# Patient Record
Sex: Female | Born: 1980 | Race: Black or African American | Hispanic: No | Marital: Single | Smoking: Never smoker
Health system: Southern US, Community
[De-identification: ages and names within clinical notes are randomized; demographics above are authoritative.]

## PROBLEM LIST (undated history)

## (undated) ENCOUNTER — Emergency Department (HOSPITAL_COMMUNITY): Admission: EM | Payer: BLUE CROSS/BLUE SHIELD | Source: Home / Self Care

## (undated) DIAGNOSIS — R011 Cardiac murmur, unspecified: Secondary | ICD-10-CM

## (undated) DIAGNOSIS — J329 Chronic sinusitis, unspecified: Secondary | ICD-10-CM

## (undated) DIAGNOSIS — J45909 Unspecified asthma, uncomplicated: Secondary | ICD-10-CM

## (undated) HISTORY — PX: TUBAL LIGATION: SHX77

## (undated) HISTORY — DX: Chronic sinusitis, unspecified: J32.9

## (undated) HISTORY — PX: CARPAL TUNNEL RELEASE: SHX101

## (undated) HISTORY — PX: OTHER SURGICAL HISTORY: SHX169

---

## 2000-10-08 ENCOUNTER — Ambulatory Visit (HOSPITAL_COMMUNITY): Admission: RE | Admit: 2000-10-08 | Discharge: 2000-10-08 | Payer: Self-pay | Admitting: *Deleted

## 2000-12-03 ENCOUNTER — Ambulatory Visit (HOSPITAL_COMMUNITY): Admission: RE | Admit: 2000-12-03 | Discharge: 2000-12-03 | Payer: Self-pay | Admitting: *Deleted

## 2000-12-27 ENCOUNTER — Encounter (INDEPENDENT_AMBULATORY_CARE_PROVIDER_SITE_OTHER): Payer: Self-pay

## 2000-12-27 ENCOUNTER — Inpatient Hospital Stay (HOSPITAL_COMMUNITY): Admission: AD | Admit: 2000-12-27 | Discharge: 2000-12-29 | Payer: Self-pay | Admitting: *Deleted

## 2002-03-04 ENCOUNTER — Emergency Department (HOSPITAL_COMMUNITY): Admission: EM | Admit: 2002-03-04 | Discharge: 2002-03-04 | Payer: Self-pay | Admitting: Emergency Medicine

## 2002-03-04 ENCOUNTER — Encounter: Payer: Self-pay | Admitting: Emergency Medicine

## 2004-03-22 ENCOUNTER — Emergency Department (HOSPITAL_COMMUNITY): Admission: EM | Admit: 2004-03-22 | Discharge: 2004-03-22 | Payer: Self-pay | Admitting: Emergency Medicine

## 2004-09-03 ENCOUNTER — Emergency Department (HOSPITAL_COMMUNITY): Admission: EM | Admit: 2004-09-03 | Discharge: 2004-09-03 | Payer: Self-pay | Admitting: Emergency Medicine

## 2005-02-20 ENCOUNTER — Emergency Department (HOSPITAL_COMMUNITY): Admission: EM | Admit: 2005-02-20 | Discharge: 2005-02-21 | Payer: Self-pay | Admitting: Emergency Medicine

## 2005-06-05 ENCOUNTER — Emergency Department (HOSPITAL_COMMUNITY): Admission: EM | Admit: 2005-06-05 | Discharge: 2005-06-05 | Payer: Self-pay | Admitting: Emergency Medicine

## 2005-08-06 ENCOUNTER — Emergency Department (HOSPITAL_COMMUNITY): Admission: EM | Admit: 2005-08-06 | Discharge: 2005-08-07 | Payer: Self-pay | Admitting: Emergency Medicine

## 2005-11-09 ENCOUNTER — Emergency Department (HOSPITAL_COMMUNITY): Admission: EM | Admit: 2005-11-09 | Discharge: 2005-11-09 | Payer: Self-pay | Admitting: Emergency Medicine

## 2006-07-30 ENCOUNTER — Emergency Department (HOSPITAL_COMMUNITY): Admission: EM | Admit: 2006-07-30 | Discharge: 2006-07-30 | Payer: Self-pay | Admitting: Emergency Medicine

## 2007-05-24 ENCOUNTER — Inpatient Hospital Stay (HOSPITAL_COMMUNITY): Admission: EM | Admit: 2007-05-24 | Discharge: 2007-05-26 | Payer: Self-pay | Admitting: Emergency Medicine

## 2007-05-26 ENCOUNTER — Ambulatory Visit: Payer: Self-pay | Admitting: Dentistry

## 2007-11-21 ENCOUNTER — Emergency Department (HOSPITAL_COMMUNITY): Admission: EM | Admit: 2007-11-21 | Discharge: 2007-11-21 | Payer: Self-pay | Admitting: Emergency Medicine

## 2008-09-26 ENCOUNTER — Emergency Department (HOSPITAL_COMMUNITY): Admission: EM | Admit: 2008-09-26 | Discharge: 2008-09-26 | Payer: Self-pay | Admitting: Emergency Medicine

## 2008-10-05 ENCOUNTER — Emergency Department (HOSPITAL_COMMUNITY): Admission: EM | Admit: 2008-10-05 | Discharge: 2008-10-05 | Payer: Self-pay | Admitting: Emergency Medicine

## 2009-02-27 ENCOUNTER — Emergency Department (HOSPITAL_COMMUNITY): Admission: EM | Admit: 2009-02-27 | Discharge: 2009-02-27 | Payer: Self-pay | Admitting: Emergency Medicine

## 2009-06-22 ENCOUNTER — Emergency Department (HOSPITAL_COMMUNITY): Admission: EM | Admit: 2009-06-22 | Discharge: 2009-06-23 | Payer: Self-pay | Admitting: Emergency Medicine

## 2009-12-31 ENCOUNTER — Emergency Department (HOSPITAL_COMMUNITY): Admission: EM | Admit: 2009-12-31 | Discharge: 2009-12-31 | Payer: Self-pay | Admitting: Emergency Medicine

## 2010-05-04 LAB — POCT I-STAT, CHEM 8
Calcium, Ion: 1.12 mmol/L (ref 1.12–1.32)
Chloride: 108 mEq/L (ref 96–112)
Creatinine, Ser: 1 mg/dL (ref 0.4–1.2)
Glucose, Bld: 104 mg/dL — ABNORMAL HIGH (ref 70–99)
Potassium: 4 mEq/L (ref 3.5–5.1)

## 2010-05-04 LAB — DIFFERENTIAL
Eosinophils Absolute: 0 10*3/uL (ref 0.0–0.7)
Lymphs Abs: 1.1 10*3/uL (ref 0.7–4.0)
Monocytes Absolute: 0.3 10*3/uL (ref 0.1–1.0)
Monocytes Relative: 10 % (ref 3–12)
Neutro Abs: 2 10*3/uL (ref 1.7–7.7)
Neutrophils Relative %: 59 % (ref 43–77)

## 2010-05-04 LAB — CBC
Hemoglobin: 13.3 g/dL (ref 12.0–15.0)
MCV: 75.6 fL — ABNORMAL LOW (ref 78.0–100.0)
RBC: 5.38 MIL/uL — ABNORMAL HIGH (ref 3.87–5.11)
WBC: 3.4 10*3/uL — ABNORMAL LOW (ref 4.0–10.5)

## 2010-05-24 LAB — URINE MICROSCOPIC-ADD ON

## 2010-05-24 LAB — URINALYSIS, ROUTINE W REFLEX MICROSCOPIC
Bilirubin Urine: NEGATIVE
Specific Gravity, Urine: 1.017 (ref 1.005–1.030)
pH: 5.5 (ref 5.0–8.0)

## 2010-05-24 LAB — RAPID STREP SCREEN (MED CTR MEBANE ONLY): Streptococcus, Group A Screen (Direct): NEGATIVE

## 2010-07-01 NOTE — Consult Note (Signed)
NAMEBRAILEY, BUESCHER               ACCOUNT NO.:  [id number]   MEDICAL RECORD NO.:  [id number]          PATIENT TYPE:  INP   LOCATION:  5011                         FACILITY:  [facility]   PHYSICIAN:  Charlynne Pander, D.D.S.DATE OF BIRTH:  1980-04-01   DATE OF CONSULTATION:  DATE OF DISCHARGE:  05/26/2007                                 CONSULTATION   Amy Guerrero is a 30 year old female referred by Dr. Flonnie Overman for dental  consultation.  The patient was recently admitted with a history of neck  swelling and some dysphagia.  Dental consultation requested to evaluate  toothache symptoms as well.  The patient is now seen as part of dental  consultation service to evaluate dentition and to rule out dental  infection that may affect the patient's systemic health.   MEDICAL HISTORY:  1. Right facial and neck swelling with a history of toothache.   ALLERGIES:  None known.   MEDICATIONS:  1. Aspirin 325 mg daily.  2. Chlorhexidine rinses twice daily.  3. Clindamycin 300 mg IV every 6 hours.  4. Lovenox 40 mg daily.  5. Protonix 40 mg daily.   SOCIAL HISTORY:  The patient is single.  The patient is a nonsmoker.  The patient with rare use of alcohol.   FAMILY HISTORY:  Mother is alive at age 82 with a history of diabetes  mellitus.  Father is alive at age 96 with a history of diabetes  mellitus.   FUNCTIONAL ASSESSMENT:  The patient was independent for ADLs prior to  this admission.   REVIEW OF SYSTEMS:  This was reviewed from the chart and health history  assessment form for this admission.   DENTAL HISTORY:   CHIEF COMPLAINT:  Dental consultation requested to evaluate mandibular  right toothache.   HISTORY OF PRESENT ILLNESS:  The patient with a history of toothache  symptoms that have been lasting for approximately 7 days.  The patient  was recently admitted on May 24, 2007 with the administration of IV  antibiotic therapy when dental consultation was requested.  The patient  is now seen to evaluate toothache symptoms and evaluate treatment  options at this time.   The patient gives a history of mandibular right third molar tooth pain  for approximately 7 days.  The patient indicates that it has been  hurting off and on for the past 1-2 years by her report.  The patient  indicates that recently the pain has been 10/10 in intensity, but is now  5-6/10 intensity with pain medication and IV antibiotic therapy.  The  patient indicates that she has not seen a dentist for a long time.  The patient indicates that it is approximately 5-10 years since she was  last seen by a dentist.   DENTAL EXAMINATION:  GENERAL:  The patient is a well-developed, well-  nourished female in no acute distress.  VITAL SIGNS:  Blood pressure is 115/72, pulse is 80, respirations are  20, temperature is 97.1.  HEAD AND NECK EXAM:  There is a right and left neck submandibular  lymphadenopathy.  The patient denies acute TMJ  symptoms.  The patient  does have limited opening with maximum internal incisal opening measured  at approximately 20 mm.  This has been a getting better over the past  several days of IV antibiotic therapy.  INTRAORAL EXAM:  The patient with a normal saliva.  There are some  plaque calculus accumulations noted.  There is no evidence of abscess  formation within the mouth.  DENTITION:  The patient has all remaining teeth present.  Tooth #1 one  and 16 have severe caries and are basically root segments.  PERIODONTAL:  The patient with chronic periodontitis with incipient bone  loss.  There is plaque and calculus accumulations that are noted.  No  significant tooth mobility is noted.  DENTAL CARIES:  There appears to be significant dental caries involving  teeth #1, #16, #17, and at 32.  I would need a full series dental  radiographs to identify the extent of the other incipient dental caries.  ENDODONTIC:  The patient with a history of acute pulpitis symptoms   involving the mandibular right third molar.  The patient appears to have  periapical pathology involving teeth #17 and #32 at this time.  The  patient currently is not complaining of toothache symptoms associated  with retained roots in the area of teeth #1 and #16.  CROWN AND BRIDGE:  There are no crown or bridge restorations.  PROATHODONTIC:  There are no dentures.  OCCLUSION:  The patient with a stable occlusion at this time.   Radiographic interpretation.  A panoramic x-ray was taken on May 25, 2007.  There are retained roots in the area of teeth #1 and #16 associated with  these third molars.  There is significant dental caries and noted to be  affecting teeth #1, #16, #17, and #32.  There is a significant  radiolucency at the apex of teeth #17 and #32.  There is incipient bone  loss.  There is a presence of tongue piercings with retained metal  objects noted on the ortho panoramic x-ray.   ASSESSMENT:  1. History of acute pulpitis symptoms involving the lower right      mandibular third molar #17.  2. Periapical pathology associated with teeth #17 and #32.  3. Retained roots of teeth #1 and #16 with extensive dental caries.  4. Significant dental caries involving teeth #1, #16, #17, and #32      with possible other incipient dental caries noted.  Full series of      dental radiographs is recommended by primary dentist of her choice.  5. Stable occlusion.  6. 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  7. Current IV antibiotic therapy, which appears to be resolving the      dental infection at this time.   PLAN/RECOMMENDATIONS:  1. I discussed risks, benefits, and complications of various treatment      option with the patient in relationship to her medical and dental      conditions.  Due to the complexity the case, I am referring the      patient to an oral surgeon for evaluation for extraction of  teeth      #1, #16, #17, and #32 as indicated.  The patient will then need to      follow up with a dentist of her choice for a comprehensive dental      exam, full series dental radiographs, and  overall treatment      planning discussion.  2. Discussion of findings with Dr. Flonnie Overman and assistance and      coordination of followup dental appointment with      Dr. Gwendlyn Deutscher (oral surgeon) at [phone].  The patient and      physician may contact Dr. Theora Gianotti office to assist in scheduling an      evaluation for third molar extractions for early in the week of      April 13 through the 17th.  Dr. Derek Mound office has been      contacted and we will assist in the scheduling as indicated.      Charlynne Pander, D.D.S.  Electronically Signed     RFK/MEDQ  D:  05/26/2007  T:  05/27/2007  Job:  [id number]   cc:   Lucita Ferrara, MD  Grant Ruts., D.D.S.

## 2010-07-01 NOTE — H&P (Signed)
Amy Guerrero, Amy Guerrero               ACCOUNT NO.:  [id number]   MEDICAL RECORD NO.:  [id number]          PATIENT TYPE:  EMS   LOCATION:  MAJO                         FACILITY:  [facility]   PHYSICIAN:  Beckey Rutter, MD  DATE OF BIRTH:  04-21-1980   DATE OF ADMISSION:  05/23/2007  DATE OF DISCHARGE:                              HISTORY & PHYSICAL   PRIMARY CARE PHYSICIAN:  Unassigned to [facility].   CHIEF COMPLAINT:  Neck swelling.   HISTORY OF PRESENT ILLNESS:  This is a 30 year old female with no  significant past medical history came in today to the emergency  department because of a toothache.  The patient had this problem for the  last 5 days.  It started with inability to open her mouth.  She has also  5 days of toothache.  The pain is mainly on the right lower third molar  tooth.  The patient felt this swelling and pain would go away, so she  did not consult or see any doctor for the last 5 days.  She noticed that  she is unable to eat mainly because she could not open her mouth, but  also because of pain when she swallows.  The patient now has pain when  swallowing her saliva.  The patient could not eat or drink sufficiently  for the last 5 days.  She was progressively feeling weak.  She denied  fever or headache.  She also denied nausea, vomiting or shortness of  breath.   PAST MEDICAL HISTORY:  No significant past medical history.   SOCIAL HISTORY:  Denied ethanol abuse, drug abuse or tobacco abuse.   FAMILY HISTORY:  Diabetes on the mother and the father's side.   MEDICATION ALLERGIES:  Not known to have medication allergies.   MEDICATION:  The patient is not taking any medication regularly.   REVIEW OF SYSTEMS:  A 12 point review of systems is noncontributory as  per HPI.   PHYSICAL EXAMINATION:  VITAL SIGNS:  Her temperature is 98.6, blood  pressure 127/82, pulse rate 79, respiratory rate is 16.  HEAD:  Atraumatic, normocephalic.  Eyes: PERRL.  Mouth:  The  patient  unable to open her mouth.  She had right submandibular swelling and  tenderness.  No enlarged or palpable lymph nodes in the anterior  cervical chain.  NECK:  Her neck is supple.  LUNGS:  Bilateral fair air entry.  PRECORDIUM:  First and second heart hearts sound audible.  No added  sound.  ABDOMEN: Soft, nontender.  Bowel sounds present.  EXTREMITIES:  No lower extremity edema.  NEUROLOGICALLY:  Alert and oriented x3.   LABS AND X-RAYS:  Her white count is 7.2, hemoglobin is 12.0, hematocrit  is 36.1, platelet count is 256.  CT head/neck x-ray was showing focal  inflammatory process at the level of the body of the right mandible.  There is no abscessed tooth or evidence for osteomyelitis.  Her sodium  is 137, potassium 3.6, chloride 102, bicarb 27, glucose 84, BUN 7,  creatinine 0.8.   ASSESSMENT AND PLAN:  This is 30 year old female with  focal inflammatory  process, but no abscess in the body of the right mandible.   PLAN:  1. The patient will be started on antibiotic.  2. Will consider dental evaluation when the patient is stable.  3. The patient will have IV fluids for hydration until able to open      her mouth and take regular food.  4. The patient will have Lovenox for DVT prophylaxis and Protonix for      GI prophylaxis.      Beckey Rutter, MD  Electronically Signed     EME/MEDQ  D:  05/24/2007  T:  05/24/2007  Job:  [id number]

## 2010-07-01 NOTE — Discharge Summary (Signed)
NAMECATHYE, Amy Guerrero               ACCOUNT NO.:  [id number]   MEDICAL RECORD NO.:  [id number]          PATIENT TYPE:  INP   LOCATION:  5011                         FACILITY:  [facility]   PHYSICIAN:  Lucita Ferrara, MD         DATE OF BIRTH:  [birth year]   DATE OF ADMISSION:  05/23/2007  DATE OF DISCHARGE:  05/26/2007                               DISCHARGE SUMMARY   DISCHARGE DIAGNOSES:  1. Periapical abscess.  2. Periapical lucency on #17 and #32.  3. Bilateral lower wisdom tooth pain.  4. Decreased p.o. intake secondary to numbers above.   PROCEDURES:  The patient had an orthopantogram dated May 25, 2007 which  showed results described above.   CONSULTATIONS:  Dr. Kristin Bruins with Dental Services.  Dr. Manson Passey with Oral Surgery.   BRIEF HISTORY OF PRESENT ILLNESS:  Ms. Marrin is a 30 year old female  who presented [facility] on May 24, 2007, with chief  complaint of intractable jaw pain and inability to chew her food and  open her mouth and decreased p.o. intake with solids or liquids.  She  denied any fevers, chills, nausea, vomiting.  She was admitted to the  medical and surgical floor for decreased p.o. intake and dehydration.  Given the underlying cause was her dental infection, a dentistry  consultation was requested.  Dr. Kristin Bruins came and it was apparent that  she had apical teeth abscess.  I spoke with Dr. Manson Passey also from oral  surgery on the phone who requested the patient come to his office.  She  has an appointment later on today around 3:00.  She will be seeing him  for further treatment including possible extractions.   DISCHARGE MEDICATIONS:  Clindamycin 150 mg every 6 hours for a total of  seven days.   DISCHARGE INSTRUCTIONS:  Diet:  She is to continue on soft diet and to  keep herself well-hydrated .   CONDITION ON DISCHARGE:  Stable.      Lucita Ferrara, MD  Electronically Signed     RR/MEDQ  D:  05/26/2007  T:  05/26/2007  Job:  [id number]

## 2010-07-04 NOTE — Op Note (Signed)
[facility]  Patient:    Amy Guerrero, Amy Guerrero Visit Number: [id number] MRN: [id number]          Service Type: OBS Location: 9300 [id number] Attending Physician:  Michaelle Copas Dictated by:   Ed Blalock. Burnadette Peter, M.D. Proc. Date: 12/28/00 Admit Date:  12/27/2000                             Operative Report  DATE OF BIRTH:                1980-04-25  PREOPERATIVE DIAGNOSES:       Multiparity, desires permanent sterilization.  POSTOPERATIVE DIAGNOSES:      Multiparity, desires permanent sterilization.  PROCEDURE:                    Postpartum tubal ligation.  SURGEON:                      Ed Blalock. Burnadette Peter, M.D.  ASSISTANT:                    Conni Elliot, M.D.  ANESTHESIA:                   General endotracheal.  COMPLICATIONS:                None.  ESTIMATED BLOOD LOSS:         Minimal.  FLUIDS:                       500 cc LR.  INDICATIONS:                  This is a 30 year old G4, now P4 postpartum day #1 status post a normal spontaneous vaginal delivery who desires permanent sterilization.  Risks, benefits of the procedure were discussed with the patient including risk of failure with increased risk of ectopic if pregnancy occurs.  FINDINGS:                     Normal uterus, tubes, and ovaries.  PROCEDURE:                    The patient was taken to the operating room and placed under general endotracheal anesthesia.  A small transverse infraumbilical skin incision was then made with the scalpel.  Incision was carried down through the underlying fascia until the peritoneum was identified and entered.  The peritoneum was noted to be free of any adhesions then the incision was extended with the Metzenbaum scissors.  The patients left fallopian tube was then identified, brought to the incision, and grasped with Babcock clamp.  The tube was followed out to the fimbria.  The Babcock clamp was then used to grasp the tube  approximately 4 cm from the cornual region.  A 3 cm segment of tube was then ligated with an SH needle in a #1 tie and excised.  Good hemostasis was noted and tube was returned to the abdomen.  The right fallopian tube was then ligated and 3 cm segment excised in a similar fashion.  Excellent hemostasis was noted and the tube returned to the abdomen.  The peritoneum and fascia were then closed in routine fashion.  The skin was closed in subcuticular fashion.  The patient tolerated procedure well.  Sponge, lap, and needle counts were  correct x 2.  The patient was taken to the recovery room in stable condition.  PATHOLOGY:                    Segments of right and left fallopian tubes were sent. Dictated by:   Ed Blalock. Burnadette Peter, M.D. Attending Physician:  Michaelle Copas DD:  12/28/00 TD:  12/28/00 Job: [id number]

## 2010-11-03 ENCOUNTER — Emergency Department (HOSPITAL_COMMUNITY): Payer: Self-pay

## 2010-11-03 ENCOUNTER — Emergency Department (HOSPITAL_COMMUNITY)
Admission: EM | Admit: 2010-11-03 | Discharge: 2010-11-03 | Disposition: A | Discharge status: Home / Self Care | Payer: Self-pay | Attending: Emergency Medicine | Admitting: Emergency Medicine

## 2010-11-03 DIAGNOSIS — H05019 Cellulitis of unspecified orbit: Secondary | ICD-10-CM | POA: Insufficient documentation

## 2010-11-03 DIAGNOSIS — H5710 Ocular pain, unspecified eye: Secondary | ICD-10-CM | POA: Insufficient documentation

## 2010-11-03 MED ORDER — IOHEXOL 300 MG/ML  SOLN
80.0000 mL | Freq: Once | INTRAMUSCULAR | Status: AC | PRN
  Administered 2010-11-03: 80 mL via INTRAVENOUS

## 2010-11-09 ENCOUNTER — Emergency Department (HOSPITAL_COMMUNITY): Payer: Self-pay

## 2010-11-09 ENCOUNTER — Emergency Department (HOSPITAL_COMMUNITY)
Admission: EM | Admit: 2010-11-09 | Discharge: 2010-11-09 | Disposition: A | Discharge status: Home / Self Care | Payer: Self-pay | Attending: Emergency Medicine | Admitting: Emergency Medicine

## 2010-11-09 DIAGNOSIS — W2203XA Walked into furniture, initial encounter: Secondary | ICD-10-CM | POA: Insufficient documentation

## 2010-11-09 DIAGNOSIS — M25579 Pain in unspecified ankle and joints of unspecified foot: Secondary | ICD-10-CM | POA: Insufficient documentation

## 2010-11-11 LAB — CBC
MCHC: 33
MCHC: 33.8
MCV: 77.6 — ABNORMAL LOW
MCV: 78
MCV: 78.1
Platelets: 201
Platelets: 221
Platelets: 256
WBC: 6.5
WBC: 7.5

## 2010-11-11 LAB — COMPREHENSIVE METABOLIC PANEL
AST: 13
AST: 13
Albumin: 2.7 — ABNORMAL LOW
Albumin: 2.9 — ABNORMAL LOW
BUN: 1 — ABNORMAL LOW
CO2: 30
Calcium: 8.3 — ABNORMAL LOW
Calcium: 8.7
Chloride: 105
Creatinine, Ser: 0.85
Creatinine, Ser: 0.85
GFR calc Af Amer: >60
GFR calc Af Amer: >60
GFR calc non Af Amer: >60

## 2010-11-11 LAB — DIFFERENTIAL
Eosinophils Absolute: 0
Eosinophils Relative: 0
Lymphocytes Relative: 16
Lymphocytes Relative: 18
Lymphs Abs: 1.2
Lymphs Abs: 1.2
Monocytes Absolute: 0.2
Monocytes Relative: 3
Neutrophils Relative %: 76

## 2010-11-11 LAB — BASIC METABOLIC PANEL
BUN: 7
Calcium: 8.6
Creatinine, Ser: 0.88
GFR calc non Af Amer: >60

## 2010-11-11 LAB — WOUND CULTURE: Gram Stain: NONE SEEN

## 2010-11-11 LAB — CULTURE, BLOOD (ROUTINE X 2)

## 2010-12-04 LAB — CBC
HCT: 36.1
Hemoglobin: 11.7 — ABNORMAL LOW
MCV: 76.1 — ABNORMAL LOW
RBC: 4.75
WBC: 4.2

## 2010-12-04 LAB — DIFFERENTIAL
Eosinophils Absolute: 0
Eosinophils Relative: 0
Lymphs Abs: 1.1
Monocytes Absolute: 0.2
Monocytes Relative: 6

## 2010-12-04 LAB — BASIC METABOLIC PANEL
CO2: 24
Chloride: 105
GFR calc Af Amer: >60
Potassium: 3.3 — ABNORMAL LOW

## 2010-12-04 LAB — ACETAMINOPHEN LEVEL

## 2010-12-04 LAB — RAPID URINE DRUG SCREEN, HOSP PERFORMED
Amphetamines: NOT DETECTED
Barbiturates: NOT DETECTED
Benzodiazepines: NOT DETECTED
Cocaine: NOT DETECTED
Opiates: NOT DETECTED
Tetrahydrocannabinol: POSITIVE — AB

## 2010-12-04 LAB — ETHANOL: Alcohol, Ethyl (B): <5

## 2012-02-24 ENCOUNTER — Emergency Department (HOSPITAL_COMMUNITY)
Admission: EM | Admit: 2012-02-24 | Discharge: 2012-02-24 | Disposition: A | Discharge status: Home / Self Care | Payer: Self-pay | Attending: Emergency Medicine | Admitting: Emergency Medicine

## 2012-02-24 ENCOUNTER — Encounter (HOSPITAL_COMMUNITY): Payer: Self-pay | Admitting: *Deleted

## 2012-02-24 DIAGNOSIS — B029 Zoster without complications: Secondary | ICD-10-CM | POA: Insufficient documentation

## 2012-02-24 DIAGNOSIS — F1210 Cannabis abuse, uncomplicated: Secondary | ICD-10-CM | POA: Insufficient documentation

## 2012-02-24 MED ORDER — VALACYCLOVIR HCL 1 G PO TABS: 1000.0000 mg | ORAL_TABLET | Freq: Three times a day (TID) | ORAL | Status: AC

## 2012-02-24 MED ORDER — PREDNISONE 50 MG PO TABS: 50.0000 mg | ORAL_TABLET | Freq: Every day | ORAL | Status: DC

## 2012-02-24 NOTE — ED Notes (Signed)
Pt has 3 small bumps noted to back. Pt states "I think I have ringworm." rash does not appear circular. Pt states rash is itchy and burning.

## 2012-02-24 NOTE — ED Notes (Signed)
Rx too expensive Patient requested change : per [employer] lawyer Change to Acyclovir 800 mg 5 times a day for 7 days called to [facility] on [address] by [employer] [facility]

## 2012-02-24 NOTE — ED Provider Notes (Signed)
History     CSN: [id number]  Arrival date & time 02/24/12  1155   First MD Initiated Contact with Patient 02/24/12 1217      Chief Complaint  Patient presents with  . Rash    (Consider location/radiation/quality/duration/timing/severity/associated sxs/prior treatment) Patient is a 32 y.o. female presenting with rash.  Rash     Patient presents to the emergency department complaining of a rash that has been present for 1 week on her back. The rash is itchy, burning, and irritated, located where her bra sits. 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 She also has a dog, whom she bathed the day after she noticed her rash, thinking it may be fleas. She has not changed her body wash or her laundry detergent, and has not recently gotten a new bra. No known allergies.   History reviewed. No pertinent past medical history.  No past surgical history on file.  No family history on file.  History  Substance Use Topics  . Smoking status: Not on file  . Smokeless tobacco: Not on file  . Alcohol Use: Yes    OB History    Grav Para Term Preterm Abortions TAB SAB Ect Mult Living                  Review of Systems  Skin: Positive for rash.  All other systems negative except as documented in the HPI. All pertinent positives and negatives as reviewed in the HPI.   Allergies  Review of patient's allergies indicates no known allergies.  Home Medications  No current outpatient prescriptions on file.  BP 126/81  Pulse 83  Temp 99.3 F (37.4 C) (Oral)  Resp 16  SpO2 100%  Physical Exam  Constitutional: She is oriented to person, place, and time. She appears well-developed and well-nourished.  Neurological: She is alert and oriented to person, place, and time.  Skin: Skin is warm and dry. Rash noted. Rash is papular and nodular. There is erythema.       ED Course  Procedures (including critical care time)  The patient has a rash that is not classic for shingles but  she has burning and itching and there is scabbed over areas. The patient is advised of our considerations for this rash and voices an understanding.    MDM          Carlyle Dolly, PA-C 02/25/12 [id number]

## 2012-02-24 NOTE — ED Notes (Signed)
Voiced understanding of instructions given 

## 2012-02-26 NOTE — ED Provider Notes (Signed)
Medical screening examination/treatment/procedure(s) were performed by non-physician practitioner and as supervising physician I was immediately available for consultation/collaboration.    Nelia Shi, MD 02/26/12 1051

## 2012-08-19 ENCOUNTER — Emergency Department (HOSPITAL_COMMUNITY): Payer: Self-pay

## 2012-08-19 ENCOUNTER — Emergency Department (HOSPITAL_COMMUNITY)
Admission: EM | Admit: 2012-08-19 | Discharge: 2012-08-19 | Disposition: A | Discharge status: Home / Self Care | Payer: Self-pay | Attending: Emergency Medicine | Admitting: Emergency Medicine

## 2012-08-19 ENCOUNTER — Encounter (HOSPITAL_COMMUNITY): Payer: Self-pay | Admitting: Emergency Medicine

## 2012-08-19 DIAGNOSIS — J069 Acute upper respiratory infection, unspecified: Secondary | ICD-10-CM | POA: Insufficient documentation

## 2012-08-19 DIAGNOSIS — R6883 Chills (without fever): Secondary | ICD-10-CM | POA: Insufficient documentation

## 2012-08-19 DIAGNOSIS — H6692 Otitis media, unspecified, left ear: Secondary | ICD-10-CM

## 2012-08-19 DIAGNOSIS — H6690 Otitis media, unspecified, unspecified ear: Secondary | ICD-10-CM | POA: Insufficient documentation

## 2012-08-19 DIAGNOSIS — R062 Wheezing: Secondary | ICD-10-CM | POA: Insufficient documentation

## 2012-08-19 DIAGNOSIS — J029 Acute pharyngitis, unspecified: Secondary | ICD-10-CM | POA: Insufficient documentation

## 2012-08-19 DIAGNOSIS — R059 Cough, unspecified: Secondary | ICD-10-CM | POA: Insufficient documentation

## 2012-08-19 DIAGNOSIS — F17200 Nicotine dependence, unspecified, uncomplicated: Secondary | ICD-10-CM | POA: Insufficient documentation

## 2012-08-19 DIAGNOSIS — R05 Cough: Secondary | ICD-10-CM | POA: Insufficient documentation

## 2012-08-19 DIAGNOSIS — J3489 Other specified disorders of nose and nasal sinuses: Secondary | ICD-10-CM | POA: Insufficient documentation

## 2012-08-19 LAB — RAPID STREP SCREEN (MED CTR MEBANE ONLY): Streptococcus, Group A Screen (Direct): NEGATIVE

## 2012-08-19 MED ORDER — AMOXICILLIN 500 MG PO CAPS: 500.0000 mg | ORAL_CAPSULE | Freq: Two times a day (BID) | ORAL | Status: DC

## 2012-08-19 MED ORDER — ALBUTEROL SULFATE HFA 108 (90 BASE) MCG/ACT IN AERS
2.0000 | INHALATION_SPRAY | Freq: Once | RESPIRATORY_TRACT | Status: AC
  Administered 2012-08-19: 2 via RESPIRATORY_TRACT
  Filled 2012-08-19: qty 6.7

## 2012-08-19 NOTE — ED Provider Notes (Signed)
History    CSN: [id number] Arrival date & time 08/19/12  1255  First MD Initiated Contact with Patient 08/19/12 1333     Chief Complaint  Patient presents with  . Otalgia    l/ear pain x 1 week  . Sore Throat   (Consider location/radiation/quality/duration/timing/severity/associated sxs/prior Treatment) The history is provided by the patient. No language interpreter was used.  Amy Guerrero is a 32 y/o F presenting to the ED with otalgia and sore throat. Patient reported that otalgia is to the left ear that started last night and sore throat started approximately 8-9 days ago. Stated that the throat is swollen, intermittent pain that gets worse when she swallows food and fluids. Patient reported that she has intermittent left ear pain, described as a sharp pain - nothing makes the pain better or worse. Stated that she has been using Claritin and Benadryl with minimal relief. No radiation of discomfort. Reported associated symptoms of cough, clear nasal congestion, chills. Denied fever, sweats, otorrhea, back pain, neck pain, gi symptoms, urinary symptoms, difficulty swallowing, swimming in the pool, recent beach trip.    History reviewed. No pertinent past medical history. History reviewed. No pertinent past surgical history. Family History  Problem Relation Age of Onset  . Diabetes Mother   . Diabetes Father    History  Substance Use Topics  . Smoking status: Current Every Day Smoker    Types: Cigarettes  . Smokeless tobacco: Not on file  . Alcohol Use: Yes   OB History   Grav Para Term Preterm Abortions TAB SAB Ect Mult Living                 Review of Systems  Constitutional: Positive for chills. Negative for fever.  HENT: Positive for ear pain, congestion, sore throat and rhinorrhea. Negative for trouble swallowing, neck pain and neck stiffness.   Eyes: Negative for pain and visual disturbance.  Respiratory: Positive for cough. Negative for chest tightness and shortness  of breath.   Cardiovascular: Negative for chest pain.  Gastrointestinal: Negative for nausea, vomiting, abdominal pain and diarrhea.  Genitourinary: Negative for decreased urine volume.  Neurological: Negative for weakness and numbness.    Allergies  Review of patient's allergies indicates no known allergies.  Home Medications   Current Outpatient Rx  Name  Route  Sig  Dispense  Refill  . amoxicillin (AMOXIL) 500 MG capsule   Oral   Take 1 capsule (500 mg total) by mouth 2 (two) times daily.   20 capsule   0    BP 112/77  Pulse 99  Temp(Src) 98.8 F (37.1 C) (Oral)  Wt 160 lb (72.576 kg)  SpO2 100%  LMP 08/10/2012 Physical Exam  Nursing note and vitals reviewed. Constitutional: She is oriented to person, place, and time. She appears well-developed and well-nourished. No distress.  HENT:  Head: Normocephalic and atraumatic.  Right Ear: Hearing, tympanic membrane, external ear and ear canal normal.  Left Ear: Hearing normal. There is tenderness. No drainage or swelling. Tympanic membrane is erythematous and bulging. No decreased hearing is noted.  Mouth/Throat: Oropharynx is clear and moist. No oropharyngeal exudate.  Left ear: negative swelling to the ear canal. Mild erythema noted to ear canal. TM bulging with hazy fluid accumulation, TM erythematous.   Eyes: Conjunctivae and EOM are normal. Pupils are equal, round, and reactive to light. Right eye exhibits no discharge. Left eye exhibits no discharge.  Neck: Normal range of motion. Neck supple.  Negative lymphadenopathy  Cardiovascular: Normal rate, regular rhythm and normal heart sounds.  Exam reveals no friction rub.   No murmur heard. Pulses:      Radial pulses are 2+ on the right side, and 2+ on the left side.  Pulmonary/Chest: Effort normal. No respiratory distress. She has wheezes in the right middle field and the left lower field.  Lymphadenopathy:    She has no cervical adenopathy.  Neurological: She is alert  and oriented to person, place, and time. No cranial nerve deficit. She exhibits normal muscle tone. Coordination normal.  Skin: Skin is warm and dry. No rash noted. She is not diaphoretic. No erythema.  Psychiatric: She has a normal mood and affect. Her behavior is normal. Thought content normal.    ED Course  Procedures (including critical care time) Labs Reviewed  RAPID STREP SCREEN  CULTURE, GROUP A STREP   Dg Chest 2 View  08/19/2012   *RADIOLOGY REPORT*  Clinical Data: Sore throat.  CHEST - 2 VIEW  Comparison: 02/27/2009  Findings: Two views of the chest demonstrate clear lungs. Heart and mediastinum are within normal limits.  The trachea is midline. Bony thorax is intact.  IMPRESSION: No acute cardiopulmonary disease.   Original Report Authenticated By: Richarda Overlie, M.D.   1. URI (upper respiratory infection)   2. Otitis media, left     MDM  Patient presenting to the ED with sore throat and left otalgia.  Left ear erythematous TM, bulging to the TM noted with accumulation of hazy fluid. Mild discomfort with pulling and tugging at left ear. Right ear normal. Negative erythema to posterior oropharynx, negative exudate, negative petechiae, negative tonsillar swelling. Mild expiratory wheezes auscultated to bilateral lobes. Doubt mastoiditis. Rapid strep negative. Chest xray negative findings. Suspicion to be otitis media to the left ear, viral URI. Patient stable, afebrile. Discharged patient with antibiotics and albuterol inhaler - educated patient on how to use inhaler. Discussed with patient to rest and stay hydrated. [facility]uated by next week. Discussed with patient to continue to monitor symptoms and if symptoms are to worsen or change to report back to the ED - strict return instructions given. Patient agreed to plan of care, understood, all questions answered.   Raymon Mutton, PA-C 08/19/12 1733

## 2012-08-19 NOTE — ED Provider Notes (Signed)
Medical screening examination/treatment/procedure(s) were performed by non-physician practitioner and as supervising physician I was immediately available for consultation/collaboration.   Nelia Shi, MD 08/19/12 2229

## 2012-08-19 NOTE — ED Notes (Signed)
Pt reports one week hx of sore throat and 24 hx of l/ear pain

## 2012-08-21 LAB — CULTURE, GROUP A STREP

## 2012-10-29 ENCOUNTER — Emergency Department (HOSPITAL_COMMUNITY): Payer: Self-pay

## 2012-10-29 ENCOUNTER — Encounter (HOSPITAL_COMMUNITY): Payer: Self-pay | Admitting: *Deleted

## 2012-10-29 ENCOUNTER — Emergency Department (HOSPITAL_COMMUNITY)
Admission: EM | Admit: 2012-10-29 | Discharge: 2012-10-29 | Disposition: A | Discharge status: Home / Self Care | Payer: Self-pay | Attending: Emergency Medicine | Admitting: Emergency Medicine

## 2012-10-29 DIAGNOSIS — F17200 Nicotine dependence, unspecified, uncomplicated: Secondary | ICD-10-CM | POA: Insufficient documentation

## 2012-10-29 DIAGNOSIS — Y9389 Activity, other specified: Secondary | ICD-10-CM | POA: Insufficient documentation

## 2012-10-29 DIAGNOSIS — Z79899 Other long term (current) drug therapy: Secondary | ICD-10-CM | POA: Insufficient documentation

## 2012-10-29 DIAGNOSIS — S46909A Unspecified injury of unspecified muscle, fascia and tendon at shoulder and upper arm level, unspecified arm, initial encounter: Secondary | ICD-10-CM | POA: Insufficient documentation

## 2012-10-29 DIAGNOSIS — X500XXA Overexertion from strenuous movement or load, initial encounter: Secondary | ICD-10-CM | POA: Insufficient documentation

## 2012-10-29 DIAGNOSIS — S4992XA Unspecified injury of left shoulder and upper arm, initial encounter: Secondary | ICD-10-CM

## 2012-10-29 DIAGNOSIS — Y9289 Other specified places as the place of occurrence of the external cause: Secondary | ICD-10-CM | POA: Insufficient documentation

## 2012-10-29 DIAGNOSIS — S4980XA Other specified injuries of shoulder and upper arm, unspecified arm, initial encounter: Secondary | ICD-10-CM | POA: Insufficient documentation

## 2012-10-29 MED ORDER — CYCLOBENZAPRINE HCL 10 MG PO TABS: 10.0000 mg | ORAL_TABLET | Freq: Two times a day (BID) | ORAL | Status: DC | PRN

## 2012-10-29 MED ORDER — HYDROCODONE-ACETAMINOPHEN 5-325 MG PO TABS: 1.0000 | ORAL_TABLET | ORAL | Status: DC | PRN

## 2012-10-29 MED ORDER — IBUPROFEN 800 MG PO TABS: 800.0000 mg | ORAL_TABLET | Freq: Three times a day (TID) | ORAL | Status: DC
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
## 2012-11-06 NOTE — ED Provider Notes (Signed)
Medical screening examination/treatment/procedure(s) were performed by non-physician practitioner and as supervising physician I was immediately available for consultation/collaboration.  Shon Baton, MD 11/06/12 2033

## 2012-11-06 NOTE — ED Provider Notes (Signed)
CSN: [id number]     Arrival date & time 10/29/12  1030 History   First MD Initiated Contact with Patient 10/29/12 1053     Chief Complaint  Patient presents with  . Shoulder Injury   (Consider location/radiation/quality/duration/timing/severity/associated sxs/prior Treatment) Patient is a 32 y.o. female presenting with shoulder injury. The history is provided by the patient. No language interpreter was used.  Shoulder Injury This is a new problem. Pertinent negatives include no chills or fever. Associated symptoms comments: She complains of left shoulder pain that started after trying to stop her son from running. No fall or direct blow. She reports greatest pain with movement of arm away from her body. No redness or swelling noted. Marland Kitchen    History reviewed. No pertinent past medical history. History reviewed. No pertinent past surgical history. Family History  Problem Relation Age of Onset  . Diabetes Mother   . Diabetes Father    History  Substance Use Topics  . Smoking status: Current Every Day Smoker    Types: Cigarettes  . Smokeless tobacco: Not on file  . Alcohol Use: Yes   OB History   Grav Para Term Preterm Abortions TAB SAB Ect Mult Living                 Review of Systems  Constitutional: Negative for fever and chills.  HENT: Negative.   Respiratory: Negative.   Cardiovascular: Negative.   Gastrointestinal: Negative.   Musculoskeletal:       See HPI.  Skin: Negative.   Neurological: Negative.     Allergies  Review of patient's allergies indicates no known allergies.  Home Medications   Current Outpatient Rx  Name  Route  Sig  Dispense  Refill  . albuterol (PROVENTIL HFA;VENTOLIN HFA) 108 (90 BASE) MCG/ACT inhaler   Inhalation   Inhale 2 puffs into the lungs every 6 (six) hours as needed for wheezing.         . cyclobenzaprine (FLEXERIL) 10 MG tablet   Oral   Take 1 tablet (10 mg total) by mouth 2 (two) times daily as needed for muscle spasms.   20  tablet   0   . HYDROcodone-acetaminophen (NORCO/VICODIN) 5-325 MG per tablet   Oral   Take 1 tablet by mouth every 4 (four) hours as needed for pain.   6 tablet   0   . ibuprofen (ADVIL,MOTRIN) 800 MG tablet   Oral   Take 1 tablet (800 mg total) by mouth 3 (three) times daily.   21 tablet   0    BP 127/72  Pulse 82  Temp(Src) 98.5 F (36.9 C) (Oral)  Resp 16  Wt 158 lb (71.668 kg)  SpO2 100%  LMP 09/26/2012 Physical Exam  Constitutional: She is oriented to person, place, and time. She appears well-developed and well-nourished.  Neck: Normal range of motion.  Cardiovascular: Intact distal pulses.   Pulmonary/Chest: Effort normal.  Musculoskeletal:  Left shoulder tender over deltoid without swelling. No bony deformity, including no AC drop off. No palpable dislocation. No redness.  Neurological: She is alert and oriented to person, place, and time.  Skin: Skin is warm and dry.    ED Course  Procedures (including critical care time) Labs Review Labs Reviewed - No data to display Imaging Review No results found.  MDM   1. Shoulder injury, left, initial encounter    Presentation and exam support muscular strain injury, possibly bursitis. Will treat symptomatically.    Arnoldo Hooker, PA-C 11/06/12  1709 

## 2013-01-12 ENCOUNTER — Encounter (HOSPITAL_COMMUNITY): Payer: Self-pay | Admitting: Emergency Medicine

## 2013-01-12 ENCOUNTER — Emergency Department (HOSPITAL_COMMUNITY)
Admission: EM | Admit: 2013-01-12 | Discharge: 2013-01-12 | Disposition: A | Discharge status: Home / Self Care | Payer: Self-pay | Attending: Emergency Medicine | Admitting: Emergency Medicine

## 2013-01-12 DIAGNOSIS — I10 Essential (primary) hypertension: Secondary | ICD-10-CM | POA: Insufficient documentation

## 2013-01-12 DIAGNOSIS — Y939 Activity, unspecified: Secondary | ICD-10-CM | POA: Insufficient documentation

## 2013-01-12 DIAGNOSIS — Z720 Tobacco use: Secondary | ICD-10-CM

## 2013-01-12 DIAGNOSIS — S1096XA Insect bite of unspecified part of neck, initial encounter: Secondary | ICD-10-CM | POA: Insufficient documentation

## 2013-01-12 DIAGNOSIS — Y929 Unspecified place or not applicable: Secondary | ICD-10-CM | POA: Insufficient documentation

## 2013-01-12 DIAGNOSIS — J45909 Unspecified asthma, uncomplicated: Secondary | ICD-10-CM | POA: Insufficient documentation

## 2013-01-12 DIAGNOSIS — J9801 Acute bronchospasm: Secondary | ICD-10-CM

## 2013-01-12 DIAGNOSIS — F17200 Nicotine dependence, unspecified, uncomplicated: Secondary | ICD-10-CM | POA: Insufficient documentation

## 2013-01-12 DIAGNOSIS — W57XXXA Bitten or stung by nonvenomous insect and other nonvenomous arthropods, initial encounter: Secondary | ICD-10-CM | POA: Insufficient documentation

## 2013-01-12 DIAGNOSIS — Z79899 Other long term (current) drug therapy: Secondary | ICD-10-CM | POA: Insufficient documentation

## 2013-01-12 HISTORY — DX: Unspecified asthma, uncomplicated: J45.909

## 2013-01-12 MED ORDER — ALBUTEROL SULFATE HFA 108 (90 BASE) MCG/ACT IN AERS
2.0000 | INHALATION_SPRAY | RESPIRATORY_TRACT | Status: DC | PRN
  Administered 2013-01-12: 2 via RESPIRATORY_TRACT
  Filled 2013-01-12: qty 6.7

## 2013-01-12 MED ORDER — FAMOTIDINE 20 MG PO TABS
20.0000 mg | ORAL_TABLET | Freq: Once | ORAL | Status: AC
  Administered 2013-01-12: 20 mg via ORAL
  Filled 2013-01-12: qty 1

## 2013-01-12 MED ORDER — AEROCHAMBER Z-STAT PLUS/MEDIUM MISC
1.0000 | Freq: Once | Status: AC
  Administered 2013-01-12: 1

## 2013-01-12 MED ORDER — DIPHENHYDRAMINE HCL 25 MG PO CAPS
50.0000 mg | ORAL_CAPSULE | Freq: Once | ORAL | Status: AC
  Administered 2013-01-12: 50 mg via ORAL
  Filled 2013-01-12: qty 2

## 2013-01-12 NOTE — ED Notes (Signed)
Per pt reports insect bite since Tuesday with worsening itching to right hand and left side of forehead. Pt reports months of SOB related to hx of asthma. Pt lungs sounds clear with auscultation. Pt denies pain at present time.

## 2013-01-12 NOTE — Progress Notes (Signed)
MDI was gave to pt with spacer. Pt knows how to use.

## 2013-01-12 NOTE — ED Provider Notes (Signed)
CSN: [id number]     Arrival date & time 01/12/13  0865 History   First MD Initiated Contact with Patient 01/12/13 [phone]     Chief Complaint  Patient presents with  . Insect Bite  . Asthma   (Consider location/radiation/quality/duration/timing/severity/associated sxs/prior Treatment) HPI Comments: Amy Guerrero is a 32 y.o. female presents for evaluation of sore spots on left hand, and right forehead. This started 2 days ago after awakening. She has developed swelling of the left hand since then. There is no swelling of the face, or oropharynx. She denies fever, chills, nausea, vomiting, weakness, or dizziness. She has not tried any medication for the problem. She complains of chronic shortness of breath for several months. It is worse with exertion. She smokes cigarettes and marijuana. There are no other known modifying factors.   Patient is a 32 y.o. female presenting with asthma. The history is provided by the patient.  Asthma    Past Medical History  Diagnosis Date  . Asthma    History reviewed. No pertinent past surgical history. Family History  Problem Relation Age of Onset  . Diabetes Mother   . Diabetes Father    History  Substance Use Topics  . Smoking status: Current Every Day Smoker -- 0.50 packs/day    Types: Cigarettes  . Smokeless tobacco: Not on file  . Alcohol Use: Yes   OB History   Grav Para Term Preterm Abortions TAB SAB Ect Mult Living                 Review of Systems  All other systems reviewed and are negative.    Allergies  Review of patient's allergies indicates no known allergies.  Home Medications   Current Outpatient Rx  Name  Route  Sig  Dispense  Refill  . albuterol (PROVENTIL HFA;VENTOLIN HFA) 108 (90 BASE) MCG/ACT inhaler   Inhalation   Inhale 2 puffs into the lungs every 6 (six) hours as needed for wheezing.          BP 122/83  Pulse 73  Temp(Src) 98.3 F (36.8 C) (Oral)  Resp 20  SpO2 100%  LMP 01/02/2013 Physical  Exam  Nursing note and vitals reviewed. Constitutional: She is oriented to person, place, and time. She appears well-developed and well-nourished.  HENT:  Head: Normocephalic and atraumatic.  Eyes: Conjunctivae and EOM are normal. Pupils are equal, round, and reactive to light.  Neck: Normal range of motion and phonation normal. Neck supple.  Cardiovascular: Normal rate, regular rhythm and intact distal pulses.   Pulmonary/Chest: Effort normal. She exhibits no tenderness.  Mild decreased airflow bilaterally without wheezes, rales, or rhonchi  Abdominal: Soft. She exhibits no distension. There is no tenderness. There is no guarding.  Musculoskeletal: Normal range of motion.  Mild swelling of the dorsum of the left hand. Normal range of motion of the left hand, wrist, elbow and shoulder  Neurological: She is alert and oriented to person, place, and time. She exhibits normal muscle tone.  Skin: Skin is warm and dry.  Indurated papule, left ulnar hand, and right forehead; these lesions are approximately 1.0 cm in diameter, and are nontender to palpation.  Psychiatric: She has a normal mood and affect. Her behavior is normal. Judgment and thought content normal.    ED Course  Procedures (including critical care time)  Medications  albuterol (PROVENTIL HFA;VENTOLIN HFA) 108 (90 BASE) MCG/ACT inhaler 2 puff (2 puffs Inhalation Given 01/12/13 0953)  diphenhydrAMINE (BENADRYL) capsule 50 mg (50 mg  Oral Given 01/12/13 0935)  famotidine (PEPCID) tablet 20 mg (20 mg Oral Given 01/12/13 0935)  aerochamber Z-Stat Plus/medium 1 each (1 each Other Given 01/12/13 0953)    Patient Vitals for the past 24 hrs:  BP Temp Temp src Pulse Resp SpO2  01/12/13 0926 122/83 mmHg 98.3 F (36.8 C) Oral 73 20 100 %    10:39 AM Reevaluation with update and discussion. After initial assessment and treatment, an updated evaluation reveals no further complaints. Tracer Gutridge L    Labs Review Labs Reviewed - No  data to display Imaging Review No results found.  EKG Interpretation   None       MDM   1. Allergic to insect bites, initial encounter   2. Tobacco abuse   3. Bronchospasm    Local allergic reaction related to insect bites. No evidence for systemic reaction or anaphylaxis. Incidental bronchospasm with tobacco abuse. She is stable for discharge with symptomatic treatment.  Nursing Notes Reviewed/ Care Coordinated, and agree without changes. Applicable Imaging Reviewed.  Interpretation of Laboratory Data incorporated into ED treatment   Plan: Home Medications- OTC histamine blockers, albuterol; Home Treatments and Observation- stop smoking; return here if the recommended treatment, does not improve the symptoms; Recommended follow up- PCP of choice as needed     Flint Melter, MD 01/12/13 1041

## 2013-01-12 NOTE — ED Notes (Signed)
Pt left before receiving discharge papers. Pt education done by EDP prior to departure.

## 2013-12-02 ENCOUNTER — Emergency Department (HOSPITAL_COMMUNITY): Payer: Self-pay

## 2013-12-02 ENCOUNTER — Emergency Department (HOSPITAL_COMMUNITY)
Admission: EM | Admit: 2013-12-02 | Discharge: 2013-12-02 | Disposition: A | Discharge status: Home / Self Care | Payer: Self-pay | Attending: Emergency Medicine | Admitting: Emergency Medicine

## 2013-12-02 ENCOUNTER — Encounter (HOSPITAL_COMMUNITY): Payer: Self-pay | Admitting: Emergency Medicine

## 2013-12-02 DIAGNOSIS — J45901 Unspecified asthma with (acute) exacerbation: Secondary | ICD-10-CM | POA: Insufficient documentation

## 2013-12-02 DIAGNOSIS — Z720 Tobacco use: Secondary | ICD-10-CM | POA: Insufficient documentation

## 2013-12-02 DIAGNOSIS — Z79899 Other long term (current) drug therapy: Secondary | ICD-10-CM | POA: Insufficient documentation

## 2013-12-02 DIAGNOSIS — B349 Viral infection, unspecified: Secondary | ICD-10-CM | POA: Insufficient documentation

## 2013-12-02 MED ORDER — ONDANSETRON HCL 4 MG PO TABS: 4.0000 mg | ORAL_TABLET | Freq: Four times a day (QID) | ORAL | Status: DC

## 2013-12-02 MED ORDER — ONDANSETRON 4 MG PO TBDP
4.0000 mg | ORAL_TABLET | Freq: Once | ORAL | Status: AC
  Administered 2013-12-02: 4 mg via ORAL
  Filled 2013-12-02: qty 1

## 2013-12-02 MED ORDER — ALBUTEROL SULFATE HFA 108 (90 BASE) MCG/ACT IN AERS
2.0000 | INHALATION_SPRAY | RESPIRATORY_TRACT | Status: DC | PRN
  Administered 2013-12-02: 2 via RESPIRATORY_TRACT
  Filled 2013-12-02: qty 6.7

## 2013-12-02 NOTE — ED Provider Notes (Signed)
CSN: [id number]     Arrival date & time 12/02/13  [id number] History   First MD Initiated Contact with Patient 12/02/13 [phone]     Chief Complaint  Patient presents with  . URI     (Consider location/radiation/quality/duration/timing/severity/associated sxs/prior Treatment) HPI Comments: Woke up this morning with 1 episode of emesis and 2 episodes of diarrhea.  Denies abdominal pain  Patient is a 33 y.o. female presenting with URI. The history is provided by the patient.  URI Presenting symptoms: congestion, cough and rhinorrhea   Severity:  Moderate Onset quality:  Gradual Duration:  3 days Timing:  Constant Progression:  Worsening Chronicity:  New Relieved by:  Nothing Worsened by:  Nothing tried Ineffective treatments:  OTC medications Associated symptoms: sneezing and wheezing   Risk factors comment:  Hx of asthma out of inhaler   Past Medical History  Diagnosis Date  . Asthma    Past Surgical History  Procedure Laterality Date  . Tubes tied     Family History  Problem Relation Age of Onset  . Diabetes Mother   . Diabetes Father    History  Substance Use Topics  . Smoking status: Current Every Day Smoker -- 0.50 packs/day    Types: Cigarettes  . Smokeless tobacco: Not on file  . Alcohol Use: Yes   OB History   Grav Para Term Preterm Abortions TAB SAB Ect Mult Living                 Review of Systems  HENT: Positive for congestion, rhinorrhea and sneezing.   Respiratory: Positive for cough and wheezing.   Gastrointestinal: Positive for nausea, vomiting and diarrhea. Negative for abdominal pain.  Genitourinary: Negative for dysuria, flank pain, vaginal bleeding and vaginal discharge.  All other systems reviewed and are negative.     Allergies  Review of patient's allergies indicates no known allergies.  Home Medications   Prior to Admission medications   Medication Sig Start Date End Date Taking? Authorizing Provider  albuterol (PROVENTIL HFA;VENTOLIN  HFA) 108 (90 BASE) MCG/ACT inhaler Inhale 2 puffs into the lungs every 6 (six) hours as needed for wheezing.    Historical Provider, MD   BP 126/72  Pulse 94  Temp(Src) 98.7 F (37.1 C) (Oral)  Resp 18  SpO2 100% Physical Exam  Nursing note and vitals reviewed. Constitutional: She is oriented to person, place, and time. She appears well-developed and well-nourished. No distress.  HENT:  Head: Normocephalic and atraumatic.  Right Ear: Tympanic membrane and ear canal normal.  Left Ear: Tympanic membrane and ear canal normal.  Nose: Mucosal edema and rhinorrhea present.  Mouth/Throat: Oropharynx is clear and moist.  Eyes: Conjunctivae and EOM are normal. Pupils are equal, round, and reactive to light.  Neck: Normal range of motion. Neck supple.  Cardiovascular: Normal rate, regular rhythm and intact distal pulses.   No murmur heard. Pulmonary/Chest: Effort normal and breath sounds normal. No respiratory distress. She has no wheezes. She has no rales.  Abdominal: Soft. Bowel sounds are normal. She exhibits no distension. There is tenderness. There is no rebound and no guarding.  Mild discomfort with palpation of the LLQ  Musculoskeletal: Normal range of motion. She exhibits no edema and no tenderness.  Neurological: She is alert and oriented to person, place, and time.  Skin: Skin is warm and dry. No rash noted. No erythema.  Psychiatric: She has a normal mood and affect. Her behavior is normal.    ED Course  Procedures (including  critical care time) Labs Review Labs Reviewed - No data to display  Imaging Review Dg Chest 2 View  12/02/2013   CLINICAL DATA:  Shortness of breath with cough and chest pain 3 days.  EXAM: CHEST  2 VIEW  COMPARISON:  08/19/2012  FINDINGS: The heart size and mediastinal contours are within normal limits. Both lungs are clear. The visualized skeletal structures are unremarkable.  IMPRESSION: No active cardiopulmonary disease.   Electronically Signed   By:  Elberta Fortisaniel  Boyle M.D.   On: 12/02/2013 09:53     EKG Interpretation None      MDM   Final diagnoses:  Viral syndrome    Pt with symptoms consistent with viral URI and had one episode of vomiting and diarrhea today.  Well appearing here.  No signs of breathing difficulty but does have hx of asthma. No signs of pharyngitis, otitis or abnormal abdominal findings.  Pt denies urinary sx and prior tubal ligation. CXR wnl and pt to return with any further problems.  9:45 AM Plain film wnl.  Will d/c pt home with new inhaler and f/u as she now has insurance.  Gwyneth SproutWhitney Mikias Lanz, MD 12/02/13 1001

## 2013-12-02 NOTE — ED Notes (Signed)
Per pt, states cold symptoms since Thurs

## 2013-12-02 NOTE — ED Notes (Signed)
Dr. Plunkett at bedside.  

## 2014-01-14 ENCOUNTER — Emergency Department (HOSPITAL_COMMUNITY)
Admission: EM | Admit: 2014-01-14 | Discharge: 2014-01-14 | Disposition: A | Discharge status: Home / Self Care | Payer: Self-pay | Attending: Emergency Medicine | Admitting: Emergency Medicine

## 2014-01-14 ENCOUNTER — Encounter (HOSPITAL_COMMUNITY): Payer: Self-pay | Admitting: *Deleted

## 2014-01-14 ENCOUNTER — Emergency Department (HOSPITAL_COMMUNITY): Payer: Self-pay

## 2014-01-14 DIAGNOSIS — M65311 Trigger thumb, right thumb: Secondary | ICD-10-CM | POA: Insufficient documentation

## 2014-01-14 DIAGNOSIS — Z720 Tobacco use: Secondary | ICD-10-CM | POA: Insufficient documentation

## 2014-01-14 DIAGNOSIS — R52 Pain, unspecified: Secondary | ICD-10-CM

## 2014-01-14 DIAGNOSIS — J45909 Unspecified asthma, uncomplicated: Secondary | ICD-10-CM | POA: Insufficient documentation

## 2014-01-14 MED ORDER — IBUPROFEN 800 MG PO TABS: 800.0000 mg | ORAL_TABLET | Freq: Three times a day (TID) | ORAL | Status: DC

## 2014-01-14 MED ORDER — HYDROCODONE-ACETAMINOPHEN 5-325 MG PO TABS: 2.0000 | ORAL_TABLET | ORAL | Status: DC | PRN

## 2014-01-14 MED ORDER — HYDROCODONE-ACETAMINOPHEN 5-325 MG PO TABS
2.0000 | ORAL_TABLET | Freq: Once | ORAL | Status: AC
  Administered 2014-01-14: 2 via ORAL
  Filled 2014-01-14: qty 2

## 2014-01-14 NOTE — ED Notes (Signed)
Pt reports right hand swelling, pain and weakness x 1 month.

## 2014-01-14 NOTE — ED Provider Notes (Signed)
CSN: [id number]     Arrival date & time 01/14/14  1053 History   First MD Initiated Contact with Patient 01/14/14 1132     Chief Complaint  Patient presents with  . Hand Pain     (Consider location/radiation/quality/duration/timing/severity/associated sxs/prior Treatment) Patient is a 33 y.o. female presenting with hand pain. The history is provided by the patient. No language interpreter was used.  Hand Pain This is a new problem. The current episode started more than 1 month ago. The problem occurs constantly. The problem has been unchanged. Associated symptoms include myalgias. Nothing aggravates the symptoms. She has tried nothing for the symptoms. The treatment provided moderate relief.  Pt complains of pain in her hand.    Past Medical History  Diagnosis Date  . Asthma    Past Surgical History  Procedure Laterality Date  . Tubes tied     Family History  Problem Relation Age of Onset  . Diabetes Mother   . Diabetes Father    History  Substance Use Topics  . Smoking status: Current Every Day Smoker -- 0.50 packs/day    Types: Cigarettes  . Smokeless tobacco: Not on file  . Alcohol Use: Yes   OB History    No data available     Review of Systems  Musculoskeletal: Positive for myalgias.  All other systems reviewed and are negative.     Allergies  Review of patient's allergies indicates no known allergies.  Home Medications   Prior to Admission medications   Medication Sig Start Date End Date Taking? Authorizing Provider  ondansetron (ZOFRAN) 4 MG tablet Take 1 tablet (4 mg total) by mouth every 6 (six) hours. 12/02/13   Gwyneth SproutWhitney Plunkett, MD  Phenylephrine-Pheniramine-DM [facility](THERAFLU COLD & COUGH) 12-06-18 MG PACK Take 1 packet by mouth as needed (for cold symptoms).    Historical Provider, MD   BP 126/82 mmHg  Pulse 73  Temp(Src) 98.9 F (37.2 C) (Oral)  Resp 20  SpO2 100%  LMP 12/19/2013 (Approximate) Physical Exam  Constitutional: She is oriented to  person, place, and time. She appears well-developed and well-nourished.  HENT:  Head: Normocephalic and atraumatic.  Eyes: Conjunctivae are normal. Pupils are equal, round, and reactive to light.  Neck: Normal range of motion.  Musculoskeletal: She exhibits tenderness.  Tender thumb and hand.  Pain with range of motion,  nv and ns intact   Neurological: She is alert and oriented to person, place, and time. She has normal reflexes.  Skin: Skin is warm.  Psychiatric: She has a normal mood and affect.  Nursing note and vitals reviewed.   ED Course  Procedures (including critical care time) Labs Review Labs Reviewed - No data to display  Imaging Review Dg Hand Complete Right  01/14/2014   CLINICAL DATA:  Thumb pain.  No injury  EXAM: RIGHT HAND - COMPLETE 3+ VIEW  COMPARISON:  None.  FINDINGS: There is no evidence of fracture or dislocation. There is no evidence of arthropathy or other focal bone abnormality. Soft tissues are unremarkable.  IMPRESSION: Negative.   Electronically Signed   By: Marlan Palauharles  Clark M.D.   On: 01/14/2014 12:11     EKG Interpretation None      MDM   Final diagnoses:  Pain  Trigger thumb, right   Thumb spica splint   Follow up with Dr. Merlyn LotKuzma for evaluation Ibuprofen Hydrocodone avs  Amy AreasLeslie K Sofia, PA-C 01/14/14 1230  Nelia Shiobert L Beaton, MD 01/15/14 [phone]

## 2016-11-10 ENCOUNTER — Encounter (HOSPITAL_COMMUNITY): Payer: Self-pay | Admitting: Emergency Medicine

## 2016-11-10 ENCOUNTER — Emergency Department (HOSPITAL_COMMUNITY)
Admission: EM | Admit: 2016-11-10 | Discharge: 2016-11-10 | Disposition: A | Discharge status: Home / Self Care | Payer: Self-pay | Attending: Emergency Medicine | Admitting: Emergency Medicine

## 2016-11-10 DIAGNOSIS — Y999 Unspecified external cause status: Secondary | ICD-10-CM | POA: Insufficient documentation

## 2016-11-10 DIAGNOSIS — Y929 Unspecified place or not applicable: Secondary | ICD-10-CM | POA: Insufficient documentation

## 2016-11-10 DIAGNOSIS — S0502XA Injury of conjunctiva and corneal abrasion without foreign body, left eye, initial encounter: Secondary | ICD-10-CM | POA: Insufficient documentation

## 2016-11-10 DIAGNOSIS — X58XXXA Exposure to other specified factors, initial encounter: Secondary | ICD-10-CM | POA: Insufficient documentation

## 2016-11-10 DIAGNOSIS — F17210 Nicotine dependence, cigarettes, uncomplicated: Secondary | ICD-10-CM | POA: Insufficient documentation

## 2016-11-10 DIAGNOSIS — Y939 Activity, unspecified: Secondary | ICD-10-CM | POA: Insufficient documentation

## 2016-11-10 DIAGNOSIS — H53149 Visual discomfort, unspecified: Secondary | ICD-10-CM | POA: Insufficient documentation

## 2016-11-10 MED ORDER — IBUPROFEN 200 MG PO TABS
600.0000 mg | ORAL_TABLET | Freq: Once | ORAL | Status: AC
  Administered 2016-11-10: 600 mg via ORAL
  Filled 2016-11-10: qty 3

## 2016-11-10 MED ORDER — FLUORESCEIN SODIUM 0.6 MG OP STRP
1.0000 | ORAL_STRIP | Freq: Once | OPHTHALMIC | Status: AC
  Administered 2016-11-10: 1 via OPHTHALMIC
  Filled 2016-11-10: qty 1

## 2016-11-10 MED ORDER — ERYTHROMYCIN 5 MG/GM OP OINT: TOPICAL_OINTMENT | OPHTHALMIC | 0 refills | Status: DC

## 2016-11-10 NOTE — ED Triage Notes (Signed)
Patient c/o left eye pain with watery drainage and sensitivity to light since patient reports putting in new contact this morning. Pt has since taken contact out and has on sunglasses and left eye covered with dressing

## 2016-11-10 NOTE — ED Provider Notes (Signed)
[facility]-EMERGENCY DEPT Provider Note   CSN: [id number] Arrival date & time: 11/10/16  1830     History   Chief Complaint Chief Complaint  Patient presents with  . Eye Pain    HPI Amy Guerrero is a 36 y.o. female.  The history is provided by the patient and medical records. No language interpreter was used.  Eye Pain  Pertinent negatives include no headaches.   Amy Guerrero is a 36 y.o. female  with a PMH of asthma who presents to the Emergency Department complaining of persistent left eye pain since this morning. Patient states that she put her contact in this morning and felt some discomfort. She quickly took the contact out but the irritation did not improve. She feels as if something got in her eye when putting contact in. Flushed eye with water with no relief. No visual changes. Associated with clear tearing discharge just of the affected eye. No fever, chills, cough, congestion. Pain worse with the light. Better when eye is closed. No history of similar sxs.   Past Medical History:  Diagnosis Date  . Asthma     There are no active problems to display for this patient.   Past Surgical History:  Procedure Laterality Date  . tubes tied      OB History    No data available       Home Medications    Prior to Admission medications   Medication Sig Start Date End Date Taking? Authorizing Provider  erythromycin ophthalmic ointment Place a 1/2 inch ribbon of ointment into the lower eyelid 4-6 times per day. 11/10/16   Ward, Chase Picket, PA-C  HYDROcodone-acetaminophen (NORCO/VICODIN) 5-325 MG per tablet Take 2 tablets by mouth every 4 (four) hours as needed. 01/14/14   Elson Areas, PA-C  ibuprofen (ADVIL,MOTRIN) 800 MG tablet Take 1 tablet (800 mg total) by mouth 3 (three) times daily. 01/14/14   Elson Areas, PA-C  ondansetron (ZOFRAN) 4 MG tablet Take 1 tablet (4 mg total) by mouth every 6 (six) hours. 12/02/13   Gwyneth Sprout, MD    Phenylephrine-Pheniramine-DM [facility] COLD & COUGH) 12-06-18 MG PACK Take 1 packet by mouth as needed (for cold symptoms).    [provider]    Family History Family History  Problem Relation Age of Onset  . Diabetes Mother   . Diabetes Father     Social History Social History  Substance Use Topics  . Smoking status: Current Every Day Smoker    Packs/day: 0.50    Types: Cigarettes  . Smokeless tobacco: Never Used  . Alcohol use Yes     Allergies   Patient has no known allergies.   Review of Systems Review of Systems  Constitutional: Negative for chills and fever.  HENT: Negative for congestion and sore throat.   Eyes: Positive for photophobia, pain, discharge and redness. Negative for visual disturbance.  Respiratory: Negative for cough.   Neurological: Negative for headaches.     Physical Exam Updated Vital Signs BP (!) 146/94 (BP Location: Left Arm)   Pulse 65   Temp 99.5 F (37.5 C) (Oral)   Resp 18   Ht  (1.549 m)   Wt 76.2 kg (168 lb)   LMP 10/06/2016   SpO2 100%   BMI 31.74 kg/m   Physical Exam  Constitutional: She appears well-developed and well-nourished. No distress.  HENT:  Head: Normocephalic and atraumatic.  Eyes: Pupils are equal, round, and reactive to light. EOM are normal.  Lids are everted and swept, no foreign bodies found. Right conjunctiva is not injected. Left conjunctiva is injected.  Left eye with fluorescein uptake consistent with corneal abrasion.   Neck: Neck supple.  Cardiovascular: Normal rate, regular rhythm and normal heart sounds.   No murmur heard. Pulmonary/Chest: Effort normal and breath sounds normal. No respiratory distress. She has no wheezes. She has no rales.  Musculoskeletal: Normal range of motion.  Neurological: She is alert.  Skin: Skin is warm and dry.  Nursing note and vitals reviewed.    ED Treatments / Results  Labs (all labs ordered are listed, but only abnormal results are  displayed) Labs Reviewed - No data to display  EKG  EKG Interpretation None       Radiology No results found.  Procedures Procedures (including critical care time)  Medications Ordered in ED Medications  fluorescein ophthalmic strip 1 strip (1 strip Left Eye Given by Other 11/10/16 2205)  ibuprofen (ADVIL,MOTRIN) tablet 600 mg (600 mg Oral Given 11/10/16 2204)     Initial Impression / Assessment and Plan / ED Course  I have reviewed the triage vital signs and the nursing notes.  Pertinent labs & imaging results that were available during my care of the patient were reviewed by me and considered in my medical decision making (see chart for details).    Amy Guerrero is a 37 y.o. female who presents to ED for left eye pain that began this morning shortly after putting contact lens in her eye.   Fluorescein uptake c/w corneal abrasion on exam. Eye thoroughly flushed, lid everted. No evidence of foreign body. Exam not concerning for orbital cellulitis or hyphema. No concern for uveitis. Patient will be discharged home with erythromycin ointment.  Patient understands to follow up with ophthalmology. Return precautions discussed. Patient appears safe for discharge and all questions were answered.   Final Clinical Impressions(s) / ED Diagnoses   Final diagnoses:  Abrasion of left cornea, initial encounter    New Prescriptions New Prescriptions   ERYTHROMYCIN OPHTHALMIC OINTMENT    Place a 1/2 inch ribbon of ointment into the lower eyelid 4-6 times per day.     Ward, Chase Picket, PA-C 11/10/16 2221    Charlynne Pander, MD 11/10/16 [phone]

## 2016-11-10 NOTE — ED Notes (Signed)
Discharge instructions reviewed with patient. Patient verbalizes understanding. VSS.   

## 2016-11-10 NOTE — Discharge Instructions (Signed)
It was my pleasure taking care of you today!   Apply antibiotic ointment to the affected eye as directed.   Call the eye doctor listed to schedule a follow up appointment.   Return to ER for new or worsening symptoms, any additional concerns.

## 2017-06-16 ENCOUNTER — Emergency Department (HOSPITAL_COMMUNITY)
Admission: EM | Admit: 2017-06-16 | Discharge: 2017-06-16 | Disposition: A | Discharge status: Home / Self Care | Payer: Self-pay | Attending: Emergency Medicine | Admitting: Emergency Medicine

## 2017-06-16 DIAGNOSIS — R112 Nausea with vomiting, unspecified: Secondary | ICD-10-CM | POA: Insufficient documentation

## 2017-06-16 DIAGNOSIS — J45909 Unspecified asthma, uncomplicated: Secondary | ICD-10-CM | POA: Insufficient documentation

## 2017-06-16 DIAGNOSIS — R531 Weakness: Secondary | ICD-10-CM | POA: Insufficient documentation

## 2017-06-16 DIAGNOSIS — R202 Paresthesia of skin: Secondary | ICD-10-CM | POA: Insufficient documentation

## 2017-06-16 DIAGNOSIS — F17210 Nicotine dependence, cigarettes, uncomplicated: Secondary | ICD-10-CM | POA: Insufficient documentation

## 2017-06-16 LAB — CBC WITH DIFFERENTIAL/PLATELET
BASOS PCT: 0 %
Basophils Absolute: 0 10*3/uL (ref 0.0–0.1)
EOS ABS: 0 10*3/uL (ref 0.0–0.7)
Eosinophils Relative: 0 %
HCT: 30.4 % — ABNORMAL LOW (ref 36.0–46.0)
Hemoglobin: 9.5 g/dL — ABNORMAL LOW (ref 12.0–15.0)
Lymphocytes Relative: 49 %
Lymphs Abs: 1.4 10*3/uL (ref 0.7–4.0)
MCH: 20 pg — AB (ref 26.0–34.0)
MCHC: 31.3 g/dL (ref 30.0–36.0)
MCV: 63.9 fL — ABNORMAL LOW (ref 78.0–100.0)
MONO ABS: 0.3 10*3/uL (ref 0.1–1.0)
Monocytes Relative: 10 %
NEUTROS ABS: 1.1 10*3/uL — AB (ref 1.7–7.7)
Neutrophils Relative %: 41 %
PLATELETS: 205 10*3/uL (ref 150–400)
RBC: 4.76 MIL/uL (ref 3.87–5.11)
RDW: 17.6 % — ABNORMAL HIGH (ref 11.5–15.5)
WBC: 2.8 10*3/uL — ABNORMAL LOW (ref 4.0–10.5)

## 2017-06-16 LAB — I-STAT BETA HCG BLOOD, ED (MC, WL, AP ONLY): I-stat hCG, quantitative: <5 m[IU]/mL (ref <5)

## 2017-06-16 LAB — COMPREHENSIVE METABOLIC PANEL
ALT: 16 U/L (ref 14–54)
AST: 22 U/L (ref 15–41)
Albumin: 3.8 g/dL (ref 3.5–5.0)
Alkaline Phosphatase: 70 U/L (ref 38–126)
Anion gap: 8 (ref 5–15)
BUN: 9 mg/dL (ref 6–20)
CALCIUM: 8.9 mg/dL (ref 8.9–10.3)
CHLORIDE: 106 mmol/L (ref 101–111)
CO2: 25 mmol/L (ref 22–32)
Creatinine, Ser: 0.9 mg/dL (ref 0.44–1.00)
Glucose, Bld: 102 mg/dL — ABNORMAL HIGH (ref 65–99)
Potassium: 3.4 mmol/L — ABNORMAL LOW (ref 3.5–5.1)
SODIUM: 139 mmol/L (ref 135–145)
Total Bilirubin: 0.6 mg/dL (ref 0.3–1.2)
Total Protein: 8 g/dL (ref 6.5–8.1)

## 2017-06-16 LAB — MAGNESIUM: MAGNESIUM: 2.2 mg/dL (ref 1.7–2.4)

## 2017-06-16 MED ORDER — SODIUM CHLORIDE 0.9 % IV BOLUS
500.0000 mL | Freq: Once | INTRAVENOUS | Status: AC
  Administered 2017-06-16: 500 mL via INTRAVENOUS

## 2017-06-16 MED ORDER — POTASSIUM CHLORIDE CRYS ER 20 MEQ PO TBCR
20.0000 meq | EXTENDED_RELEASE_TABLET | Freq: Once | ORAL | Status: AC
  Administered 2017-06-16: 20 meq via ORAL
  Filled 2017-06-16: qty 1

## 2017-06-16 NOTE — ED Provider Notes (Signed)
[facility]-EMERGENCY DEPT Provider Note   CSN: [id number] Arrival date & time: 06/16/17  1539     History   Chief Complaint Chief Complaint  Patient presents with  . Emesis  . Weakness  . Hand Problem    HPI Amy Guerrero is a 37 y.o. female.  The history is provided by the patient and medical records. No language interpreter was used.  Emesis   Pertinent negatives include no abdominal pain, no diarrhea and no fever.  Weakness  Associated symptoms include vomiting.     Amy Guerrero is a 37 y.o. female  with a PMH of asthma who presents to the Emergency Department with two complaints:  1.  Patient states that she missed work today because she had one episode of emesis.  She reports just feeling weak and like she could not do her usual loading activities at work.  She initially was nauseous which is now resolved.  She has had no further emesis and did eat something before she got here.  She denies any diarrhea, fever, chills.  No abdominal pain today.  No chest pain, shortness of breath.  No medications taken prior to arrival for symptoms.  Denies any recent travel or known sick contacts.  Prior tubal ligation.  2.  Intermittent right hand tingling over the last 5 months at night. Feels as if she is waking up more often for this. She is right-hand dominant and does a lot of lifting at work.  She believes this may be contributory.  She denies any trouble moving the hand.  She has not actually dropped any objects or having any difficulty with grip strength.  No neck pain or extremity pain, just tingling sensation as if her hand is going to sleep.  It is not one specific part of the hand, but the entire hand itself.  No medications taken prior to arrival for either complaints.   Past Medical History:  Diagnosis Date  . Asthma     There are no active problems to display for this patient.   Past Surgical History:  Procedure Laterality Date  . tubes  tied       OB History   None      Home Medications    Prior to Admission medications   Medication Sig Start Date End Date Taking? Authorizing Provider  aspirin EC 325 MG tablet Take 325 mg by mouth daily as needed for moderate pain.   Yes [provider]  erythromycin ophthalmic ointment Place a 1/2 inch ribbon of ointment into the lower eyelid 4-6 times per day. Patient not taking: Reported on 06/16/2017 11/10/16   Ruba Outen, Chase Picket, PA-C  HYDROcodone-acetaminophen (NORCO/VICODIN) 5-325 MG per tablet Take 2 tablets by mouth every 4 (four) hours as needed. Patient not taking: Reported on 06/16/2017 01/14/14   Elson Areas, PA-C  ibuprofen (ADVIL,MOTRIN) 800 MG tablet Take 1 tablet (800 mg total) by mouth 3 (three) times daily. Patient not taking: Reported on 06/16/2017 01/14/14   Elson Areas, PA-C  ondansetron (ZOFRAN) 4 MG tablet Take 1 tablet (4 mg total) by mouth every 6 (six) hours. Patient not taking: Reported on 06/16/2017 12/02/13   Gwyneth Sprout, MD  Phenylephrine-Pheniramine-DM [facility] COLD & COUGH) 12-06-18 MG PACK Take 1 packet by mouth as needed (for cold symptoms).    [provider]    Family History Family History  Problem Relation Age of Onset  . Diabetes Mother   . Diabetes Father  Social History Social History   Tobacco Use  . Smoking status: Current Every Day Smoker    Packs/day: 0.50    Types: Cigarettes  . Smokeless tobacco: Never Used  Substance Use Topics  . Alcohol use: Yes  . Drug use: Yes    Types: Marijuana     Allergies   Patient has no known allergies.   Review of Systems Review of Systems  Constitutional: Negative for fever.  Gastrointestinal: Positive for nausea (Resolved) and vomiting. Negative for abdominal pain, blood in stool, constipation and diarrhea.  Neurological: Positive for numbness (Tingling right hand).  All other systems reviewed and are negative.    Physical Exam Updated Vital  Signs BP 111/72   Pulse (!) 56   Temp 98.5 F (36.9 C) (Oral)   Resp 18   LMP 06/08/2017   SpO2 100%   Physical Exam  Constitutional: She is oriented to person, place, and time. She appears well-developed and well-nourished. No distress.  Non-toxic appearing.  HENT:  Head: Normocephalic and atraumatic.  Cardiovascular: Normal rate, regular rhythm and normal heart sounds.  No murmur heard. Pulmonary/Chest: Effort normal and breath sounds normal. No respiratory distress.  Abdominal: Soft. Bowel sounds are normal. She exhibits no distension.  No abdominal or CVA tenderness.  Musculoskeletal:  Right hand with full range of motion.  Good grip strength and pincer grasp.  Negative Tinel's. Good cap refill.  2+ radial pulse.  Sensation intact to radial, ulnar and median nerve distributions.  No cervical tenderness.  Full range of motion of the neck without pain.  Neurological: She is alert and oriented to person, place, and time.  Skin: Skin is warm and dry.  Nursing note and vitals reviewed.    ED Treatments / Results  Labs (all labs ordered are listed, but only abnormal results are displayed) Labs Reviewed  CBC WITH DIFFERENTIAL/PLATELET - Abnormal; Notable for the following components:      Result Value   WBC 2.8 (*)    Hemoglobin 9.5 (*)    HCT 30.4 (*)    MCV 63.9 (*)    MCH 20.0 (*)    RDW 17.6 (*)    Neutro Abs 1.1 (*)    All other components within normal limits  COMPREHENSIVE METABOLIC PANEL - Abnormal; Notable for the following components:   Potassium 3.4 (*)    Glucose, Bld 102 (*)    All other components within normal limits  MAGNESIUM  I-STAT BETA HCG BLOOD, ED ([facility], [facility], [facility] ONLY)    EKG None  Radiology No results found.  Procedures Procedures (including critical care time)  Medications Ordered in ED Medications  sodium chloride 0.9 % bolus 500 mL (0 mLs Intravenous Stopped 06/16/17 2009)  potassium chloride SA (K-DUR,KLOR-CON) CR tablet 20 mEq (20 mEq  Oral Given 06/16/17 2010)     Initial Impression / Assessment and Plan / ED Course  I have reviewed the triage vital signs and the nursing notes.  Pertinent labs & imaging results that were available during my care of the patient were reviewed by me and considered in my medical decision making (see chart for details).    Amy Guerrero is a 37 y.o. female who presents to ED for 2 complaints:  1.  One episode of vomiting earlier today.  She has had something to eat and drink prior to emergency department arrival with no further episode of emesis.  She currently has no abdominal pain, nausea or vomiting.  She has a benign abdominal  exam.  She is afebrile with reassuring vital signs.  No CVA tenderness.  She is tolerating p.o. in the ER without any difficulty.  Pregnancy test negative.  Labs reassuring.  Does have slight drop in her hemoglobin at 9.5.  She denies any blood in stool.  She did recently finish her menstrual cycle a few days ago.  We will have her follow-up with PCP for this.  On repeat exam, abdominal exam with no peritoneal signs. No indication of appendicitis, bowel obstruction, bowel perforation, cholecystitis, diverticulitis or ectopic pregnancy.    2.  Right hand tingling at night over the last several months.  Hand exam benign. Neurovascularly intact. No cervical tenderness to suggest cervical radiculopathy. Given wrist splint in ED.   PCP follow up encouraged. Return precautions discussed. All questions answered.     Final Clinical Impressions(s) / ED Diagnoses   Final diagnoses:  Weakness  Non-intractable vomiting with nausea, unspecified vomiting type  Right hand paresthesia    ED Discharge Orders    None       Verlie Liotta, Chase Picket, PA-C 06/16/17 2035    Marily Memos, MD 06/20/17 1529

## 2017-06-16 NOTE — ED Triage Notes (Signed)
Pt complains weakness and vomiting. Pt also complains of right hand tingling and numbness for the past 5 months. Pt denies diarrhea or abd pain.

## 2017-06-16 NOTE — Discharge Instructions (Signed)
It was my pleasure taking care of you today!   Follow up with the primary care clinic listed.  Call in the morning to schedule a follow-up appointment.  If you would like to see a specialist for your right hand pain/tingling, have included the information for the orthopedic clinic for you.   Return to the ER for new or worsening symptoms, any additional concerns.

## 2018-04-17 ENCOUNTER — Encounter (HOSPITAL_COMMUNITY): Payer: Self-pay | Admitting: Emergency Medicine

## 2018-04-17 ENCOUNTER — Other Ambulatory Visit: Payer: Self-pay

## 2018-04-17 ENCOUNTER — Emergency Department (HOSPITAL_COMMUNITY)
Admission: EM | Admit: 2018-04-17 | Discharge: 2018-04-17 | Disposition: A | Discharge status: Home / Self Care | Payer: BLUE CROSS/BLUE SHIELD | Attending: Emergency Medicine | Admitting: Emergency Medicine

## 2018-04-17 DIAGNOSIS — F17210 Nicotine dependence, cigarettes, uncomplicated: Secondary | ICD-10-CM | POA: Insufficient documentation

## 2018-04-17 DIAGNOSIS — J45909 Unspecified asthma, uncomplicated: Secondary | ICD-10-CM | POA: Insufficient documentation

## 2018-04-17 DIAGNOSIS — R69 Illness, unspecified: Secondary | ICD-10-CM

## 2018-04-17 DIAGNOSIS — J111 Influenza due to unidentified influenza virus with other respiratory manifestations: Secondary | ICD-10-CM | POA: Insufficient documentation

## 2018-04-17 DIAGNOSIS — R5383 Other fatigue: Secondary | ICD-10-CM | POA: Diagnosis present

## 2018-04-17 LAB — CBC WITH DIFFERENTIAL/PLATELET
Abs Immature Granulocytes: 0.01 10*3/uL (ref 0.00–0.07)
BASOS ABS: 0 10*3/uL (ref 0.0–0.1)
Basophils Relative: 1 %
EOS ABS: 0.1 10*3/uL (ref 0.0–0.5)
Eosinophils Relative: 3 %
HEMATOCRIT: 34.8 % — AB (ref 36.0–46.0)
HEMOGLOBIN: 10.2 g/dL — AB (ref 12.0–15.0)
Immature Granulocytes: 0 %
Lymphocytes Relative: 36 %
Lymphs Abs: 1 10*3/uL (ref 0.7–4.0)
MCH: 19.8 pg — ABNORMAL LOW (ref 26.0–34.0)
MCHC: 29.3 g/dL — AB (ref 30.0–36.0)
MCV: 67.4 fL — ABNORMAL LOW (ref 80.0–100.0)
MONOS PCT: 10 %
Monocytes Absolute: 0.3 10*3/uL (ref 0.1–1.0)
NEUTROS ABS: 1.3 10*3/uL — AB (ref 1.7–7.7)
NEUTROS PCT: 50 %
Platelets: 236 10*3/uL (ref 150–400)
RBC: 5.16 MIL/uL — ABNORMAL HIGH (ref 3.87–5.11)
RDW: 20 % — ABNORMAL HIGH (ref 11.5–15.5)
WBC: 2.7 10*3/uL — ABNORMAL LOW (ref 4.0–10.5)
nRBC: 0 % (ref 0.0–0.2)

## 2018-04-17 LAB — BASIC METABOLIC PANEL
Anion gap: 7 (ref 5–15)
BUN: 14 mg/dL (ref 6–20)
CHLORIDE: 107 mmol/L (ref 98–111)
CO2: 24 mmol/L (ref 22–32)
Calcium: 8.8 mg/dL — ABNORMAL LOW (ref 8.9–10.3)
Creatinine, Ser: 0.86 mg/dL (ref 0.44–1.00)
GFR calc non Af Amer: >60 mL/min (ref >60)
Glucose, Bld: 88 mg/dL (ref 70–99)
POTASSIUM: 3.9 mmol/L (ref 3.5–5.1)
SODIUM: 138 mmol/L (ref 135–145)

## 2018-04-17 LAB — I-STAT BETA HCG BLOOD, ED (MC, WL, AP ONLY)

## 2018-04-17 MED ORDER — SODIUM CHLORIDE 0.9 % IV BOLUS: 500.0000 mL | Freq: Once | INTRAVENOUS | Status: DC

## 2018-04-17 MED ORDER — ACETAMINOPHEN 325 MG PO TABS
650.0000 mg | ORAL_TABLET | Freq: Once | ORAL | Status: AC
  Administered 2018-04-17: 650 mg via ORAL
  Filled 2018-04-17: qty 2

## 2018-04-17 NOTE — ED Provider Notes (Signed)
[facility]-EMERGENCY DEPT Provider Note   CSN: [id number] Arrival date & time: 04/17/18  1624    History   Chief Complaint Chief Complaint  Patient presents with  . Fatigue  . Generalized Body Aches  . Nasal Congestion    HPI Amy Guerrero is a 38 y.o. female presented today for 3-day history of rhinorrhea, congestion, generalized body aches and generalized fatigue.  Patient reports that her supervisor at work has been sick with similar symptoms and feels that she has caught something from her supervisor.  Patient denies nausea or vomiting however states that she has had decreased appetite over the past 3 days.  Patient denies focal area of pain describes generalized body aches to all of her joints and muscles constant mild worsened with movement and improved with rest.  Patient has taken Advil for her symptoms with moderate relief.  Patient states that she has been feeling fatigued since her illness began and she is overly concerned with her potassium level.  Patient states that last year when she developed this her potassium was low and she is adamant this rechecked today.  Chart review shows potassium of 3.4 10 months ago when she was seen for weakness and vomiting.     HPI  Past Medical History:  Diagnosis Date  . Asthma     There are no active problems to display for this patient.   Past Surgical History:  Procedure Laterality Date  . tubes tied       OB History   No obstetric history on file.      Home Medications    Prior to Admission medications   Medication Sig Start Date End Date Taking? Authorizing Provider  aspirin EC 325 MG tablet Take 325 mg by mouth daily as needed for moderate pain.    [provider]  erythromycin ophthalmic ointment Place a 1/2 inch ribbon of ointment into the lower eyelid 4-6 times per day. Patient not taking: Reported on 06/16/2017 11/10/16   Ward, Chase Picket, PA-C  HYDROcodone-acetaminophen  (NORCO/VICODIN) 5-325 MG per tablet Take 2 tablets by mouth every 4 (four) hours as needed. Patient not taking: Reported on 06/16/2017 01/14/14   Elson Areas, PA-C  ibuprofen (ADVIL,MOTRIN) 800 MG tablet Take 1 tablet (800 mg total) by mouth 3 (three) times daily. Patient not taking: Reported on 06/16/2017 01/14/14   Elson Areas, PA-C  ondansetron (ZOFRAN) 4 MG tablet Take 1 tablet (4 mg total) by mouth every 6 (six) hours. Patient not taking: Reported on 06/16/2017 12/02/13   Gwyneth Sprout, MD  Phenylephrine-Pheniramine-DM [facility] COLD & COUGH) 12-06-18 MG PACK Take 1 packet by mouth as needed (for cold symptoms).    [provider]    Family History Family History  Problem Relation Age of Onset  . Diabetes Mother   . Diabetes Father     Social History Social History   Tobacco Use  . Smoking status: Current Every Day Smoker    Packs/day: 0.50    Types: Cigarettes  . Smokeless tobacco: Never Used  Substance Use Topics  . Alcohol use: Yes  . Drug use: Yes    Types: Marijuana     Allergies   Patient has no known allergies.   Review of Systems Review of Systems  Constitutional: Positive for appetite change. Negative for chills and fever (Subjective).  HENT: Positive for congestion and rhinorrhea. Negative for drooling, sore throat, trouble swallowing and voice change.   Eyes: Negative.  Negative for  visual disturbance.  Respiratory: Negative.  Negative for cough and shortness of breath.   Cardiovascular: Negative.  Negative for chest pain.  Gastrointestinal: Positive for nausea. Negative for abdominal pain, diarrhea and vomiting.  Musculoskeletal: Positive for arthralgias and myalgias. Negative for neck pain and neck stiffness.  Skin: Negative.  Negative for rash.  Neurological: Negative.  Negative for dizziness, syncope, weakness, numbness and headaches.   Physical Exam Updated Vital Signs BP (!) 147/94 (BP Location: Right Arm)   Pulse 78   Temp 98.8  F (37.1 C) (Oral)   Resp 17   LMP 04/14/2018   SpO2 99%   Physical Exam Constitutional:      General: She is not in acute distress.    Appearance: Normal appearance. She is well-developed. She is not ill-appearing or diaphoretic.  HENT:     Head: Normocephalic and atraumatic.     Jaw: There is normal jaw occlusion. No trismus.     Right Ear: Tympanic membrane, ear canal and external ear normal.     Left Ear: Tympanic membrane, ear canal and external ear normal.     Nose: Rhinorrhea present. Rhinorrhea is clear.     Mouth/Throat:     Lips: Pink.     Mouth: Mucous membranes are moist.     Pharynx: Oropharynx is clear. Uvula midline.     Comments: The patient has normal phonation and is in control of secretions. No stridor.  Midline uvula without edema. Soft palate rises symmetrically. No tonsillar erythema, swelling or exudates. Tongue protrusion is normal, floor of mouth is soft. No trismus. No creptius on neck palpation. No gingival erythema or fluctuance noted. Mucus membranes moist. Eyes:     General: Vision grossly intact. Gaze aligned appropriately.     Extraocular Movements: Extraocular movements intact.     Conjunctiva/sclera: Conjunctivae normal.     Pupils: Pupils are equal, round, and reactive to light.  Neck:     Musculoskeletal: Full passive range of motion without pain, normal range of motion and neck supple. No neck rigidity or crepitus.     Trachea: Trachea and phonation normal. No tracheal tenderness or tracheal deviation.  Cardiovascular:     Rate and Rhythm: Normal rate and regular rhythm.     Pulses:          Dorsalis pedis pulses are 2+ on the right side and 2+ on the left side.       Posterior tibial pulses are 2+ on the right side and 2+ on the left side.     Heart sounds: Normal heart sounds.  Pulmonary:     Effort: Pulmonary effort is normal. No accessory muscle usage or respiratory distress.     Breath sounds: Normal breath sounds and air entry. No  decreased breath sounds, wheezing or rhonchi.  Chest:     Chest wall: No tenderness.  Abdominal:     General: Bowel sounds are normal. There is no distension.     Palpations: Abdomen is soft.     Tenderness: There is no abdominal tenderness. There is no right CVA tenderness, left CVA tenderness, guarding or rebound. Negative signs include Murphy's sign, Rovsing's sign and McBurney's sign.  Musculoskeletal: Normal range of motion.     Right lower leg: Normal. She exhibits no tenderness. No edema.     Left lower leg: Normal. She exhibits no tenderness. No edema.  Feet:     Right foot:     Protective Sensation: 3 sites tested. 3 sites sensed.  Left foot:     Protective Sensation: 3 sites tested. 3 sites sensed.  Skin:    General: Skin is warm and dry.  Neurological:     Mental Status: She is alert.     GCS: GCS eye subscore is 4. GCS verbal subscore is 5. GCS motor subscore is 6.     Comments: Speech is clear and goal oriented, follows commands Major Cranial nerves without deficit, no facial droop Normal strength in upper and lower extremities bilaterally including dorsiflexion and plantar flexion, strong and equal grip strength Sensation normal to light touch Moves extremities without ataxia, coordination intact Normal finger to nose and rapid alternating movements Neg romberg, no pronator drift Normal gait  Psychiatric:        Behavior: Behavior normal.    ED Treatments / Results  Labs (all labs ordered are listed, but only abnormal results are displayed) Labs Reviewed  CBC WITH DIFFERENTIAL/PLATELET - Abnormal; Notable for the following components:      Result Value   WBC 2.7 (*)    RBC 5.16 (*)    Hemoglobin 10.2 (*)    HCT 34.8 (*)    MCV 67.4 (*)    MCH 19.8 (*)    MCHC 29.3 (*)    RDW 20.0 (*)    Neutro Abs 1.3 (*)    All other components within normal limits  BASIC METABOLIC PANEL - Abnormal; Notable for the following components:   Calcium 8.8 (*)    All  other components within normal limits  I-STAT BETA HCG BLOOD, ED ([facility], [facility], [facility] ONLY)    EKG None  Radiology No results found.  Procedures Procedures (including critical care time)  Medications Ordered in ED Medications  acetaminophen (TYLENOL) tablet 650 mg (650 mg Oral Given 04/17/18 1931)     Initial Impression / Assessment and Plan / ED Course  I have reviewed the triage vital signs and the nursing notes.  Pertinent labs & imaging results that were available during my care of the patient were reviewed by me and considered in my medical decision making (see chart for details).        38 year old otherwise healthy female presents today for 3-day history of flulike illness.  Patient with sick contact at work.  She has had generalized body aches, rhinorrhea, congestion and generalized fatigue.  She is without focal pain, chest pain, shortness of breath, abdominal pain, vomiting or diarrhea.  Patient is very concerned about her potassium level today and asked that this be checked.  On arrival she is afebrile, not tachycardic, not hypotensive, well-appearing and in no acute distress.  Lungs are clear to auscultation bilaterally.  Patient without fever, cough or sputum production.  No indication for imaging at this time.  Pregnancy test negative BMP nonacute, potassium 3.9 CBC shows mild microcytic anemia however this appears improved from patient's last CBC.  Patient has just finished her menstrual cycle.  I have informed patient of these findings and encouraged primary care follow-up.  She is not symptomatic at this time.  I have encouraged patient to take iron supplement as well as follow-up with PCP.  Additionally offered patient HIV testing today for her mild leukopenia however she refuses today and would prefer to follow-up with her primary care provider.  Additionally patient without symptoms of GI bleed, denies change to stool color, melena or blood in the stool. - Patient states  understanding of work-up today and care plan.  She does not wish to be tested for  influenza today, she states that she understands she is out of the window for Tamiflu.  I have encouraged patient to increase her water intake and rest.  I have also discussed OTC anti-inflammatories with the patient.  Patient was given 650 mg Tylenol here today.  She endorses improvement of her symptoms and is requesting work note and discharge.  At this time there does not appear to be any evidence of an acute emergency medical condition and the patient appears stable for discharge with appropriate outpatient follow up. Diagnosis was discussed with patient who verbalizes understanding of care plan and is agreeable to discharge. I have discussed return precautions with patient who verbalizes understanding of return precautions. Patient strongly encouraged to follow-up with their PCP. All questions answered.  Patient has been discharged in good condition.  Note: Portions of this report may have been transcribed using voice recognition software. Every effort was made to ensure accuracy; however, inadvertent computerized transcription errors may still be present. Final Clinical Impressions(s) / ED Diagnoses   Final diagnoses:  Influenza-like illness    ED Discharge Orders    None       Elizabeth Palau 04/17/18 2018    Lorre Nick, MD 04/18/18 2253

## 2018-04-17 NOTE — ED Triage Notes (Signed)
Pt reports congestion, fatigue and body aches x 3 days. Reports been around her boss who was sick.

## 2018-04-17 NOTE — Discharge Instructions (Addendum)
You have been diagnosed today with influenza-like illness.  At this time there does not appear to be the presence of an emergent medical condition, however there is always the potential for conditions to change. Please read and follow the below instructions.  Please return to the Emergency Department immediately for any new or worsening symptoms or if your symptoms do not improve within 3 days. Please be sure to follow up with your Primary Care Provider within one week regarding your visit today; please call their office to schedule an appointment even if you are feeling better for a follow-up visit. Please drink plenty water and get plenty of rest to help with your symptoms. Please take Ibuprofen (Advil, motrin) and Tylenol (acetaminophen) to relieve your pain.  You may take up to 400 MG (2 pills) of normal strength ibuprofen every 8 hours as needed.  In between doses of ibuprofen you make take tylenol, up to 500 mg (one extra strength pill).  Do not take more than 3,000 mg tylenol in a 24 hour period.  Please check all medication labels as many medications such as pain and cold medications may contain tylenol.  Do not drink alcohol while taking these medications.  Do not take other NSAID'S while taking ibuprofen (such as aleve or naproxen).  Please take ibuprofen with food to decrease stomach upset. Your red blood cell and white blood cell counts were slightly low today.  Please discuss these with your primary care provider.  Further blood test may be done by your primary care provider to look for causes of low white blood cell counts including HIV.  Get help right away if: You develop shortness of breath or difficulty breathing. Your skin or nails turn a bluish color. You have severe pain or stiffness in your neck. You develop a sudden headache or sudden pain in your face or ear. You cannot eat or drink without vomiting. Get help right away if: You are very weak. You are short of breath. You  have pain in your abdomen or chest. You are dizzy or feel faint. You have trouble concentrating. You have bloody or black, tarry stools. You vomit repeatedly or you vomit up blood. Get help right away if: You pass out (faint). If this happens, do not drive yourself to the hospital. Call your local emergency services (911 in the [place] You have chest pain. You have shortness of breath that: Is very bad. Gets worse with physical activity. You have a fast heartbeat. You get light-headed when getting up from sitting or lying down.   Please read the additional information packets attached to your discharge summary.  Do not take your medicine if  develop an itchy rash, swelling in your mouth or lips, or difficulty breathing.

## 2018-06-30 ENCOUNTER — Other Ambulatory Visit: Payer: Self-pay

## 2018-07-04 ENCOUNTER — Ambulatory Visit
Admission: EM | Admit: 2018-07-04 | Discharge: 2018-07-04 | Disposition: A | Discharge status: Home / Self Care | Payer: BLUE CROSS/BLUE SHIELD | Attending: Physician Assistant | Admitting: Physician Assistant

## 2018-07-04 DIAGNOSIS — G5601 Carpal tunnel syndrome, right upper limb: Secondary | ICD-10-CM

## 2018-07-04 MED ORDER — MELOXICAM 15 MG PO TABS: 15.0000 mg | ORAL_TABLET | Freq: Every day | ORAL | 2 refills | Status: DC

## 2018-07-04 NOTE — ED Triage Notes (Signed)
Pt c/o swelling and pain to rt hand. States has tingling and numbness to both hands for the past year, wore a brace with no relief

## 2018-07-04 NOTE — Discharge Instructions (Signed)
Schedule to see Dr. Coley for evaluation  °

## 2018-07-06 NOTE — ED Provider Notes (Signed)
[name]    CSN: [id number] Arrival date & time: 07/04/18  1435     History   Chief Complaint Chief Complaint  Patient presents with  . Hand Pain    HPI Amy Guerrero is a 38 y.o. female.   The history is provided by the patient. No language interpreter was used.  Hand Pain  This is a recurrent problem. The current episode started more than 1 week ago. The problem occurs constantly. The problem has been gradually worsening. Nothing aggravates the symptoms. Nothing relieves the symptoms. She has tried nothing for the symptoms. The treatment provided moderate relief.  Pt reports she has ongoing pain in her right hand.  Pt does repetitious work.  Pt states she has tingling and numbness  Past Medical History:  Diagnosis Date  . Asthma     There are no active problems to display for this patient.   Past Surgical History:  Procedure Laterality Date  . tubes tied      OB History   No obstetric history on file.      Home Medications    Prior to Admission medications   Medication Sig Start Date End Date Taking? Authorizing Provider  aspirin EC 325 MG tablet Take 325 mg by mouth daily as needed for moderate pain.    [provider]  erythromycin ophthalmic ointment Place a 1/2 inch ribbon of ointment into the lower eyelid 4-6 times per day. Patient not taking: Reported on 06/16/2017 11/10/16   Ward, Chase PicketJaime Pilcher, PA-C  HYDROcodone-acetaminophen (NORCO/VICODIN) 5-325 MG per tablet Take 2 tablets by mouth every 4 (four) hours as needed. Patient not taking: Reported on 06/16/2017 01/14/14   Elson AreasSofia, Shjon Lizarraga K, PA-C  ibuprofen (ADVIL,MOTRIN) 800 MG tablet Take 1 tablet (800 mg total) by mouth 3 (three) times daily. Patient not taking: Reported on 06/16/2017 01/14/14   Elson AreasSofia, Nikki Rusnak K, PA-C  meloxicam (MOBIC) 15 MG tablet Take 1 tablet (15 mg total) by mouth daily. 07/04/18 07/04/19  Elson AreasSofia, Kendelle Schweers K, PA-C  ondansetron (ZOFRAN) 4 MG tablet Take 1 tablet (4 mg  total) by mouth every 6 (six) hours. Patient not taking: Reported on 06/16/2017 12/02/13   Gwyneth SproutPlunkett, Whitney, MD  Phenylephrine-Pheniramine-DM [facility](THERAFLU COLD & COUGH) 12-06-18 MG PACK Take 1 packet by mouth as needed (for cold symptoms).    [provider]    Family History Family History  Problem Relation Age of Onset  . Diabetes Mother   . Diabetes Father     Social History Social History   Tobacco Use  . Smoking status: Current Every Day Smoker    Packs/day: 0.50    Types: Cigarettes  . Smokeless tobacco: Never Used  Substance Use Topics  . Alcohol use: Yes  . Drug use: Yes    Types: Marijuana     Allergies   Patient has no known allergies.   Review of Systems Review of Systems  All other systems reviewed and are negative.    Physical Exam Triage Vital Signs ED Triage Vitals  Enc Vitals Group     BP 07/04/18 1454 (!) 146/75     Pulse Rate 07/04/18 1454 78     Resp 07/04/18 1454 18     Temp 07/04/18 1454 97.9 F (36.6 C)     Temp Source 07/04/18 1454 Oral     SpO2 07/04/18 1454 98 %     Weight --      Height --      Head Circumference --  Peak Flow --      Pain Score 07/04/18 1455 6     Pain Loc --      Pain Edu? --      Excl. in [place]? --    No data found.  Updated Vital Signs BP (!) 146/75 (BP Location: Left Arm)   Pulse 78   Temp 97.9 F (36.6 C) (Oral)   Resp 18   LMP 07/04/2018   SpO2 98%   Visual Acuity Right Eye Distance:   Left Eye Distance:   Bilateral Distance:    Right Eye Near:   Left Eye Near:    Bilateral Near:     Physical Exam Vitals signs and nursing note reviewed.  Constitutional:      Appearance: She is well-developed.  HENT:     Head: Normocephalic.  Neck:     Musculoskeletal: Normal range of motion.  Cardiovascular:     Rate and Rhythm: Normal rate.  Pulmonary:     Effort: Pulmonary effort is normal.  Abdominal:     General: There is no distension.  Musculoskeletal: Normal range of motion.         General: Tenderness and signs of injury present. No swelling.     Comments: Positive tinnels,    Skin:    General: Skin is warm.  Neurological:     General: No focal deficit present.     Mental Status: She is alert and oriented to person, place, and time.  Psychiatric:        Mood and Affect: Mood normal.      UC Treatments / Results  Labs (all labs ordered are listed, but only abnormal results are displayed) Labs Reviewed - No data to display  EKG None  Radiology No results found.  Procedures Procedures (including critical care time)  Medications Ordered in UC Medications - No data to display  Initial Impression / Assessment and Plan / UC Course  I have reviewed the triage vital signs and the nursing notes.  Pertinent labs & imaging results that were available during my care of the patient were reviewed by me and considered in my medical decision making (see chart for details).     MDM  I suspect carpal tunnel.  Pt advied to obtasin carpal tunnel splint at pharmacy as we do not have.   Rx for meloxicam  Final Clinical Impressions(s) / UC Diagnoses   Final diagnoses:  Carpal tunnel syndrome of right wrist     Discharge Instructions     Schedule to see Dr. Izora Ribas for evaluation    ED Prescriptions    Medication Sig Dispense Auth. Provider   meloxicam (MOBIC) 15 MG tablet Take 1 tablet (15 mg total) by mouth daily. 30 tablet Elson Areas, [place]     Controlled Substance Prescriptions [place] Controlled Substance Registry consulted? Not Applicable  An After Visit Summary was printed and given to the patient.    Elson Areas, [place] 07/06/18 [phone]

## 2019-01-23 ENCOUNTER — Ambulatory Visit
Admission: EM | Admit: 2019-01-23 | Discharge: 2019-01-23 | Disposition: A | Discharge status: Home / Self Care | Payer: BC Managed Care – PPO | Attending: Physician Assistant | Admitting: Physician Assistant

## 2019-01-23 ENCOUNTER — Other Ambulatory Visit: Payer: Self-pay

## 2019-01-23 DIAGNOSIS — Z20828 Contact with and (suspected) exposure to other viral communicable diseases: Secondary | ICD-10-CM | POA: Diagnosis not present

## 2019-01-23 DIAGNOSIS — R0981 Nasal congestion: Secondary | ICD-10-CM

## 2019-01-23 DIAGNOSIS — R059 Cough, unspecified: Secondary | ICD-10-CM

## 2019-01-23 DIAGNOSIS — R05 Cough: Secondary | ICD-10-CM | POA: Diagnosis not present

## 2019-01-23 NOTE — ED Triage Notes (Signed)
Pt c/o head and nasal congestion with a productive cough since yesterday

## 2019-01-23 NOTE — Discharge Instructions (Addendum)
COVID testing ordered. I would like you to quarantine until testing results. You can take over the counter flonase/nasacort to help with nasal congestion/drainage. Can also use zyrtec. If experiencing shortness of breath, trouble breathing, go to the emergency department for further evaluation needed.

## 2019-01-23 NOTE — ED Provider Notes (Signed)
[name]    CSN: [id number] Arrival date & time: 01/23/19  1245      History   Chief Complaint Chief Complaint  Patient presents with  . Nasal Congestion    HPI Amy Guerrero is a 38 y.o. female.   38 year old female comes in for 2 day history of URI symptoms. Has had rhinorrhea, nasal congestion. States coughing mildly. Denies fever, chills, body aches. Denies abdominal pain, nausea, vomiting, diarrhea. Denies shortness of breath, loss of taste/smell. otc cold medicine with some relief. Never smoker.      Past Medical History:  Diagnosis Date  . Asthma     There are no active problems to display for this patient.   Past Surgical History:  Procedure Laterality Date  . tubes tied      OB History   No obstetric history on file.      Home Medications    Prior to Admission medications   Not on File    Family History Family History  Problem Relation Age of Onset  . Diabetes Mother   . Diabetes Father     Social History Social History   Tobacco Use  . Smoking status: Never Smoker  . Smokeless tobacco: Never Used  Substance Use Topics  . Alcohol use: Yes  . Drug use: Not Currently    Types: Marijuana     Allergies   Patient has no known allergies.   Review of Systems Review of Systems  Reason unable to perform ROS: See HPI as above.     Physical Exam Triage Vital Signs ED Triage Vitals [01/23/19 1306]  Enc Vitals Group     BP      Pulse Rate 75     Resp 16     Temp 99 F (37.2 C)     Temp Source Oral     SpO2 99 %     Weight      Height      Head Circumference      Peak Flow      Pain Score 0     Pain Loc      Pain Edu?      Excl. in [place]?    No data found.  Updated Vital Signs Pulse 75   Temp 99 F (37.2 C) (Oral)   Resp 16   LMP 01/18/2019   SpO2 99%   Physical Exam Constitutional:      General: She is not in acute distress.    Appearance: Normal appearance. She is not ill-appearing,  toxic-appearing or diaphoretic.  HENT:     Head: Normocephalic and atraumatic.     Nose:     Right Sinus: No maxillary sinus tenderness or frontal sinus tenderness.     Left Sinus: No maxillary sinus tenderness or frontal sinus tenderness.     Mouth/Throat:     Mouth: Mucous membranes are moist.     Pharynx: Oropharynx is clear. Uvula midline.  Neck:     Musculoskeletal: Normal range of motion and neck supple.  Cardiovascular:     Rate and Rhythm: Normal rate and regular rhythm.     Heart sounds: Normal heart sounds. No murmur. No friction rub. No gallop.   Pulmonary:     Effort: Pulmonary effort is normal. No accessory muscle usage, prolonged expiration, respiratory distress or retractions.     Comments: Lungs clear to auscultation without adventitious lung sounds. Neurological:     General: No focal deficit present.  Mental Status: She is alert and oriented to person, place, and time.      UC Treatments / Results  Labs (all labs ordered are listed, but only abnormal results are displayed) Labs Reviewed  NOVEL CORONAVIRUS, NAA    EKG   Radiology No results found.  Procedures Procedures (including critical care time)  Medications Ordered in UC Medications - No data to display  Initial Impression / Assessment and Plan / UC Course  I have reviewed the triage vital signs and the nursing notes.  Pertinent labs & imaging results that were available during my care of the patient were reviewed by me and considered in my medical decision making (see chart for details).    COVID testing ordered. Patient to quarantine until testing results return. No alarming signs on exam.  Patient speaking in full sentences without respiratory distress.  Symptomatic treatment discussed.  Push fluids.  Return precautions given.  Patient expresses understanding and agrees to plan.  Final Clinical Impressions(s) / UC Diagnoses   Final diagnoses:  Cough   ED Prescriptions    None      PDMP not reviewed this encounter.   Belinda Fisher, PA-C 01/23/19 1423

## 2019-01-25 LAB — NOVEL CORONAVIRUS, NAA: SARS-CoV-2, NAA: NOT DETECTED

## 2019-04-30 ENCOUNTER — Telehealth (HOSPITAL_COMMUNITY): Payer: Self-pay | Admitting: Emergency Medicine

## 2019-04-30 ENCOUNTER — Encounter (HOSPITAL_COMMUNITY): Payer: Self-pay

## 2019-04-30 ENCOUNTER — Other Ambulatory Visit: Payer: Self-pay

## 2019-04-30 ENCOUNTER — Emergency Department (HOSPITAL_COMMUNITY)
Admission: EM | Admit: 2019-04-30 | Discharge: 2019-04-30 | Disposition: A | Discharge status: Home / Self Care | Payer: BC Managed Care – PPO | Attending: Emergency Medicine | Admitting: Emergency Medicine

## 2019-04-30 ENCOUNTER — Emergency Department (HOSPITAL_COMMUNITY): Payer: BC Managed Care – PPO

## 2019-04-30 DIAGNOSIS — S81811A Laceration without foreign body, right lower leg, initial encounter: Secondary | ICD-10-CM | POA: Insufficient documentation

## 2019-04-30 DIAGNOSIS — Y9389 Activity, other specified: Secondary | ICD-10-CM | POA: Diagnosis not present

## 2019-04-30 DIAGNOSIS — Y998 Other external cause status: Secondary | ICD-10-CM | POA: Insufficient documentation

## 2019-04-30 DIAGNOSIS — S8991XA Unspecified injury of right lower leg, initial encounter: Secondary | ICD-10-CM | POA: Diagnosis present

## 2019-04-30 DIAGNOSIS — W25XXXA Contact with sharp glass, initial encounter: Secondary | ICD-10-CM | POA: Diagnosis not present

## 2019-04-30 DIAGNOSIS — Y929 Unspecified place or not applicable: Secondary | ICD-10-CM | POA: Insufficient documentation

## 2019-04-30 MED ORDER — IBUPROFEN 600 MG PO TABS: 600.0000 mg | ORAL_TABLET | Freq: Four times a day (QID) | ORAL | 0 refills | Status: DC | PRN

## 2019-04-30 MED ORDER — TETANUS-DIPHTH-ACELL PERTUSSIS 5-2.5-18.5 LF-MCG/0.5 IM SUSP
0.5000 mL | Freq: Once | INTRAMUSCULAR | Status: AC
  Administered 2019-04-30: 0.5 mL via INTRAMUSCULAR
  Filled 2019-04-30: qty 0.5

## 2019-04-30 MED ORDER — HYDROCODONE-ACETAMINOPHEN 5-325 MG PO TABS: 1.0000 | ORAL_TABLET | Freq: Four times a day (QID) | ORAL | 0 refills | Status: DC | PRN

## 2019-04-30 MED ORDER — HYDROCODONE-ACETAMINOPHEN 5-325 MG PO TABS
1.0000 | ORAL_TABLET | Freq: Once | ORAL | Status: AC
  Administered 2019-04-30: 1 via ORAL
  Filled 2019-04-30: qty 1

## 2019-04-30 MED ORDER — LIDOCAINE-EPINEPHRINE-TETRACAINE (LET) TOPICAL GEL
3.0000 mL | Freq: Once | TOPICAL | Status: AC
  Administered 2019-04-30: 3 mL via TOPICAL
  Filled 2019-04-30: qty 3

## 2019-04-30 MED ORDER — LIDOCAINE-EPINEPHRINE 2 %-1:100000 IJ SOLN
INTRAMUSCULAR | Status: AC
  Administered 2019-04-30: 1 mL
  Filled 2019-04-30: qty 1

## 2019-04-30 NOTE — ED Triage Notes (Addendum)
Laceration noted under R calf. States that it was from a piece of glass. Pt states that she cannot walk on that R leg d/t pain. Needs tetanus shot.

## 2019-04-30 NOTE — ED Provider Notes (Signed)
[facility]-EMERGENCY DEPT Provider Note   CSN: [id number] Arrival date & time: 04/30/19  0340     History Chief Complaint  Patient presents with  . Laceration    Amy Guerrero is a 39 y.o. female.  Patient to ED with laceration to posterior right lower leg. She states is was caused by broken glass but does not want to provide other details. Tetanus is out of date. No other injury.  The history is provided by the patient. No language interpreter was used.  Laceration      Past Medical History:  Diagnosis Date  . Asthma     There are no problems to display for this patient.   Past Surgical History:  Procedure Laterality Date  . tubes tied       OB History   No obstetric history on file.     Family History  Problem Relation Age of Onset  . Diabetes Mother   . Diabetes Father     Social History   Tobacco Use  . Smoking status: Never Smoker  . Smokeless tobacco: Never Used  Substance Use Topics  . Alcohol use: Yes  . Drug use: Not Currently    Types: Marijuana    Home Medications Prior to Admission medications   Not on File    Allergies    Patient has no known allergies.  Review of Systems   Review of Systems  Musculoskeletal:       See HPI.  Skin: Positive for wound.  Neurological: Negative for numbness.    Physical Exam Updated Vital Signs BP (!) 141/88 (BP Location: Right Arm)   Pulse 73   Temp 98.9 F (37.2 C) (Oral)   Resp 18   SpO2 98%   Physical Exam Constitutional:      Appearance: She is well-developed.  Pulmonary:     Effort: Pulmonary effort is normal.  Musculoskeletal:        General: Normal range of motion.     Cervical back: Normal range of motion.     Comments: No right leg bony abnormality.  Skin:    General: Skin is warm and dry.     Comments: 3 cm open laceration posterior right mid-shaft lower leg.   Neurological:     Mental Status: She is alert and oriented to person, place, and  time.     ED Results / Procedures / Treatments   Labs (all labs ordered are listed, but only abnormal results are displayed) Labs Reviewed - No data to display  EKG None  Radiology No results found.  Procedures .Marland KitchenLaceration Repair  Date/Time: 04/30/2019 6:26 AM Performed by: Elpidio Anis, PA-C Authorized by: Elpidio Anis, PA-C   Consent:    Consent obtained:  Verbal Anesthesia (see MAR for exact dosages):    Anesthesia method:  Local infiltration   Local anesthetic:  Lidocaine 2% WITH epi Laceration details:    Location:  Leg   Leg location:  R lower leg   Length (cm):  3 Repair type:    Repair type:  Simple Pre-procedure details:    Preparation:  Patient was prepped and draped in usual sterile fashion and imaging obtained to evaluate for foreign bodies Exploration:    Hemostasis achieved with:  Direct pressure   Wound exploration: wound explored through full range of motion     Wound extent: no foreign bodies/material noted and no tendon damage noted   Treatment:    Area cleansed with:  Betadine  Amount of cleaning:  Standard   Irrigation solution:  Sterile saline Skin repair:    Repair method:  Sutures   Suture size:  3-0   Suture material:  Nylon   Suture technique:  Simple interrupted   Number of sutures:  6 Approximation:    Approximation:  Close Post-procedure details:    Dressing:  Antibiotic ointment and non-adherent dressing   Patient tolerance of procedure:  Tolerated well, no immediate complications   (including critical care time)  Medications Ordered in ED Medications  lidocaine-EPINEPHrine-tetracaine (LET) topical gel (has no administration in time range)  Tdap (BOOSTRIX) injection 0.5 mL (has no administration in time range)  HYDROcodone-acetaminophen (NORCO/VICODIN) 5-325 MG per tablet 1 tablet (has no administration in time range)    ED Course  I have reviewed the triage vital signs and the nursing notes.  Pertinent labs &  imaging results that were available during my care of the patient were reviewed by me and considered in my medical decision making (see chart for details).    MDM Rules/Calculators/A&P                      Patient to ED with laceration to right lower leg by broken glass. No other injury.   No FB on imaging or wound inspection. Wound closed as per above note. Tetanus updated.  Final Clinical Impression(s) / ED Diagnoses Final diagnoses:  None   1. Right lower leg laceration  Rx / DC Orders ED Discharge Orders    None       Dennie Bible 04/30/19 6811    Palumbo, April, MD 04/30/19 [phone]

## 2019-04-30 NOTE — Discharge Instructions (Signed)
Sutures need to be removed in 10 days. REturn sooner with any sign of infection - fever, increasing pain or redness, drainage from the wound.

## 2019-05-02 ENCOUNTER — Encounter (HOSPITAL_COMMUNITY): Payer: Self-pay | Admitting: Emergency Medicine

## 2019-05-02 ENCOUNTER — Ambulatory Visit (INDEPENDENT_AMBULATORY_CARE_PROVIDER_SITE_OTHER)
Admission: EM | Admit: 2019-05-02 | Discharge: 2019-05-02 | Disposition: A | Discharge status: Home / Self Care | Payer: BC Managed Care – PPO | Source: Home / Self Care

## 2019-05-02 ENCOUNTER — Emergency Department (HOSPITAL_COMMUNITY)
Admission: EM | Admit: 2019-05-02 | Discharge: 2019-05-02 | Disposition: A | Discharge status: Home / Self Care | Payer: BC Managed Care – PPO | Attending: Emergency Medicine | Admitting: Emergency Medicine

## 2019-05-02 ENCOUNTER — Ambulatory Visit: Payer: Self-pay | Admitting: *Deleted

## 2019-05-02 ENCOUNTER — Other Ambulatory Visit: Payer: Self-pay

## 2019-05-02 DIAGNOSIS — M79671 Pain in right foot: Secondary | ICD-10-CM | POA: Insufficient documentation

## 2019-05-02 DIAGNOSIS — Z5321 Procedure and treatment not carried out due to patient leaving prior to being seen by health care provider: Secondary | ICD-10-CM | POA: Insufficient documentation

## 2019-05-02 DIAGNOSIS — R200 Anesthesia of skin: Secondary | ICD-10-CM

## 2019-05-02 DIAGNOSIS — S81811A Laceration without foreign body, right lower leg, initial encounter: Secondary | ICD-10-CM | POA: Insufficient documentation

## 2019-05-02 DIAGNOSIS — S81811D Laceration without foreign body, right lower leg, subsequent encounter: Secondary | ICD-10-CM | POA: Diagnosis not present

## 2019-05-02 DIAGNOSIS — M79606 Pain in leg, unspecified: Secondary | ICD-10-CM | POA: Diagnosis present

## 2019-05-02 DIAGNOSIS — W25XXXA Contact with sharp glass, initial encounter: Secondary | ICD-10-CM | POA: Insufficient documentation

## 2019-05-02 MED ORDER — KETOROLAC TROMETHAMINE 15 MG/ML IJ SOLN
15.0000 mg | Freq: Once | INTRAMUSCULAR | Status: AC
  Administered 2019-05-02: 12:00:00 15 mg via INTRAMUSCULAR

## 2019-05-02 MED ORDER — DICLOFENAC SODIUM 50 MG PO TBEC: 50.0000 mg | DELAYED_RELEASE_TABLET | Freq: Two times a day (BID) | ORAL | 0 refills | Status: DC

## 2019-05-02 NOTE — ED Triage Notes (Signed)
Pt states cut her rt calf on glass 2 days ago and received 6 sutures at [facility]. States now having numbness to RLE and sharp pain shooting up from foot to calf area. States unable to bare weight.states needs a work note.

## 2019-05-02 NOTE — ED Provider Notes (Signed)
[name]    CSN: [id number] Arrival date & time: 05/02/19  1115      History   Chief Complaint Chief Complaint  Patient presents with  . Foot Pain    HPI Amy Guerrero is a 39 y.o. female.   39 year old female comes in for right foot pain/numbness.  Patient sustained a laceration from glass 2 days ago to the right posterior leg.  She went to the emergency department, where x-ray was obtained with negative foreign body.  6 sutures were placed, and tetanus was updated.  Area was dressed at the time, and was told to leave on for 24 to 48 hours.  Patient states after sustaining laceration, she had numbness to the foot that has continued.  She now has right foot pain with certain movement that she describes as shooting/shocking pain up the leg.  She denies any obvious swelling, erythema, warmth.  She has not removed dressing, but has not noticed any leakage.  Denies fever, chills, body aches.  She has been taking ibuprofen, hydrocodone that was prescribed without significant relief.     Past Medical History:  Diagnosis Date  . Asthma     There are no problems to display for this patient.   Past Surgical History:  Procedure Laterality Date  . tubes tied      OB History   No obstetric history on file.      Home Medications    Prior to Admission medications   Medication Sig Start Date End Date Taking? Authorizing Provider  diclofenac (VOLTAREN) 50 MG EC tablet Take 1 tablet (50 mg total) by mouth 2 (two) times daily. 05/02/19   Ok Edwards, PA-C  HYDROcodone-acetaminophen (NORCO/VICODIN) 5-325 MG tablet Take 1 tablet by mouth every 6 (six) hours as needed for moderate pain. 04/30/19   Dalia Heading, PA-C    Family History Family History  Problem Relation Age of Onset  . Diabetes Mother   . Diabetes Father     Social History Social History   Tobacco Use  . Smoking status: Never Smoker  . Smokeless tobacco: Never Used  Substance Use Topics  .  Alcohol use: Yes  . Drug use: Not Currently    Types: Marijuana     Allergies   Patient has no known allergies.   Review of Systems Review of Systems  Reason unable to perform ROS: See HPI as above.     Physical Exam Triage Vital Signs ED Triage Vitals  Enc Vitals Group     BP 05/02/19 1132 (!) 163/95     Pulse Rate 05/02/19 1132 61     Resp 05/02/19 1132 16     Temp 05/02/19 1132 98 F (36.7 C)     Temp Source 05/02/19 1132 Oral     SpO2 05/02/19 1132 98 %     Weight --      Height --      Head Circumference --      Peak Flow --      Pain Score 05/02/19 1133 4     Pain Loc --      Pain Edu? --      Excl. in [place]? --    No data found.  Updated Vital Signs BP (!) 163/95 (BP Location: Left Arm)   Pulse 61   Temp 98 F (36.7 C) (Oral)   Resp 16   LMP 04/22/2019   SpO2 98%   Physical Exam Constitutional:  General: She is not in acute distress.    Appearance: Normal appearance. She is well-developed. She is not toxic-appearing or diaphoretic.  HENT:     Head: Normocephalic and atraumatic.  Eyes:     Conjunctiva/sclera: Conjunctivae normal.     Pupils: Pupils are equal, round, and reactive to light.  Pulmonary:     Effort: Pulmonary effort is normal. No respiratory distress.     Comments: Speaking in full sentences without difficulty Musculoskeletal:     Cervical back: Normal range of motion and neck supple.     Comments: Dressing removed from right lower leg which was slightly wet diffusely. Slight indentation of the leg due to dressing.  Laceration site clean and dry without erythema, warmth, drainage.  Right leg is cold diffusely around dressing as well as distal extremity.  No discoloration noted.  Pedal pulse 2+, cap refill less than 2 seconds.  Patient with hesitation of ankle range of motion due to possible pain, though able to move slowly if needing to.  Sensation intact to the right lower leg.  Decreased sensation of the right foot lateral > medial    Skin:    General: Skin is warm and dry.  Neurological:     Mental Status: She is alert and oriented to person, place, and time.      UC Treatments / Results  Labs (all labs ordered are listed, but only abnormal results are displayed) Labs Reviewed - No data to display  EKG   Radiology No results found.  Procedures Procedures (including critical care time)  Medications Ordered in UC Medications  ketorolac (TORADOL) 15 MG/ML injection 15 mg (15 mg Intramuscular Given 05/02/19 1147)    Initial Impression / Assessment and Plan / UC Course  I have reviewed the triage vital signs and the nursing notes.  Pertinent labs & imaging results that were available during my care of the patient were reviewed by me and considered in my medical decision making (see chart for details).    Discussed case with Dr. Leonides Grills.  Laceration site is clean and dry, sutures intact, no signs of cellulitis/drainage.  Patient's leg is cool to touch distal of laceration site, though with good pulses. ?  Tight dressing causing symptoms.  Discussed shooting pain could be due to nerve involvement from laceration or inflammation.  At this time, will treat symptomatically with Toradol injection, diclofenac, ice compress.  Will provide CAM Walker for further symptomatic relief.  Patient to follow-up with sports medicine/orthopedics for further evaluation of foot numbness/cold extremity.  Return precautions given.  Patient expresses understanding and agrees to plan.  Final Clinical Impressions(s) / UC Diagnoses   Final diagnoses:  Numbness of right foot  Right foot pain  Laceration of right lower leg, initial encounter   ED Prescriptions    Medication Sig Dispense Auth. Provider   diclofenac (VOLTAREN) 50 MG EC tablet Take 1 tablet (50 mg total) by mouth 2 (two) times daily. 20 tablet Belinda Fisher, PA-C     I have reviewed the PDMP during this encounter.   Belinda Fisher, PA-C 05/02/19 1431

## 2019-05-02 NOTE — Telephone Encounter (Signed)
Pt called stating she was seen in ED on 04/30/19 for a laceration on her leg; she received 6 stitches; she complains of right foot numbness that was present in the ED; she states that she still can not bear any weight on that foot, and continues to use the crutches that she got in the ED; recommendations made per nurse triage protocol; she verbalized understanding, and will proceed to the ED.  Reason for Disposition . Patient sounds very sick or weak to the triager  Answer Assessment - Initial Assessment Questions 1. SYMPTOM: "What is the main symptom you are concerned about?" (e.g., weakness, numbness)     Right foot numbness 2. ONSET: "When did this start?" (minutes, hours, days; while sleeping)    04/30/19 3. LAST NORMAL: "When was the last time you were normal (no symptoms)?"   04/30/19 4. PATTERN "Does this come and go, or has it been constant since it started?"  "Is it present now?"     Constant; present now 5. CARDIAC SYMPTOMS: "Have you had any of the following symptoms: chest pain, difficulty breathing, palpitations?"    no 6. NEUROLOGIC SYMPTOMS: "Have you had any of the following symptoms: headache, dizziness, vision loss, double vision, changes in speech, unsteady on your feet?"      no 7. OTHER SYMPTOMS: "Do you have any other symptoms?"    Shocking pain in right foot 8. PREGNANCY: "Is there any chance you are pregnant?" "When was your last menstrual period?"  04/23/19  Protocols used: NEUROLOGIC DEFICIT-A-AH

## 2019-05-02 NOTE — Discharge Instructions (Signed)
Toradol injection in office today. Stop ibuprofen and take diclofenac as directed for the next 5-10 days. Do not take ibuprofen (motrin/advil)/ naproxen (aleve) while on diclofenac. Ice compress to the wound for swelling. Daily dressings if needed, otherwise, can leave wound open. Clean wound gently with soap and water daily. If noticing discharge, redness, warmth, follow up for reevaluation. Otherwise, follow up with sports medicine/orthopedics in the next week for further evaluation of numbness/cold extremity.

## 2019-05-02 NOTE — ED Triage Notes (Signed)
Pt reports that she was here on 3/14 for a laceration to her right lower leg. Reports when she was here then she told the doctor about her right foot being numb. Reports the doctor ignored her then and still having numbness in right foot and can't bear weight on it.

## 2019-05-09 NOTE — Telephone Encounter (Signed)
done

## 2019-05-10 ENCOUNTER — Ambulatory Visit
Admission: EM | Admit: 2019-05-10 | Discharge: 2019-05-10 | Disposition: A | Discharge status: Home / Self Care | Payer: BC Managed Care – PPO

## 2019-05-10 DIAGNOSIS — Z4802 Encounter for removal of sutures: Secondary | ICD-10-CM | POA: Diagnosis not present

## 2019-05-10 NOTE — ED Triage Notes (Signed)
6 sutures removed with no drainage and no redness.

## 2019-05-15 ENCOUNTER — Ambulatory Visit
Admission: EM | Admit: 2019-05-15 | Discharge: 2019-05-15 | Disposition: A | Discharge status: Home / Self Care | Payer: BC Managed Care – PPO

## 2019-08-27 ENCOUNTER — Encounter (HOSPITAL_COMMUNITY): Payer: Self-pay

## 2019-08-27 ENCOUNTER — Other Ambulatory Visit: Payer: Self-pay

## 2019-08-27 ENCOUNTER — Ambulatory Visit (HOSPITAL_COMMUNITY)
Admission: EM | Admit: 2019-08-27 | Discharge: 2019-08-27 | Disposition: A | Discharge status: Home / Self Care | Payer: BC Managed Care – PPO | Attending: Family Medicine | Admitting: Family Medicine

## 2019-08-27 DIAGNOSIS — R197 Diarrhea, unspecified: Secondary | ICD-10-CM

## 2019-08-27 DIAGNOSIS — R112 Nausea with vomiting, unspecified: Secondary | ICD-10-CM | POA: Diagnosis not present

## 2019-08-27 DIAGNOSIS — K529 Noninfective gastroenteritis and colitis, unspecified: Secondary | ICD-10-CM | POA: Diagnosis not present

## 2019-08-27 MED ORDER — ONDANSETRON HCL 4 MG PO TABS: 4.0000 mg | ORAL_TABLET | Freq: Four times a day (QID) | ORAL | 0 refills | Status: DC

## 2019-08-27 MED ORDER — LOPERAMIDE HCL 2 MG PO CAPS: 2.0000 mg | ORAL_CAPSULE | Freq: Four times a day (QID) | ORAL | 0 refills | Status: DC | PRN

## 2019-08-27 NOTE — Discharge Instructions (Addendum)
I have sent in some Zofran to your pharmacy to use for nausea  I have also sent in loperamide for you to use for diarrhea  Continue drinking electrolyte containing fluids, and getting plenty of rest  Follow-up with this office or with primary care if your symptoms are persisting  Follow-up with the ER for feeling dehydrated, being unable to urinate, trouble swallowing, trouble breathing, other concerning symptoms

## 2019-08-27 NOTE — ED Triage Notes (Signed)
Pt presents to UC for n/v/d x2 days. Pt states she feels weak and dehydrated related to symptoms. Pt denies fever, chills, body aches, sick contacts. Pt denies OTC treatment or relieving factors.

## 2019-08-29 NOTE — ED Provider Notes (Signed)
[facility]   [id number] 08/27/19 Arrival Time: 1637  CC: ABDOMINAL PAIN  SUBJECTIVE:  Amy Guerrero is a 39 y.o. female who presents with complaint of abdominal discomfort that began abruptly 2 days ago.  Denies a precipitating event, trauma, close contacts with similar symptoms, recent travel or antibiotic use.  Reports nausea and vomiting. Has not taken OTC medications for this. Denies alleviating or aggravating factors. Denies similar symptoms in the past. Last BM today.  Has been drinking Gatorade and Powerade to help replace fluid loss.  Reports that she went to a cookout last weekend, and she thinks that she may have eaten something that was not thoroughly cooked.  Denies fever, chills, appetite changes, weight changes, chest pain, SOB, constipation, hematochezia, melena, dysuria, difficulty urinating, increased frequency or urgency, flank pain, loss of bowel or bladder function, vaginal discharge, vaginal odor, vaginal bleeding, dyspareunia, pelvic pain.     Patient's last menstrual period was 07/24/2019 (approximate).  ROS: As per HPI.  All other pertinent ROS negative.     Past Medical History:  Diagnosis Date  . Asthma    Past Surgical History:  Procedure Laterality Date  . tubes tied     No Known Allergies No current facility-administered medications on file prior to encounter.   Current Outpatient Medications on File Prior to Encounter  Medication Sig Dispense Refill  . diclofenac (VOLTAREN) 50 MG EC tablet Take 1 tablet (50 mg total) by mouth 2 (two) times daily. 20 tablet 0  . HYDROcodone-acetaminophen (NORCO/VICODIN) 5-325 MG tablet Take 1 tablet by mouth every 6 (six) hours as needed for moderate pain. 6 tablet 0   Social History   Socioeconomic History  . Marital status: Single    Spouse name: Not on file  . Number of children: Not on file  . Years of education: Not on file  . Highest education level: Not on file  Occupational History  . Not on  file  Tobacco Use  . Smoking status: Never Smoker  . Smokeless tobacco: Never Used  Vaping Use  . Vaping Use: Never used  Substance and Sexual Activity  . Alcohol use: Yes  . Drug use: Not Currently    Types: Marijuana  . Sexual activity: Not on file  Other Topics Concern  . Not on file  Social History Narrative  . Not on file   Social Determinants of Health   Financial Resource Strain:   . Difficulty of Paying Living Expenses:   Food Insecurity:   . Worried About Programme researcher, broadcasting/film/video in the Last Year:   . Barista in the Last Year:   Transportation Needs:   . Freight forwarder (Medical):   Marland Kitchen Lack of Transportation (Non-Medical):   Physical Activity:   . Days of Exercise per Week:   . Minutes of Exercise per Session:   Stress:   . Feeling of Stress :   Social Connections:   . Frequency of Communication with Friends and Family:   . Frequency of Social Gatherings with Friends and Family:   . Attends Religious Services:   . Active Member of Clubs or Organizations:   . Attends Banker Meetings:   Marland Kitchen Marital Status:   Intimate Partner Violence:   . Fear of Current or Ex-Partner:   . Emotionally Abused:   Marland Kitchen Physically Abused:   . Sexually Abused:    Family History  Problem Relation Age of Onset  . Diabetes Mother   . Diabetes  Father      OBJECTIVE:  Vitals:   08/27/19 1743  BP: 137/72  Pulse: 78  Resp: 16  Temp: 98.2 F (36.8 C)  TempSrc: Oral  SpO2: 100%    General appearance: Alert; NAD HEENT: NCAT.  Oropharynx clear.  Lungs: clear to auscultation bilaterally without adventitious breath sounds Heart: regular rate and rhythm.  Radial pulses 2+ symmetrical bilaterally Abdomen: soft, non-distended; normal active bowel sounds; non-tender to light and deep palpation; nontender at McBurney's point; negative Murphy's sign; negative rebound; no guarding Back: no CVA tenderness Extremities: no edema; symmetrical with no gross  deformities Skin: warm and dry Neurologic: normal gait Psychological: alert and cooperative; normal mood and affect  LABS: No results found for this or any previous visit (from the past 24 hour(s)).  DIAGNOSTIC STUDIES: No results found.   ASSESSMENT & PLAN:  1. Noninfectious gastroenteritis, unspecified type   2. Diarrhea, unspecified type   3. Nausea and vomiting, intractability of vomiting not specified, unspecified vomiting type     Meds ordered this encounter  Medications  . ondansetron (ZOFRAN) 4 MG tablet    Sig: Take 1 tablet (4 mg total) by mouth every 6 (six) hours.    Dispense:  12 tablet    Refill:  0    Order Specific Question:   Supervising Provider    Answer:   Merrilee Jansky [id number]  . loperamide (IMODIUM) 2 MG capsule    Sig: Take 1 capsule (2 mg total) by mouth 4 (four) times daily as needed for diarrhea or loose stools.    Dispense:  12 capsule    Refill:  0    Order Specific Question:   Supervising Provider    Answer:   Merrilee Jansky [id number]     Get rest and drink fluids Zofran prescribed.  Take as directed.   Loperamide prescribed Work note provided  DIET Instructions:  30 minutes after taking nausea medicine, begin with sips of clear liquids. If able to hold down 2 - 4 ounces for 30 minutes, begin drinking more. Increase your fluid intake to replace losses. Clear liquids only for 24 hours (water, tea, sport drinks, clear flat ginger ale or cola and juices, broth, jello, popsicles, ect). Advance to bland foods, applesauce, rice, baked or boiled chicken, ect. Avoid milk, greasy foods and anything that doesn't agree with you.  If you experience new or worsening symptoms return or go to ER such as fever, chills, nausea, vomiting, diarrhea, bloody or dark tarry stools, constipation, urinary symptoms, worsening abdominal discomfort, symptoms that do not improve with medications, inability to keep fluids down.  Reviewed expectations re:  course of current medical issues. Questions answered. Outlined signs and symptoms indicating need for more acute intervention. Patient verbalized understanding. After Visit Summary given.    Moshe Cipro, NP 08/29/19 1441

## 2019-10-16 ENCOUNTER — Ambulatory Visit
Admission: EM | Admit: 2019-10-16 | Discharge: 2019-10-16 | Disposition: A | Discharge status: Home / Self Care | Payer: BC Managed Care – PPO | Attending: Emergency Medicine | Admitting: Emergency Medicine

## 2019-10-16 DIAGNOSIS — Z1152 Encounter for screening for COVID-19: Secondary | ICD-10-CM | POA: Diagnosis not present

## 2019-10-16 DIAGNOSIS — R0981 Nasal congestion: Secondary | ICD-10-CM | POA: Diagnosis not present

## 2019-10-16 DIAGNOSIS — H04203 Unspecified epiphora, bilateral lacrimal glands: Secondary | ICD-10-CM

## 2019-10-16 MED ORDER — CETIRIZINE HCL 10 MG PO CAPS: 10.0000 mg | ORAL_CAPSULE | Freq: Every day | ORAL | 0 refills | Status: DC

## 2019-10-16 MED ORDER — GUAIFENESIN ER 600 MG PO TB12: 600.0000 mg | ORAL_TABLET | Freq: Two times a day (BID) | ORAL | 0 refills | Status: AC

## 2019-10-16 MED ORDER — PREDNISONE 20 MG PO TABS: 40.0000 mg | ORAL_TABLET | Freq: Every day | ORAL | 0 refills | Status: AC

## 2019-10-16 MED ORDER — OLOPATADINE HCL 0.1 % OP SOLN: 1.0000 [drp] | Freq: Two times a day (BID) | OPHTHALMIC | 12 refills | Status: DC

## 2019-10-16 NOTE — ED Triage Notes (Signed)
Pt c/o headache, nasal congestion, watery eyes, and sneezing since Saturday. States her live in boyfriend had a positive covid exposure. Pt requesting covid testing.

## 2019-10-16 NOTE — ED Provider Notes (Signed)
[name]    CSN: [id number] Arrival date & time: 10/16/19  1624      History   Chief Complaint Chief Complaint  Patient presents with  . Headache    HPI Amy Guerrero is a 39 y.o. female history of asthma presenting today for evaluation of URI symptoms.  Patient reports she has had a headache, nasal congestion watery eyes and sneezing.  Symptoms have been going on for approximately 3 days since Saturday.  Reports that her boyfriend recently positive.  She denies significant cough chest pain or shortness of breath.  Using ibuprofen Aleve for symptoms.    HPI  Past Medical History:  Diagnosis Date  . Asthma     There are no problems to display for this patient.   Past Surgical History:  Procedure Laterality Date  . tubes tied      OB History   No obstetric history on file.      Home Medications    Prior to Admission medications   Medication Sig Start Date End Date Taking? Authorizing Provider  Cetirizine HCl 10 MG CAPS Take 1 capsule (10 mg total) by mouth daily for 10 days. 10/16/19 10/26/19  Afsana Liera C, PA-C  guaiFENesin (MUCINEX) 600 MG 12 hr tablet Take 1 tablet (600 mg total) by mouth 2 (two) times daily for 10 days. 10/16/19 10/26/19  Amad Mau C, PA-C  olopatadine (PATANOL) 0.1 % ophthalmic solution Place 1 drop into both eyes 2 (two) times daily. 10/16/19   Conor Filsaime C, PA-C  predniSONE (DELTASONE) 20 MG tablet Take 2 tablets (40 mg total) by mouth daily with breakfast for 5 days. 10/16/19 10/21/19  Merek Niu, Junius Creamer, PA-C    Family History Family History  Problem Relation Age of Onset  . Diabetes Mother   . Diabetes Father     Social History Social History   Tobacco Use  . Smoking status: Never Smoker  . Smokeless tobacco: Never Used  Vaping Use  . Vaping Use: Never used  Substance Use Topics  . Alcohol use: Yes  . Drug use: Not Currently    Types: Marijuana     Allergies   Patient has no known  allergies.   Review of Systems Review of Systems  Constitutional: Negative for activity change, appetite change, chills, fatigue and fever.  HENT: Positive for congestion and rhinorrhea. Negative for ear pain, sinus pressure, sore throat and trouble swallowing.   Eyes: Negative for discharge and redness.  Respiratory: Negative for cough, chest tightness and shortness of breath.   Cardiovascular: Negative for chest pain.  Gastrointestinal: Negative for abdominal pain, diarrhea, nausea and vomiting.  Musculoskeletal: Negative for myalgias.  Skin: Negative for rash.  Neurological: Positive for headaches. Negative for dizziness and light-headedness.     Physical Exam Triage Vital Signs ED Triage Vitals  Enc Vitals Group     BP 10/16/19 1917 129/86     Pulse Rate 10/16/19 1917 74     Resp 10/16/19 1917 16     Temp 10/16/19 1917 98.7 F (37.1 C)     Temp Source 10/16/19 1917 Oral     SpO2 10/16/19 1917 99 %     Weight --      Height --      Head Circumference --      Peak Flow --      Pain Score 10/16/19 1933 0     Pain Loc --      Pain Edu? --  Excl. in GC? --    No data found.  Updated Vital Signs BP 129/86 (BP Location: Left Arm)   Pulse 74   Temp 98.7 F (37.1 C) (Oral)   Resp 16   LMP 09/23/2019   SpO2 99%   Visual Acuity Right Eye Distance:   Left Eye Distance:   Bilateral Distance:    Right Eye Near:   Left Eye Near:    Bilateral Near:     Physical Exam Vitals and nursing note reviewed.  Constitutional:      Appearance: She is well-developed.     Comments: No acute distress  HENT:     Head: Normocephalic and atraumatic.     Ears:     Comments: Bilateral ears without tenderness to palpation of external auricle, tragus and mastoid, EAC's without erythema or swelling, TM's with good bony landmarks and cone of light. Non erythematous.     Nose: Nose normal.     Mouth/Throat:     Comments: Oral mucosa pink and moist, no tonsillar enlargement or  exudate. Posterior pharynx patent and nonerythematous, no uvula deviation or swelling. Normal phonation. Eyes:     Conjunctiva/sclera: Conjunctivae normal.  Cardiovascular:     Rate and Rhythm: Normal rate.  Pulmonary:     Effort: Pulmonary effort is normal. No respiratory distress.     Comments: Breathing comfortably at rest, CTABL, no wheezing, rales or other adventitious sounds auscultated Abdominal:     General: There is no distension.  Musculoskeletal:        General: Normal range of motion.     Cervical back: Neck supple.  Skin:    General: Skin is warm and dry.  Neurological:     Mental Status: She is alert and oriented to person, place, and time.      UC Treatments / Results  Labs (all labs ordered are listed, but only abnormal results are displayed) Labs Reviewed  NOVEL CORONAVIRUS, NAA    EKG   Radiology No results found.  Procedures Procedures (including critical care time)  Medications Ordered in UC Medications - No data to display  Initial Impression / Assessment and Plan / UC Course  I have reviewed the triage vital signs and the nursing notes.  Pertinent labs & imaging results that were available during my care of the patient were reviewed by me and considered in my medical decision making (see chart for details).     Covid test pending.  Treating symptomatically and supportively for likely viral etiology versus allergic rhinitis.  Prednisone for sinus pressure/inflammation, continue daily antihistamine, Mucinex.  Olopatadine for eye watering and itching.  Rest and fluids.  Discussed strict return precautions. Patient verbalized understanding and is agreeable with plan.  Final Clinical Impressions(s) / UC Diagnoses   Final diagnoses:  Encounter for screening for COVID-19  Nasal congestion  Watery eyes     Discharge Instructions     COVID test pending Prednisone daily x 5 days- take with food in the morning if able Daily cetirizine/zyrtec  or loratadine/Claritin Mucinex twice daily for congestion Olopatadine eye drops for watering/itching Rest and fluids Follow up if not improving or worsening    ED Prescriptions    Medication Sig Dispense Auth. Provider   predniSONE (DELTASONE) 20 MG tablet Take 2 tablets (40 mg total) by mouth daily with breakfast for 5 days. 10 tablet Yancy Knoble C, PA-C   guaiFENesin (MUCINEX) 600 MG 12 hr tablet Take 1 tablet (600 mg total) by mouth 2 (two) times  daily for 10 days. 20 tablet Burk Hoctor C, PA-C   Cetirizine HCl 10 MG CAPS Take 1 capsule (10 mg total) by mouth daily for 10 days. 10 capsule Guilherme Schwenke C, PA-C   olopatadine (PATANOL) 0.1 % ophthalmic solution Place 1 drop into both eyes 2 (two) times daily. 5 mL Jama Krichbaum, Brady C, PA-C     PDMP not reviewed this encounter.   Lew Dawes, PA-C 10/17/19 1006

## 2019-10-16 NOTE — Discharge Instructions (Signed)
COVID test pending Prednisone daily x 5 days- take with food in the morning if able Daily cetirizine/zyrtec or loratadine/Claritin Mucinex twice daily for congestion Olopatadine eye drops for watering/itching Rest and fluids Follow up if not improving or worsening

## 2019-10-19 LAB — NOVEL CORONAVIRUS, NAA: SARS-CoV-2, NAA: DETECTED — AB

## 2019-10-20 ENCOUNTER — Encounter: Payer: Self-pay | Admitting: Oncology

## 2019-10-20 ENCOUNTER — Other Ambulatory Visit: Payer: Self-pay | Admitting: Oncology

## 2019-10-20 DIAGNOSIS — U071 COVID-19: Secondary | ICD-10-CM

## 2019-10-20 MED ORDER — ALBUTEROL SULFATE HFA 108 (90 BASE) MCG/ACT IN AERS: 2.0000 | INHALATION_SPRAY | Freq: Four times a day (QID) | RESPIRATORY_TRACT | 2 refills | Status: DC | PRN

## 2019-10-20 NOTE — Progress Notes (Signed)
I connected by phone with  Miss. Geerdes to discuss the potential use of an new treatment for mild to moderate COVID-19 viral infection in non-hospitalized patients.   This patient is a age/sex that meets the FDA criteria for Emergency Use Authorization of casirivimab\imdevimab.  Has a (+) direct SARS-CoV-2 viral test result 1. Has mild or moderate COVID-19  2. Is ? 41 years of age and weighs ? 40 kg 3. Is NOT hospitalized due to COVID-19 4. Is NOT requiring oxygen therapy or requiring an increase in baseline oxygen flow rate due to COVID-19 5. Is within 10 days of symptom onset 6. Has at least one of the high risk factor(s) for progression to severe COVID-19 and/or hospitalization as defined in EUA. ? Specific high risk criteria :asthma   Symptom onset  10/14/19   I have spoken and communicated the following to the patient or parent/caregiver:   1. FDA has authorized the emergency use of casirivimab\imdevimab for the treatment of mild to moderate COVID-19 in adults and pediatric patients with positive results of direct SARS-CoV-2 viral testing who are 24 years of age and older weighing at least 40 kg, and who are at high risk for progressing to severe COVID-19 and/or hospitalization.   2. The significant known and potential risks and benefits of casirivimab\imdevimab, and the extent to which such potential risks and benefits are unknown.   3. Information on available alternative treatments and the risks and benefits of those alternatives, including clinical trials.   4. Patients treated with casirivimab\imdevimab should continue to self-isolate and use infection control measures (e.g., wear mask, isolate, social distance, avoid sharing personal items, clean and disinfect "high touch" surfaces, and frequent handwashing) according to CDC guidelines.    5. The patient or parent/caregiver has the option to accept or refuse casirivimab\imdevimab .   After reviewing this information with the  patient, The patient agreed to proceed with receiving casirivimab\imdevimab infusion and will be provided a copy of the Fact sheet prior to receiving the infusion.Mignon Pine, AGNP-C [phone] (Infusion Center Hotline)

## 2019-10-21 ENCOUNTER — Ambulatory Visit (HOSPITAL_COMMUNITY): Payer: BC Managed Care – PPO

## 2019-12-31 ENCOUNTER — Other Ambulatory Visit: Payer: Self-pay

## 2019-12-31 ENCOUNTER — Telehealth: Payer: Self-pay | Admitting: Emergency Medicine

## 2019-12-31 ENCOUNTER — Encounter: Payer: Self-pay | Admitting: Emergency Medicine

## 2019-12-31 ENCOUNTER — Ambulatory Visit
Admission: EM | Admit: 2019-12-31 | Discharge: 2019-12-31 | Disposition: A | Discharge status: Home / Self Care | Payer: BC Managed Care – PPO | Attending: Emergency Medicine | Admitting: Emergency Medicine

## 2019-12-31 DIAGNOSIS — S46811A Strain of other muscles, fascia and tendons at shoulder and upper arm level, right arm, initial encounter: Secondary | ICD-10-CM

## 2019-12-31 MED ORDER — CYCLOBENZAPRINE HCL 5 MG PO TABS: 5.0000 mg | ORAL_TABLET | Freq: Two times a day (BID) | ORAL | 0 refills | Status: DC | PRN

## 2019-12-31 MED ORDER — NAPROXEN 500 MG PO TABS: 500.0000 mg | ORAL_TABLET | Freq: Two times a day (BID) | ORAL | 0 refills | Status: DC

## 2019-12-31 MED ORDER — CYCLOBENZAPRINE HCL 5 MG PO TABS: 5.0000 mg | ORAL_TABLET | Freq: Two times a day (BID) | ORAL | 0 refills | Status: AC | PRN

## 2019-12-31 NOTE — ED Triage Notes (Signed)
Pt here for right shoulder pain and upper back pain near shoulder blade upon waking this am; pt denies hx of same or obvious injury

## 2019-12-31 NOTE — ED Provider Notes (Signed)
[name]    CSN: [id number] Arrival date & time: 12/31/19  1036      History   Chief Complaint Chief Complaint  Patient presents with  . Shoulder Pain  . Back Pain    HPI Amy Guerrero is a 39 y.o. female  Presenting for right neck and back pain since waking this morning.  Patient is right-hand dominant, works in [employer].  Denies inciting event or injury.  No neck pain, distal arm weakness or numbness.  Patient states pain is worse with abduction, extension and shrugging shoulders.  Has not tried thing for this.  Past Medical History:  Diagnosis Date  . Asthma     There are no problems to display for this patient.   Past Surgical History:  Procedure Laterality Date  . tubes tied      OB History   No obstetric history on file.      Home Medications    Prior to Admission medications   Medication Sig Start Date End Date Taking? Authorizing Provider  albuterol (VENTOLIN HFA) 108 (90 Base) MCG/ACT inhaler Inhale 2 puffs into the lungs every 6 (six) hours as needed for wheezing or shortness of breath. 10/20/19   Mauro Kaufmann, NP  Cetirizine HCl 10 MG CAPS Take 1 capsule (10 mg total) by mouth daily for 10 days. 10/16/19 10/26/19  Wieters, Hallie C, PA-C  cyclobenzaprine (FLEXERIL) 5 MG tablet Take 1 tablet (5 mg total) by mouth 2 (two) times daily as needed for up to 7 days for muscle spasms. 12/31/19 01/07/20  Hall-Potvin, [place], PA-C  naproxen (NAPROSYN) 500 MG tablet Take 1 tablet (500 mg total) by mouth 2 (two) times daily. 12/31/19   Hall-Potvin, [place], PA-C  olopatadine (PATANOL) 0.1 % ophthalmic solution Place 1 drop into both eyes 2 (two) times daily. 10/16/19   Wieters, Junius Creamer, PA-C    Family History Family History  Problem Relation Age of Onset  . Diabetes Mother   . Diabetes Father     Social History Social History   Tobacco Use  . Smoking status: Never Smoker  . Smokeless tobacco: Never Used  Vaping Use  . Vaping Use:  Never used  Substance Use Topics  . Alcohol use: Yes  . Drug use: Not Currently    Types: Marijuana     Allergies   Patient has no known allergies.   Review of Systems Review of Systems  Constitutional: Negative for fatigue and fever.  HENT: Negative for ear pain, sinus pain, sore throat and voice change.   Eyes: Negative for pain, redness and visual disturbance.  Respiratory: Negative for cough and shortness of breath.   Cardiovascular: Negative for chest pain and palpitations.  Gastrointestinal: Negative for abdominal pain, diarrhea and vomiting.  Musculoskeletal: Negative for myalgias.       Positive for right upper back and neck pain.  Skin: Negative for rash and wound.  Neurological: Negative for syncope and headaches.     Physical Exam Triage Vital Signs ED Triage Vitals  Enc Vitals Group     BP 12/31/19 1054 122/78     Pulse Rate 12/31/19 1054 79     Resp 12/31/19 1054 16     Temp 12/31/19 1054 99 F (37.2 C)     Temp Source 12/31/19 1054 Oral     SpO2 12/31/19 1054 98 %     Weight --      Height --      Head Circumference --  Peak Flow --      Pain Score 12/31/19 1056 7     Pain Loc --      Pain Edu? --      Excl. in GC? --    No data found.  Updated Vital Signs BP 122/78 (BP Location: Left Arm)   Pulse 79   Temp 99 F (37.2 C) (Oral)   Resp 16   SpO2 98%   Visual Acuity Right Eye Distance:   Left Eye Distance:   Bilateral Distance:    Right Eye Near:   Left Eye Near:    Bilateral Near:     Physical Exam Constitutional:      General: She is not in acute distress. HENT:     Head: Normocephalic and atraumatic.  Eyes:     General: No scleral icterus.    Pupils: Pupils are equal, round, and reactive to light.  Cardiovascular:     Rate and Rhythm: Normal rate.  Pulmonary:     Effort: Pulmonary effort is normal.  Musculoskeletal:        General: Swelling and tenderness present. Normal range of motion.       Arms:     Comments: No  crepitus, mass, bruising or rash.  Neurovascularly intact.  Full active ROM, though worse with shoulder shrugging against resistance.  Skin:    Coloration: Skin is not jaundiced or pale.  Neurological:     Mental Status: She is alert and oriented to person, place, and time.      UC Treatments / Results  Labs (all labs ordered are listed, but only abnormal results are displayed) Labs Reviewed - No data to display  EKG   Radiology No results found.  Procedures Procedures (including critical care time)  Medications Ordered in UC Medications - No data to display  Initial Impression / Assessment and Plan / UC Course  I have reviewed the triage vital signs and the nursing notes.  Pertinent labs & imaging results that were available during my care of the patient were reviewed by me and considered in my medical decision making (see chart for details).     H&P consistent with right trapezius strain.  Provided work note, supportive care as below, as well as sports medicine follow-up information.  Return precautions discussed, pt verbalized understanding and is agreeable to plan. Final Clinical Impressions(s) / UC Diagnoses   Final diagnoses:  Trapezius strain, right, initial encounter     Discharge Instructions     RICE: rest, ice, compression, elevation as needed for pain.    Pain medication:  500 mg Naprosyn/Aleve (naproxen) every 12 hours with food:  AVOID other NSAIDs while taking this (may have Tylenol).  May take muscle relaxer as needed for severe pain / spasm.  (This medication may cause you to become tired so it is important you do not drink alcohol or operate heavy machinery while on this medication.  Recommend your first dose to be taken before bedtime to monitor for side effects safely)  Important to follow up with specialist(s) below for further evaluation/management if your symptoms persist or worsen.    ED Prescriptions    Medication Sig Dispense Auth.  Provider   naproxen (NAPROSYN) 500 MG tablet Take 1 tablet (500 mg total) by mouth 2 (two) times daily. 30 tablet Hall-Potvin, [place], PA-C   cyclobenzaprine (FLEXERIL) 5 MG tablet Take 1 tablet (5 mg total) by mouth 2 (two) times daily as needed for up to 7 days for muscle spasms.  14 tablet Hall-Potvin, [place], PA-C     I have reviewed the PDMP during this encounter.   Hall-Potvin, [place], [place] 12/31/19 1201

## 2019-12-31 NOTE — Discharge Instructions (Addendum)
RICE: rest, ice, compression, elevation as needed for pain.    Pain medication:  500 mg Naprosyn/Aleve (naproxen) every 12 hours with food:  AVOID other NSAIDs while taking this (may have Tylenol).  May take muscle relaxer as needed for severe pain / spasm.  (This medication may cause you to become tired so it is important you do not drink alcohol or operate heavy machinery while on this medication.  Recommend your first dose to be taken before bedtime to monitor for side effects safely)  Important to follow up with specialist(s) below for further evaluation/management if your symptoms persist or worsen. 

## 2020-04-22 ENCOUNTER — Ambulatory Visit
Admission: EM | Admit: 2020-04-22 | Discharge: 2020-04-22 | Disposition: A | Discharge status: Home / Self Care | Payer: BC Managed Care – PPO | Attending: Family Medicine | Admitting: Family Medicine

## 2020-04-22 ENCOUNTER — Other Ambulatory Visit: Payer: Self-pay

## 2020-04-22 DIAGNOSIS — J019 Acute sinusitis, unspecified: Secondary | ICD-10-CM

## 2020-04-22 MED ORDER — AMOXICILLIN 875 MG PO TABS: 875.0000 mg | ORAL_TABLET | Freq: Two times a day (BID) | ORAL | 0 refills | Status: DC

## 2020-04-22 MED ORDER — TIZANIDINE HCL 4 MG PO TABS: 4.0000 mg | ORAL_TABLET | Freq: Four times a day (QID) | ORAL | 0 refills | Status: DC | PRN

## 2020-04-22 MED ORDER — CETIRIZINE HCL 10 MG PO CAPS: 10.0000 mg | ORAL_CAPSULE | Freq: Every day | ORAL | 0 refills | Status: DC

## 2020-04-22 NOTE — ED Provider Notes (Signed)
[name]    CSN: [id number] Arrival date & time: 04/22/20  1300      History   Chief Complaint Chief Complaint  Patient presents with  . Headache    HPI Amy SUMMERSON is a 40 y.o. female.   HPI Patient presents for evaluation of intermittent headache > one week which seemed to worsened today. Associated symptoms include sweaty, shaking, fatigue. She endorses chronic nasal congestion and pressure. Prescribed antihistamines and nasal spray in past which helped with symptoms however medication ran out. BP elevated today, no history of hypertension. She has taken Aleve for pain without relief of headache.afebrile no known sick contacts. Past Medical History:  Diagnosis Date  . Asthma     There are no problems to display for this patient.   Past Surgical History:  Procedure Laterality Date  . tubes tied      OB History   No obstetric history on file.      Home Medications    Prior to Admission medications   Medication Sig Start Date End Date Taking? Authorizing Provider  albuterol (VENTOLIN HFA) 108 (90 Base) MCG/ACT inhaler Inhale 2 puffs into the lungs every 6 (six) hours as needed for wheezing or shortness of breath. 10/20/19   Mauro Kaufmann, NP  Cetirizine HCl 10 MG CAPS Take 1 capsule (10 mg total) by mouth daily for 10 days. 10/16/19 10/26/19  Wieters, Hallie C, PA-C  naproxen (NAPROSYN) 500 MG tablet Take 1 tablet (500 mg total) by mouth 2 (two) times daily. 12/31/19   Hall-Potvin, [place], PA-C  olopatadine (PATANOL) 0.1 % ophthalmic solution Place 1 drop into both eyes 2 (two) times daily. 10/16/19   Wieters, Junius Creamer, PA-C    Family History Family History  Problem Relation Age of Onset  . Diabetes Mother   . Diabetes Father     Social History Social History   Tobacco Use  . Smoking status: Never Smoker  . Smokeless tobacco: Never Used  Vaping Use  . Vaping Use: Never used  Substance Use Topics  . Alcohol use: Yes  . Drug use: Not  Currently    Types: Marijuana     Allergies   Patient has no known allergies.   Review of Systems Review of Systems Pertinent negatives listed in HPI Physical Exam Triage Vital Signs ED Triage Vitals  Enc Vitals Group     BP 04/22/20 1335 (!) 152/95     Pulse Rate 04/22/20 1335 69     Resp 04/22/20 1335 19     Temp 04/22/20 1335 98.8 F (37.1 C)     Temp src --      SpO2 04/22/20 1335 99 %     Weight --      Height --      Head Circumference --      Peak Flow --      Pain Score 04/22/20 1334 0     Pain Loc --      Pain Edu? --      Excl. in GC? --    No data found.  Updated Vital Signs BP (!) 152/95   Pulse 69   Temp 98.8 F (37.1 C)   Resp 19   LMP 03/25/2020   SpO2 99%   Visual Acuity Right Eye Distance:   Left Eye Distance:   Bilateral Distance:    Right Eye Near:   Left Eye Near:    Bilateral Near:     Physical Exam  General  Appearance:    Alert, acutely ill appear, cooperative, no distress  HENT:   Normocephalic, ears normal, nares mucosal edema with congestion present, rhinorrhea, oropharynx clear w/o exudate    Eyes:    PERRL, conjunctiva/corneas clear, EOM's intact       Lungs:     Clear to auscultation bilaterally, respirations unlabored  Heart:    Regular rate and rhythm  Neurologic:   Awake, alert, oriented x 3. No obvious focal neurological           defect.      UC Treatments / Results  Labs (all labs ordered are listed, but only abnormal results are displayed) Labs Reviewed - No data to display  EKG   Radiology No results found.  Procedures Procedures (including critical care time)  Medications Ordered in UC Medications - No data to display  Initial Impression / Assessment and Plan / UC Course  I have reviewed the triage vital signs and the nursing notes.  Pertinent labs & imaging results that were available during my care of the patient were reviewed by me and considered in my medical decision making (see chart for  details).     Patient with a known history of recurrent headaches presents today with 1 week of a headache, facial pressure, recurrent nasal symptoms.  On exam patient has significant nasal congestion and erythema and mucosal edema.  Patient is not chronically prescribed allergy regimen.  Treating today for an acute sinusitis with amoxicillin 875 twice daily x10 days.  For headache related symptom of sinusitis prescribing cyclobenzaprine.  To relieve mucosal inflammation cetirizine 10 mg at bedtime.  Encouraged to take cetirizine daily to control rhinitis and congestion symptoms. ER precautions given.  Patient verbalized understanding and agreed with plan.  Work note provided.  Final Clinical Impressions(s) / UC Diagnoses   Final diagnoses:  Acute non-recurrent sinusitis, unspecified location   Discharge Instructions   None    ED Prescriptions    Medication Sig Dispense Auth. Provider   amoxicillin (AMOXIL) 875 MG tablet  (Status: Discontinued) Take 1 tablet (875 mg total) by mouth 2 (two) times daily. 20 tablet Bing Neighbors, FNP   Cetirizine HCl 10 MG CAPS  (Status: Discontinued) Take 1 capsule (10 mg total) by mouth daily. 30 capsule Bing Neighbors, FNP   tiZANidine (ZANAFLEX) 4 MG tablet  (Status: Discontinued) Take 1 tablet (4 mg total) by mouth every 6 (six) hours as needed for muscle spasms. 30 tablet Bing Neighbors, FNP   amoxicillin (AMOXIL) 875 MG tablet Take 1 tablet (875 mg total) by mouth 2 (two) times daily. 20 tablet Bing Neighbors, FNP   tiZANidine (ZANAFLEX) 4 MG tablet Take 1 tablet (4 mg total) by mouth every 6 (six) hours as needed for muscle spasms. 30 tablet Bing Neighbors, FNP   Cetirizine HCl 10 MG CAPS Take 1 capsule (10 mg total) by mouth daily. 30 capsule Bing Neighbors, FNP     PDMP not reviewed this encounter.   Bing Neighbors, [place] 04/26/20 [phone]

## 2020-04-22 NOTE — ED Triage Notes (Signed)
Pt presents with complaints of an off and on headache, feeling sweaty, and feeling shaky. Reports this started today. She has eaten with no improvement. Pt also endorses feeling fatigued. Pt denies hx of same.

## 2020-04-23 NOTE — ED Notes (Addendum)
Pt needed work note extended

## 2020-05-07 ENCOUNTER — Ambulatory Visit: Payer: BC Managed Care – PPO | Admitting: Family

## 2020-05-07 ENCOUNTER — Other Ambulatory Visit: Payer: Self-pay

## 2020-05-07 ENCOUNTER — Encounter: Payer: Self-pay | Admitting: Family

## 2020-05-07 VITALS — BP 147/90 | HR 70 | Ht 61.34 in | Wt 157.4 lb

## 2020-05-07 DIAGNOSIS — J4520 Mild intermittent asthma, uncomplicated: Secondary | ICD-10-CM | POA: Diagnosis not present

## 2020-05-07 DIAGNOSIS — F32A Depression, unspecified: Secondary | ICD-10-CM

## 2020-05-07 DIAGNOSIS — Z7689 Persons encountering health services in other specified circumstances: Secondary | ICD-10-CM | POA: Diagnosis not present

## 2020-05-07 DIAGNOSIS — J329 Chronic sinusitis, unspecified: Secondary | ICD-10-CM | POA: Diagnosis not present

## 2020-05-07 DIAGNOSIS — F419 Anxiety disorder, unspecified: Secondary | ICD-10-CM

## 2020-05-07 DIAGNOSIS — I10 Essential (primary) hypertension: Secondary | ICD-10-CM

## 2020-05-07 MED ORDER — ALBUTEROL SULFATE HFA 108 (90 BASE) MCG/ACT IN AERS: 2.0000 | INHALATION_SPRAY | Freq: Four times a day (QID) | RESPIRATORY_TRACT | 1 refills | Status: DC | PRN

## 2020-05-07 MED ORDER — SERTRALINE HCL 25 MG PO TABS: 25.0000 mg | ORAL_TABLET | Freq: Every day | ORAL | 0 refills | Status: DC

## 2020-05-07 MED ORDER — AEROCHAMBER PLUS MISC: 2 refills | Status: AC

## 2020-05-07 MED ORDER — LISINOPRIL 5 MG PO TABS: 5.0000 mg | ORAL_TABLET | Freq: Every day | ORAL | 1 refills | Status: DC

## 2020-05-07 MED ORDER — ALBUTEROL SULFATE (2.5 MG/3ML) 0.083% IN NEBU: 2.5000 mg | INHALATION_SOLUTION | Freq: Four times a day (QID) | RESPIRATORY_TRACT | 1 refills | Status: DC | PRN

## 2020-05-07 NOTE — Progress Notes (Signed)
Establish care Elevated BP readings,

## 2020-05-07 NOTE — Patient Instructions (Signed)
Return for annual physical examination, labs, and health maintenance. Arrive fasting meaning having had no food and/or nothing to drink for at least 8 hours prior to appointment.  Please take scheduled medications as normal.  Begin Lisinopril for high blood pressure. Write down date and blood pressure reading. Blood pressure goal 130/80 or less. Follow-up for blood pressure check in 4 weeks or sooner if needed.  Continue Albuterol for asthma adding Albuterol nebulizer for home use. Thank you for choosing Primary Care at [facility] for your medical home!    Amy Guerrero was seen by Amy Fendt, NP today.   Amy Guerrero's primary care provider is Amy Fendt, NP.   For the best care possible,  you should try to see Amy Stabs, NP whenever you come to clinic.   We look forward to seeing you again soon!  If you have any questions about your visit today,  please call us at [phone]  Or feel free to reach your provider via MyChart.     Hypertension, Adult Hypertension is another name for high blood pressure. High blood pressure forces your heart to work harder to pump blood. This can cause problems over time. There are two numbers in a blood pressure reading. There is a top number (systolic) over a bottom number (diastolic). It is best to have a blood pressure that is below 120/80. Healthy choices can help lower your blood pressure, or you may need medicine to help lower it. What are the causes? The cause of this condition is not known. Some conditions may be related to high blood pressure. What increases the risk?  Smoking.  Having type 2 diabetes mellitus, high cholesterol, or both.  Not getting enough exercise or physical activity.  Being overweight.  Having too much fat, sugar, calories, or salt (sodium) in your diet.  Drinking too much alcohol.  Having long-term (chronic) kidney disease.  Having a family history of high blood pressure.  Age. Risk  increases with age.  Race. You may be at higher risk if you are African American.  Gender. Men are at higher risk than women before age 73. After age 36, women are at higher risk than men.  Having obstructive sleep apnea.  Stress. What are the signs or symptoms?  High blood pressure may not cause symptoms. Very high blood pressure (hypertensive crisis) may cause: ? Headache. ? Feelings of worry or nervousness (anxiety). ? Shortness of breath. ? Nosebleed. ? A feeling of being sick to your stomach (nausea). ? Throwing up (vomiting). ? Changes in how you see. ? Very bad chest pain. ? Seizures. How is this treated?  This condition is treated by making healthy lifestyle changes, such as: ? Eating healthy foods. ? Exercising more. ? Drinking less alcohol.  Your health care provider may prescribe medicine if lifestyle changes are not enough to get your blood pressure under control, and if: ? Your top number is above 130. ? Your bottom number is above 80.  Your personal target blood pressure may vary. Follow these instructions at home: Eating and drinking  If told, follow the DASH eating plan. To follow this plan: ? Fill one half of your plate at each meal with fruits and vegetables. ? Fill one fourth of your plate at each meal with whole grains. Whole grains include whole-wheat pasta, brown rice, and whole-grain bread. ? Eat or drink low-fat dairy products, such as skim milk or low-fat yogurt. ? Fill one fourth of your  plate at each meal with low-fat (lean) proteins. Low-fat proteins include fish, chicken without skin, eggs, beans, and tofu. ? Avoid fatty meat, cured and processed meat, or chicken with skin. ? Avoid pre-made or processed food.  Eat less than 1,500 mg of salt each day.  Do not drink alcohol if: ? Your doctor tells you not to drink. ? You are pregnant, may be pregnant, or are planning to become pregnant.  If you drink alcohol: ? Limit how much you use  to:  0-1 drink a day for women.  0-2 drinks a day for men. ? Be aware of how much alcohol is in your drink. In the [place] one drink equals one 12 oz bottle of beer (355 mL), one 5 oz glass of wine (148 mL), or one 1 oz glass of hard liquor (44 mL).   Lifestyle  Work with your doctor to stay at a healthy weight or to lose weight. Ask your doctor what the best weight is for you.  Get at least 30 minutes of exercise most days of the week. This may include walking, swimming, or biking.  Get at least 30 minutes of exercise that strengthens your muscles (resistance exercise) at least 3 days a week. This may include lifting weights or doing Pilates.  Do not use any products that contain nicotine or tobacco, such as cigarettes, e-cigarettes, and chewing tobacco. If you need help quitting, ask your doctor.  Check your blood pressure at home as told by your doctor.  Keep all follow-up visits as told by your doctor. This is important.   Medicines  Take over-the-counter and prescription medicines only as told by your doctor. Follow directions carefully.  Do not skip doses of blood pressure medicine. The medicine does not work as well if you skip doses. Skipping doses also puts you at risk for problems.  Ask your doctor about side effects or reactions to medicines that you should watch for. Contact a doctor if you:  Think you are having a reaction to the medicine you are taking.  Have headaches that keep coming back (recurring).  Feel dizzy.  Have swelling in your ankles.  Have trouble with your vision. Get help right away if you:  Get a very bad headache.  Start to feel mixed up (confused).  Feel weak or numb.  Feel faint.  Have very bad pain in your: ? Chest. ? Belly (abdomen).  Throw up more than once.  Have trouble breathing. Summary  Hypertension is another name for high blood pressure.  High blood pressure forces your heart to work harder to pump blood.  For most  people, a normal blood pressure is less than 120/80.  Making healthy choices can help lower blood pressure. If your blood pressure does not get lower with healthy choices, you may need to take medicine. This information is not intended to replace advice given to you by your health care provider. Make sure you discuss any questions you have with your health care provider. Document Revised: 10/13/2017 Document Reviewed: 10/13/2017 Elsevier Patient Education  2021 [employer].

## 2020-05-07 NOTE — Progress Notes (Signed)
Subjective:    Amy Guerrero - 40 y.o. female MRN [id number]  Date of birth: 03-May-1980  HPI  Amy Guerrero is to establish care. Patient has a PMH significant for elevated blood pressure readings, anxiety, and depression. She is accompanied by her female friend.  Current issues and/or concerns: 1. URGENT CARE FOLLOW-UP: Visit 04/22/2020 at [facility] at [facility] per NP note: Patient with a known history of recurrent headaches presents today with 1 week of a headache, facial pressure, recurrent nasal symptoms. On exam patient has significant nasal congestion and erythema and mucosal edema.  Patient is not chronically prescribed allergy regimen. Treating today for an acute sinusitis with amoxicillin 875 twice daily x10 days. For headache related symptom of sinusitis prescribing cyclobenzaprine. To relieve mucosal inflammation cetirizine 10 mg at bedtime.  Encouraged to take cetirizine daily to control rhinitis and congestion symptoms. ER precautions given.  Patient verbalized understanding and agreed with plan.  Work note provided.  05/07/2020 Urgent Care Follow-Up: Time since discharge: 15 days Diagnosis: acute non-recurrent sinusitis Procedures/tests: None Consultants: None New medications: Amoxicillin, Cyclobenzaprine, Cetirizine Status: Reports feeling the same since discharge. Reports having chronic sinusitis and nothing seems to help. Still experiencing headache, nasal congestion, eyes watering, facial pressure, feeling clogged up, difficulty sleeping, and difficult to breathe sometimes. Also, taking over the counter Benadryl. Heat makes symptoms worse.  2. BLOOD PRESSURE CONCERN: Reports elevated blood pressure reading at recent Urgent Care visit and concern that blood pressure reading elevated today. Also had elevated blood pressure readings prior to this. Plans to purchase home blood pressure monitor soon. Would like to begin blood pressure medication. Adherence  with salt restriction (low-salt diet): [x]  Yes    []  No Exercise: While at work Home Monitoring?: []  Yes   [x]  No Monitoring Frequency: []  Yes    [x]  No Home BP results range: []  Yes    [x]  No Smoking: [x]  Yes, marijuana SOB? [x]  Yes    []  No Chest Pain?: []  Yes    [x]  No, but does have chest discomfort sometimes Leg swelling?: []  Yes    [x]  No Headaches?: [x]  Yes    []  No Dizziness? []  Yes    [x]  No  3. ASTHMA: Using Albuterol inhaler once per week or sometimes once every two weeks. Does not feel like the inhaler medication is getting to her lungs when she does have to use inhaler.  Asthma status: stable Dyspnea frequency: sometimes at work  Wheezing frequency: sometimes  Cough frequency: yes  Nocturnal symptom frequency: no Limitation of activity: no Triggers: cologne, certain chemicals Aerochamber/spacer use: no  4. ANXIETY AND DEPRESSION: Reports primarily related to past life experiences along with work-life balance. Reports began in childhood and received counseling during childhood. Reports as a child when counselors would begin to ask personal questions she would shutdown and finds that she still does this today as an adult. Declines referral to Social Work and Psychiatry. Would like to begin anxiety and depression medication. Denies thoughts of self-harm, suicidal ideations, and homicidal ideations.   Depression screen [facility] 2/9 05/07/2020  Decreased Interest 2  Down, Depressed, Hopeless 3  PHQ - 2 Score 5  Altered sleeping 3  Tired, decreased energy 1  Change in appetite 3  Feeling bad or failure about yourself  3  Trouble concentrating 1  Moving slowly or fidgety/restless 0  Suicidal thoughts 2  PHQ-9 Score 18  Difficult doing work/chores Somewhat difficult    ROS per HPI  Health Maintenance:  Health Maintenance Due  Topic Date Due  . Hepatitis C Screening  Never done  . HIV Screening  Never done  . PAP SMEAR-Modifier  Never done   Past Medical  History: Patient Active Problem List   Diagnosis Date Noted  . Essential hypertension 05/07/2020  . Anxiety and depression 05/07/2020   Social History   reports that she has never smoked. She has never used smokeless tobacco. She reports current alcohol use of about 1.0 standard drink of alcohol per week. She reports current drug use. Drug: Marijuana.   Family History  family history includes Diabetes in her father and mother.   Medications: reviewed and updated   Objective:   Physical Exam BP (!) 147/90 (BP Location: Left Arm, Patient Position: Sitting)   Pulse 70   Ht 5' 1.34" (1.558 m)   Wt 157 lb 6.4 oz (71.4 kg)   SpO2 98%   BMI 29.41 kg/m  Physical Exam HENT:     Head: Normocephalic and atraumatic.  Eyes:     Extraocular Movements: Extraocular movements intact.     Conjunctiva/sclera: Conjunctivae normal.     Pupils: Pupils are equal, round, and reactive to light.  Cardiovascular:     Rate and Rhythm: Normal rate and regular rhythm.     Pulses: Normal pulses.     Heart sounds: Normal heart sounds.  Pulmonary:     Effort: Pulmonary effort is normal.     Breath sounds: Normal breath sounds.  Musculoskeletal:     Cervical back: Normal range of motion and neck supple.  Neurological:     General: No focal deficit present.     Mental Status: She is alert and oriented to person, place, and time.  Psychiatric:        Mood and Affect: Mood normal.        Behavior: Behavior normal.      Assessment & Plan:  1. Encounter to establish care: - Patient presents today to establish care.  - Return for annual physical examination, labs, and health maintenance. Arrive fasting meaning having had no food and/or nothing to drink for at least 8 hours prior to appointment.  Please take scheduled medications as normal.  2. Chronic sinusitis, unspecified location: - Patient reports chronic sinusitis.  - Continue Cetirizine as prescribed. - Follow-up with primary provider as  scheduled.  3. Essential hypertension: - Blood pressure not at goal during today's visit. Patient asymptomatic without chest pressure, chest pain, palpitations, shortness of breath, and worst headache of life. - Begin Lisinopril as prescribed.  - Counseled on blood pressure goal of less than 130/80, low-sodium, DASH diet, medication compliance, 150 minutes of moderate intensity exercise per week as tolerated. Discussed medication compliance, adverse effects. - Follow-up with primary provider within 4 weeks or sooner if needed.  - lisinopril (ZESTRIL) 5 MG tablet; Take 1 tablet (5 mg total) by mouth daily.  Dispense: 30 tablet; Refill: 1  4. Mild intermittent asthma without complication: - Stable. - Continue Albuterol inhaler as prescribed. Adding spacer to assist with medication getting to the lungs more efficiently. - Begin Albuterol nebulizer solution for home use as prescribed.  - Follow-up with primary provider as scheduled.  - albuterol (VENTOLIN HFA) 108 (90 Base) MCG/ACT inhaler; Inhale 2 puffs into the lungs every 6 (six) hours as needed for wheezing or shortness of breath.  Dispense: 8 g; Refill: 1 - albuterol (PROVENTIL) (2.5 MG/3ML) 0.083% nebulizer solution; Take 3 mLs (2.5 mg total) by nebulization every  6 (six) hours as needed for wheezing or shortness of breath.  Dispense: 150 mL; Refill: 1  5. Anxiety and depression: - Patient reports began in childhood.  - Stable.  - Denies thoughts of self-harm, suicidal ideations, and homicidal ideations.  - Declined Social Work and Psychiatry counseling and therapy sessions.  - Begin Sertraline as prescribed. Follow-up with primary provider within 4 weeks or sooner if needed.   Avoid driving or hazardous activity until you know how this medication will affect you. Your reactions could be impaired. Dizziness or fainting can cause falls, accidents, or severe injuries.  Common side effects include drowsiness, nausea, constipation, loss of  appetite, dry mouth, increased sweating.  Call your provider if you have pounding heartbeats or fluttering in your chest, a light-headed feeling like you may pass out, easy bruising/unusal bleeding, vision change, difficult or painful urination, impotence/sexual problems, liver problems (right-sided upper stomach pain, itching, dark urine, yellowing of skin or eyes/jaundice, low levels of sodium in the body (headache, confusion, slurred speech, severe weakness, vomiting, loss of coordination, feeling unsteady), or manic episodes (racing thoughts, increased energy, decreased need for sleep, risk-taking behavior, being agitated, talkative)  Seek medical attention immediately if you have symptoms of serotonin syndrome such as agitation, hallucinations, fever, sweating, shivering, fast heart rate, muscle stiffness, twitching, loss of coordination, nausea, vomiting, or diarrhea  Report any new or worsening symptoms to your provider, such as but not limited to: mood or behavior changes, anxiety, panic attacks, trouble sleeping, or if you feel impulsive, irritable, agitated, hostile, aggressive, restless, hyperactive (mentally or physically), more depressed, or have thoughts about suicide or hurting yourself. - sertraline (ZOLOFT) 25 MG tablet; Take 1 tablet (25 mg total) by mouth at bedtime.  Dispense: 30 tablet; Refill: 0    Patient was given clear instructions to go to Emergency Department or return to medical center if symptoms don't improve, worsen, or new problems develop.The patient verbalized understanding.  I discussed the assessment and treatment plan with the patient. The patient was provided an opportunity to ask questions and all were answered. The patient agreed with the plan and demonstrated an understanding of the instructions.   The patient was advised to call back or seek an in-person evaluation if the symptoms worsen or if the condition fails to improve as anticipated.    Ricky Stabs,  NP 05/09/2020, 6:08 PM Primary Care at [facility]

## 2020-06-10 NOTE — Progress Notes (Signed)
Patient did not show for appointment.   

## 2020-06-11 ENCOUNTER — Encounter: Payer: BC Managed Care – PPO | Admitting: Family

## 2020-06-11 DIAGNOSIS — Z124 Encounter for screening for malignant neoplasm of cervix: Secondary | ICD-10-CM

## 2020-06-11 DIAGNOSIS — I10 Essential (primary) hypertension: Secondary | ICD-10-CM

## 2020-06-11 DIAGNOSIS — Z1159 Encounter for screening for other viral diseases: Secondary | ICD-10-CM

## 2020-06-11 DIAGNOSIS — Z0000 Encounter for general adult medical examination without abnormal findings: Secondary | ICD-10-CM

## 2020-06-11 DIAGNOSIS — Z131 Encounter for screening for diabetes mellitus: Secondary | ICD-10-CM

## 2020-06-11 DIAGNOSIS — F419 Anxiety disorder, unspecified: Secondary | ICD-10-CM

## 2020-06-11 DIAGNOSIS — Z114 Encounter for screening for human immunodeficiency virus [HIV]: Secondary | ICD-10-CM

## 2020-06-11 DIAGNOSIS — Z1329 Encounter for screening for other suspected endocrine disorder: Secondary | ICD-10-CM

## 2020-06-11 DIAGNOSIS — Z13220 Encounter for screening for lipoid disorders: Secondary | ICD-10-CM

## 2020-06-11 DIAGNOSIS — Z13228 Encounter for screening for other metabolic disorders: Secondary | ICD-10-CM

## 2020-06-11 DIAGNOSIS — Z130 Encounter for screening for diseases of the blood and blood-forming organs and certain disorders involving the immune mechanism: Secondary | ICD-10-CM

## 2020-06-18 ENCOUNTER — Ambulatory Visit
Admission: EM | Admit: 2020-06-18 | Discharge: 2020-06-18 | Disposition: A | Discharge status: Home / Self Care | Payer: BC Managed Care – PPO | Attending: Emergency Medicine | Admitting: Emergency Medicine

## 2020-06-18 ENCOUNTER — Telehealth: Payer: Self-pay

## 2020-06-18 ENCOUNTER — Other Ambulatory Visit: Payer: Self-pay

## 2020-06-18 DIAGNOSIS — R197 Diarrhea, unspecified: Secondary | ICD-10-CM

## 2020-06-18 DIAGNOSIS — R1084 Generalized abdominal pain: Secondary | ICD-10-CM | POA: Diagnosis not present

## 2020-06-18 MED ORDER — DICYCLOMINE HCL 20 MG PO TABS: 20.0000 mg | ORAL_TABLET | Freq: Three times a day (TID) | ORAL | 0 refills | Status: DC

## 2020-06-18 MED ORDER — FAMOTIDINE 40 MG PO TABS: 40.0000 mg | ORAL_TABLET | Freq: Every day | ORAL | 0 refills | Status: DC

## 2020-06-18 MED ORDER — DIPHENOXYLATE-ATROPINE 2.5-0.025 MG PO TABS: 1.0000 | ORAL_TABLET | Freq: Four times a day (QID) | ORAL | 0 refills | Status: DC | PRN

## 2020-06-18 MED ORDER — ONDANSETRON 4 MG PO TBDP: 4.0000 mg | ORAL_TABLET | Freq: Three times a day (TID) | ORAL | 0 refills | Status: DC | PRN

## 2020-06-18 NOTE — ED Triage Notes (Addendum)
Pt c/o center abdominal pain with vomiting and diarrhea off and on for the past month. States diarrhea today vomit x1. States has been seen here for same in the past.

## 2020-06-18 NOTE — Discharge Instructions (Signed)
Zofran as needed for nausea/vomiting Pepcid daily to help with any underlying indigestion/acid Bentyl before meals and bedtime to help with cramping Lomotil as needed for diarrhea/loose stools Please return stool sample Drink plenty of fluids Follow-up with gastroenterology if symptoms persisting Go to emergency room if symptoms worsening, developing increased abdominal pain

## 2020-06-18 NOTE — ED Provider Notes (Signed)
[name]    CSN: [id number] Arrival date & time: 06/18/20  1217      History   Chief Complaint Chief Complaint  Patient presents with  . Abdominal Pain    HPI ANAIRA SEAY is a 40 y.o. female history of hypertension presenting today for evaluation of abdominal pain.  Patient reports that over the past month she has had loose frequent bowel movements after anytime she eats.  Reports yellow watery diarrhea after ingestion of small amounts of food.  Today she had an episode of nausea and vomiting which is different from normal.  In general she reports similar symptoms have been going on for over a year off and on.  She denies any recent travel.  Occasionally will have some abdominal discomfort associated with this.  Denies blood in stool or vomit.  Denies nausea or vomiting.  Feels gassy at times.  Was recently on round of amoxicillin early in March, but reports that symptoms have been going on since before then.  HPI  Past Medical History:  Diagnosis Date  . Asthma   . Sinusitis     Patient Active Problem List   Diagnosis Date Noted  . Essential hypertension 05/07/2020  . Anxiety and depression 05/07/2020    Past Surgical History:  Procedure Laterality Date  . CARPAL TUNNEL RELEASE Right   . TUBAL LIGATION    . tubes tied      OB History   No obstetric history on file.      Home Medications    Prior to Admission medications   Medication Sig Start Date End Date Taking? Authorizing Provider  dicyclomine (BENTYL) 20 MG tablet Take 1 tablet (20 mg total) by mouth 4 (four) times daily -  before meals and at bedtime. 06/18/20  Yes Tirrell Buchberger C, PA-C  diphenoxylate-atropine (LOMOTIL) 2.5-0.025 MG tablet Take 1 tablet by mouth 4 (four) times daily as needed for diarrhea or loose stools. 06/18/20  Yes Aaidyn San C, PA-C  famotidine (PEPCID) 40 MG tablet Take 1 tablet (40 mg total) by mouth daily. 06/18/20  Yes Krishika Bugge C, PA-C  ondansetron  (ZOFRAN ODT) 4 MG disintegrating tablet Take 1 tablet (4 mg total) by mouth every 8 (eight) hours as needed for nausea or vomiting. 06/18/20  Yes Esperanza Madrazo C, PA-C  albuterol (PROVENTIL) (2.5 MG/3ML) 0.083% nebulizer solution Take 3 mLs (2.5 mg total) by nebulization every 6 (six) hours as needed for wheezing or shortness of breath. 05/07/20   Rema Fendt, NP  albuterol (VENTOLIN HFA) 108 (90 Base) MCG/ACT inhaler Inhale 2 puffs into the lungs every 6 (six) hours as needed for wheezing or shortness of breath. 05/07/20   Rema Fendt, NP  Cetirizine HCl 10 MG CAPS Take 1 capsule (10 mg total) by mouth daily. 04/22/20 05/22/20  Bing Neighbors, FNP  lisinopril (ZESTRIL) 5 MG tablet Take 1 tablet (5 mg total) by mouth daily. 05/07/20   Rema Fendt, NP  sertraline (ZOLOFT) 25 MG tablet Take 1 tablet (25 mg total) by mouth at bedtime. 05/07/20   Rema Fendt, NP  Spacer/Aero-Holding Chambers (AEROCHAMBER PLUS) inhaler Use as instructed 05/07/20   Rema Fendt, NP  tiZANidine (ZANAFLEX) 4 MG tablet Take 1 tablet (4 mg total) by mouth every 6 (six) hours as needed for muscle spasms. 04/22/20   Bing Neighbors, FNP    Family History Family History  Problem Relation Age of Onset  . Diabetes Mother   .  Diabetes Father     Social History Social History   Tobacco Use  . Smoking status: Never Smoker  . Smokeless tobacco: Never Used  Vaping Use  . Vaping Use: Every day  Substance Use Topics  . Alcohol use: Yes    Alcohol/week: 1.0 standard drink    Types: 1 Glasses of wine per week    Comment: occassionally  . Drug use: Yes    Types: Marijuana     Allergies   Patient has no known allergies.   Review of Systems Review of Systems  Constitutional: Negative for fatigue and fever.  Eyes: Negative for visual disturbance.  Respiratory: Negative for shortness of breath.   Cardiovascular: Negative for chest pain.  Gastrointestinal: Positive for abdominal pain, diarrhea, nausea  and vomiting.  Musculoskeletal: Negative for arthralgias and joint swelling.  Skin: Negative for color change, rash and wound.  Neurological: Negative for dizziness, weakness, light-headedness and headaches.     Physical Exam Triage Vital Signs ED Triage Vitals  Enc Vitals Group     BP 06/18/20 1409 (!) 154/96     Pulse Rate 06/18/20 1409 85     Resp 06/18/20 1409 18     Temp 06/18/20 1409 98.4 F (36.9 C)     Temp Source 06/18/20 1409 Oral     SpO2 06/18/20 1409 97 %     Weight --      Height --      Head Circumference --      Peak Flow --      Pain Score 06/18/20 1410 7     Pain Loc --      Pain Edu? --      Excl. in GC? --    No data found.  Updated Vital Signs BP (!) 154/96 (BP Location: Left Arm)   Pulse 85   Temp 98.4 F (36.9 C) (Oral)   Resp 18   LMP 05/23/2020   SpO2 97%   Visual Acuity Right Eye Distance:   Left Eye Distance:   Bilateral Distance:    Right Eye Near:   Left Eye Near:    Bilateral Near:     Physical Exam Vitals and nursing note reviewed.  Constitutional:      Appearance: She is well-developed.     Comments: No acute distress  HENT:     Head: Normocephalic and atraumatic.     Nose: Nose normal.  Eyes:     Conjunctiva/sclera: Conjunctivae normal.  Cardiovascular:     Rate and Rhythm: Normal rate.  Pulmonary:     Effort: Pulmonary effort is normal. No respiratory distress.     Comments: Breathing comfortably at rest, CTABL, no wheezing, rales or other adventitious sounds auscultated Abdominal:     General: There is no distension.     Comments: Soft, nondistended, generalized tenderness throughout entire abdomen, no focal tenderness, negative rebound, negative Rovsing, negative McBurney's  Musculoskeletal:        General: Normal range of motion.     Cervical back: Neck supple.  Skin:    General: Skin is warm and dry.  Neurological:     Mental Status: She is alert and oriented to person, place, and time.      UC  Treatments / Results  Labs (all labs ordered are listed, but only abnormal results are displayed) Labs Reviewed - No data to display  EKG   Radiology No results found.  Procedures Procedures (including critical care time)  Medications Ordered in UC Medications -  No data to display  Initial Impression / Assessment and Plan / UC Course  I have reviewed the triage vital signs and the nursing notes.  Pertinent labs & imaging results that were available during my care of the patient were reviewed by me and considered in my medical decision making (see chart for details).     Persistent diarrhea-recommended stool sample to send off for GI pathogen panel, given symptoms have been intermittent over approximately 1 year, but more persistent recently we will send referral to gastroenterology for further evaluation as well.  Treating symptomatically and supportively with Zofran, Bentyl, Lomotil and Pepcid as needed for any underlying indigestion contributing to increased nausea this morning.  Discussed strict return precautions. Patient verbalized understanding and is agreeable with plan.  Final Clinical Impressions(s) / UC Diagnoses   Final diagnoses:  Generalized abdominal pain  Diarrhea, unspecified type     Discharge Instructions     Zofran as needed for nausea/vomiting Pepcid daily to help with any underlying indigestion/acid Bentyl before meals and bedtime to help with cramping Lomotil as needed for diarrhea/loose stools Please return stool sample Drink plenty of fluids Follow-up with gastroenterology if symptoms persisting Go to emergency room if symptoms worsening, developing increased abdominal pain    ED Prescriptions    Medication Sig Dispense Auth. Provider   ondansetron (ZOFRAN ODT) 4 MG disintegrating tablet Take 1 tablet (4 mg total) by mouth every 8 (eight) hours as needed for nausea or vomiting. 20 tablet Avree Szczygiel C, PA-C   dicyclomine (BENTYL) 20 MG  tablet Take 1 tablet (20 mg total) by mouth 4 (four) times daily -  before meals and at bedtime. 20 tablet Ridley Dileo C, PA-C   famotidine (PEPCID) 40 MG tablet Take 1 tablet (40 mg total) by mouth daily. 20 tablet Juandavid Dallman C, PA-C   diphenoxylate-atropine (LOMOTIL) 2.5-0.025 MG tablet Take 1 tablet by mouth 4 (four) times daily as needed for diarrhea or loose stools. 30 tablet Nico Syme, [place] C, PA-C     PDMP not reviewed this encounter.   Lew Dawes, [place] 06/18/20 [phone]

## 2020-07-11 LAB — GI PROFILE, STOOL, PCR

## 2020-07-11 LAB — CLOSTRIDIUM DIFFICILE EIA

## 2020-07-16 NOTE — Progress Notes (Signed)
Patient ID: Amy Guerrero, female    DOB: [birth year]  MRN: [id number]  CC: Annual Physical Exam  Subjective: Amy Guerrero is a 40 y.o. female who presents for annual physical exam. She is accompanied by her female friend.   Her concerns today include:   1. HYPERTENSION FOLLOW-UP: 05/07/2020: - Begin Lisinopril as prescribed. - Follow-up with primary provider within 4 weeks or sooner if needed.   07/17/2020: Reports since last visit she never began taking Lisinopril.   2. ASTHMA FOLLOW-UP: 05/07/2020: - Stable. - Continue Albuterol inhaler as prescribed. Adding spacer to assist with medication getting to the lungs more efficiently. - Begin Albuterol nebulizer solution for home use as prescribed.  - Follow-up with primary provider as scheduled.   07/17/2020: Doing well on current regimen. Requesting refills on Albuterol and nebulizer.   3. HEMORRHOIDS: Noticed 2 weeks ago. History of the same during pregnancy. Described as feeling like something is hanging outside of rectum. Feels irritated but not hurting. Does itch. Denies straining during bowel movements. Denies blood in the stool or darker than normal stools.   Duration: weeks Anal fullness: no Perianal itching/irritation: yes Perianal pain: no Bright red rectal bleeding: no Constipation: yes Chronic straining/valsava: no Treatments attempted: nothing  Previous hemorrhoids: yes   4. LACERATION OF RIGHT LOWER LEG: Visit 05/02/2019 at [facility] per PA note: Discussed case with Dr. Leonides Grills.  Laceration site is clean and dry, sutures intact, no signs of cellulitis/drainage.  Patient's leg is cool to touch distal of laceration site, though with good pulses. ?  Tight dressing causing symptoms.  Discussed shooting pain could be due to nerve involvement from laceration or inflammation.  At this time, will treat symptomatically with Toradol injection, diclofenac, ice compress.  Will provide CAM  Walker for further symptomatic relief.  Patient to follow-up with sports medicine/orthopedics for further evaluation of foot numbness/cold extremity.  Return precautions given.  Patient expresses understanding and agrees to plan.  07/17/2020: Concern for ongoing pain at the same site even with light touch. She would like referral to see if there is something more going on.  Patient Active Problem List   Diagnosis Date Noted  . Essential hypertension 05/07/2020  . Anxiety and depression 05/07/2020     Current Outpatient Medications on File Prior to Visit  Medication Sig Dispense Refill  . Cetirizine HCl 10 MG CAPS Take 1 capsule (10 mg total) by mouth daily. 30 capsule 0  . dicyclomine (BENTYL) 20 MG tablet Take 1 tablet (20 mg total) by mouth 4 (four) times daily -  before meals and at bedtime. (Patient not taking: Reported on 07/17/2020) 20 tablet 0  . diphenoxylate-atropine (LOMOTIL) 2.5-0.025 MG tablet Take 1 tablet by mouth 4 (four) times daily as needed for diarrhea or loose stools. (Patient not taking: Reported on 07/17/2020) 30 tablet 0  . famotidine (PEPCID) 40 MG tablet Take 1 tablet (40 mg total) by mouth daily. (Patient not taking: Reported on 07/17/2020) 20 tablet 0  . lisinopril (ZESTRIL) 5 MG tablet Take 1 tablet (5 mg total) by mouth daily. (Patient not taking: Reported on 07/17/2020) 30 tablet 1  . ondansetron (ZOFRAN ODT) 4 MG disintegrating tablet Take 1 tablet (4 mg total) by mouth every 8 (eight) hours as needed for nausea or vomiting. 20 tablet 0  . sertraline (ZOLOFT) 25 MG tablet Take 1 tablet (25 mg total) by mouth at bedtime. (Patient not taking: Reported on 07/17/2020) 30 tablet 0  .  Spacer/Aero-Holding Chambers (AEROCHAMBER PLUS) inhaler Use as instructed 1 each 2  . tiZANidine (ZANAFLEX) 4 MG tablet Take 1 tablet (4 mg total) by mouth every 6 (six) hours as needed for muscle spasms. (Patient not taking: Reported on 07/17/2020) 30 tablet 0   No current facility-administered  medications on file prior to visit.    No Known Allergies  Social History   Socioeconomic History  . Marital status: Single    Spouse name: Not on file  . Number of children: Not on file  . Years of education: Not on file  . Highest education level: Not on file  Occupational History  . Not on file  Tobacco Use  . Smoking status: Never Smoker  . Smokeless tobacco: Never Used  Vaping Use  . Vaping Use: Every day  Substance and Sexual Activity  . Alcohol use: Yes    Alcohol/week: 1.0 standard drink    Types: 1 Glasses of wine per week    Comment: occassionally  . Drug use: Yes    Types: Marijuana  . Sexual activity: Not on file  Other Topics Concern  . Not on file  Social History Narrative  . Not on file   Social Determinants of Health   Financial Resource Strain: Not on file  Food Insecurity: Not on file  Transportation Needs: Not on file  Physical Activity: Not on file  Stress: Not on file  Social Connections: Not on file  Intimate Partner Violence: Not on file    Family History  Problem Relation Age of Onset  . Diabetes Mother   . Diabetes Father     Past Surgical History:  Procedure Laterality Date  . CARPAL TUNNEL RELEASE Right   . TUBAL LIGATION    . tubes tied      ROS: Review of Systems Negative except as stated above  PHYSICAL EXAM: BP 118/80 (BP Location: Left Arm, Patient Position: Sitting, Cuff Size: Normal)   Pulse 73   Temp 98.2 F (36.8 C)   Resp 15   Ht 5' 1.34" (1.558 m)   Wt 156 lb 12.8 oz (71.1 kg)   SpO2 97%   BMI 29.30 kg/m   Physical Exam HENT:     Head: Normocephalic.     Right Ear: Tympanic membrane, ear canal and external ear normal.     Left Ear: Tympanic membrane, ear canal and external ear normal.  Eyes:     Extraocular Movements: Extraocular movements intact.     Conjunctiva/sclera: Conjunctivae normal.     Pupils: Pupils are equal, round, and reactive to light.  Cardiovascular:     Rate and Rhythm: Normal  rate and regular rhythm.     Pulses: Normal pulses.     Heart sounds: Normal heart sounds.  Pulmonary:     Effort: Pulmonary effort is normal.     Breath sounds: Normal breath sounds.  Chest:     Comments: Patient declined examination.  Abdominal:     General: Bowel sounds are normal.     Palpations: Abdomen is soft.  Genitourinary:    Comments: Patient declined examination.  Musculoskeletal:        General: Normal range of motion.     Cervical back: Normal range of motion and neck supple.     Comments: Pain with light touch to right lower extremity laceration site (since March 2021). No evidence of broken skin, tenderness, edema, or erythema. Sensation intact.  Skin:    General: Skin is warm and dry.  Capillary Refill: Capillary refill takes less than 2 seconds.  Neurological:     General: No focal deficit present.     Mental Status: She is alert and oriented to person, place, and time.  Psychiatric:        Mood and Affect: Mood normal.        Behavior: Behavior normal.    ASSESSMENT AND PLAN: 1. Annual physical exam: - Counseled on 150 minutes of exercise per week as tolerated, healthy eating (including decreased daily intake of saturated fats, cholesterol, added sugars, sodium), STI prevention, and routine healthcare maintenance.  2. Screening for metabolic disorder: - CMP to check kidney function, liver function, and electrolyte balance.  - Comprehensive metabolic panel  3. Screening for deficiency anemia: - CBC to screen for anemia. - CBC  4. Diabetes mellitus screening: - Hemoglobin A1c to screen for pre-diabetes/diabetes. - Hemoglobin A1c  5. Screening cholesterol level: - Lipid panel to screen for high cholesterol.  - Lipid panel  6. Thyroid disorder screen: - TSH to check thyroid function.  - TSH  7. Need for hepatitis C screening test: - Hepatitis C antibody to screen for hepatitis C.  - Hepatitis C Antibody  8. Encounter for screening for HIV: -  HIV antibody to screen for human immunodeficiency virus.  - HIV antibody (with reflex)   9. Pap smear for cervical cancer screening: - Referral to Obstetrics / Gynecology for cervical cancer screening by PAP smear.  - Ambulatory referral to Gynecology  10. Essential hypertension: - Patient has not taken Lisinopril since initial prescription in March 2022.  - Today in office blood pressure normal.  - Will hold blood pressure medication for now.  - Counseled to follow-up with primary provider should blood pressures begin to increase. Patient verbalized understanding.   11. Mild intermittent asthma without complication: - Continue Albuterol inhaler and Albuterol nebulizer as prescribed.  - Follow-up with primary provider as scheduled.  - albuterol (PROVENTIL) (2.5 MG/3ML) 0.083% nebulizer solution; Take 3 mLs (2.5 mg total) by nebulization every 6 (six) hours as needed for wheezing or shortness of breath.  Dispense: 150 mL; Refill: 1 - albuterol (VENTOLIN HFA) 108 (90 Base) MCG/ACT inhaler; Inhale 2 puffs into the lungs every 6 (six) hours as needed for wheezing or shortness of breath.  Dispense: 8 g; Refill: 1  12. Hemorrhoids, unspecified hemorrhoid type: - Begin Hydrocortisone as prescribed.  - Follow-up with primary provider in 4 weeks or sooner if needed. - hydrocortisone (ANUSOL-HC) 2.5 % rectal cream; Place 1 application rectally 2 (two) times daily.  Dispense: 84 g; Refill: 0  13. Laceration of right lower leg, subsequent encounter: - Chronic pain since initial incident March 2022.  - Per patient request referral to Sports Medicine for further evaluation and management. - Ambulatory referral to Sports Medicine    Patient was given the opportunity to ask questions.  Patient verbalized understanding of the plan and was able to repeat key elements of the plan. Patient was given clear instructions to go to Emergency Department or return to medical center if symptoms don't improve,  worsen, or new problems develop.The patient verbalized understanding.   Orders Placed This Encounter  Procedures  . Hepatitis C Antibody  . HIV antibody (with reflex)  . CBC  . Comprehensive metabolic panel  . Lipid panel  . TSH  . Hemoglobin A1c  . Ambulatory referral to Gynecology  . Ambulatory referral to Sports Medicine     Requested Prescriptions   Signed Prescriptions Disp Refills  .  albuterol (PROVENTIL) (2.5 MG/3ML) 0.083% nebulizer solution 150 mL 1    Sig: Take 3 mLs (2.5 mg total) by nebulization every 6 (six) hours as needed for wheezing or shortness of breath.  Marland Kitchen albuterol (VENTOLIN HFA) 108 (90 Base) MCG/ACT inhaler 8 g 1    Sig: Inhale 2 puffs into the lungs every 6 (six) hours as needed for wheezing or shortness of breath.  . hydrocortisone (ANUSOL-HC) 2.5 % rectal cream 84 g 0    Sig: Place 1 application rectally 2 (two) times daily.    Follow-up with primary provider as scheduled.   Rema Fendt, NP

## 2020-07-17 ENCOUNTER — Other Ambulatory Visit: Payer: Self-pay

## 2020-07-17 ENCOUNTER — Ambulatory Visit (INDEPENDENT_AMBULATORY_CARE_PROVIDER_SITE_OTHER): Payer: BC Managed Care – PPO | Admitting: Family

## 2020-07-17 ENCOUNTER — Encounter: Payer: Self-pay | Admitting: Family

## 2020-07-17 VITALS — BP 118/80 | HR 73 | Temp 98.2°F | Resp 15 | Ht 61.34 in | Wt 156.8 lb

## 2020-07-17 DIAGNOSIS — Z130 Encounter for screening for diseases of the blood and blood-forming organs and certain disorders involving the immune mechanism: Secondary | ICD-10-CM | POA: Diagnosis not present

## 2020-07-17 DIAGNOSIS — Z1329 Encounter for screening for other suspected endocrine disorder: Secondary | ICD-10-CM

## 2020-07-17 DIAGNOSIS — Z13228 Encounter for screening for other metabolic disorders: Secondary | ICD-10-CM

## 2020-07-17 DIAGNOSIS — I10 Essential (primary) hypertension: Secondary | ICD-10-CM

## 2020-07-17 DIAGNOSIS — Z0000 Encounter for general adult medical examination without abnormal findings: Secondary | ICD-10-CM

## 2020-07-17 DIAGNOSIS — Z131 Encounter for screening for diabetes mellitus: Secondary | ICD-10-CM | POA: Diagnosis not present

## 2020-07-17 DIAGNOSIS — J4520 Mild intermittent asthma, uncomplicated: Secondary | ICD-10-CM

## 2020-07-17 DIAGNOSIS — S81811D Laceration without foreign body, right lower leg, subsequent encounter: Secondary | ICD-10-CM

## 2020-07-17 DIAGNOSIS — Z114 Encounter for screening for human immunodeficiency virus [HIV]: Secondary | ICD-10-CM

## 2020-07-17 DIAGNOSIS — Z13220 Encounter for screening for lipoid disorders: Secondary | ICD-10-CM

## 2020-07-17 DIAGNOSIS — K649 Unspecified hemorrhoids: Secondary | ICD-10-CM

## 2020-07-17 DIAGNOSIS — Z124 Encounter for screening for malignant neoplasm of cervix: Secondary | ICD-10-CM

## 2020-07-17 DIAGNOSIS — Z1159 Encounter for screening for other viral diseases: Secondary | ICD-10-CM

## 2020-07-17 MED ORDER — ALBUTEROL SULFATE HFA 108 (90 BASE) MCG/ACT IN AERS: 2.0000 | INHALATION_SPRAY | Freq: Four times a day (QID) | RESPIRATORY_TRACT | 1 refills | Status: DC | PRN

## 2020-07-17 MED ORDER — HYDROCORTISONE (PERIANAL) 2.5 % EX CREA: 1.0000 "application " | TOPICAL_CREAM | Freq: Two times a day (BID) | CUTANEOUS | 0 refills | Status: DC

## 2020-07-17 MED ORDER — ALBUTEROL SULFATE (2.5 MG/3ML) 0.083% IN NEBU: 2.5000 mg | INHALATION_SOLUTION | Freq: Four times a day (QID) | RESPIRATORY_TRACT | 1 refills | Status: DC | PRN

## 2020-07-17 NOTE — Progress Notes (Signed)
Annual physical exam  Needs albuterol refill and never picked up nebulizer solution so needs refill  Pt states that she is experiencing hemorrhoids has not tried any OTC remedies

## 2020-07-17 NOTE — Patient Instructions (Signed)
Preventive Care 84-42 Years Old, Female Preventive care refers to lifestyle choices and visits with your health care provider that can promote health and wellness. This includes:  A yearly physical exam. This is also called an annual wellness visit.  Regular dental and eye exams.  Immunizations.  Screening for certain conditions.  Healthy lifestyle choices, such as: ? Eating a healthy diet. ? Getting regular exercise. ? Not using drugs or products that contain nicotine and tobacco. ? Limiting alcohol use. What can I expect for my preventive care visit? Physical exam Your health care provider will check your:  Height and weight. These may be used to calculate your BMI (body mass index). BMI is a measurement that tells if you are at a healthy weight.  Heart rate and blood pressure.  Body temperature.  Skin for abnormal spots. Counseling Your health care provider may ask you questions about your:  Past medical problems.  Family's medical history.  Alcohol, tobacco, and drug use.  Emotional well-being.  Home life and relationship well-being.  Sexual activity.  Diet, exercise, and sleep habits.  Work and work Statistician.  Access to firearms.  Method of birth control.  Menstrual cycle.  Pregnancy history. What immunizations do I need? Vaccines are usually given at various ages, according to a schedule. Your health care provider will recommend vaccines for you based on your age, medical history, and lifestyle or other factors, such as travel or where you work.   What tests do I need? Blood tests  Lipid and cholesterol levels. These may be checked every 5 years, or more often if you are over 3 years old.  Hepatitis C test.  Hepatitis B test. Screening  Lung cancer screening. You may have this screening every year starting at age 73 if you have a 30-pack-year history of smoking and currently smoke or have quit within the past 15 years.  Colorectal cancer  screening. ? All adults should have this screening starting at age 52 and continuing until age 17. ? Your health care provider may recommend screening at age 49 if you are at increased risk. ? You will have tests every 1-10 years, depending on your results and the type of screening test.  Diabetes screening. ? This is done by checking your blood sugar (glucose) after you have not eaten for a while (fasting). ? You may have this done every 1-3 years.  Mammogram. ? This may be done every 1-2 years. ? Talk with your health care provider about when you should start having regular mammograms. This may depend on whether you have a family history of breast cancer.  BRCA-related cancer screening. This may be done if you have a family history of breast, ovarian, tubal, or peritoneal cancers.  Pelvic exam and Pap test. ? This may be done every 3 years starting at age 10. ? Starting at age 11, this may be done every 5 years if you have a Pap test in combination with an HPV test. Other tests  STD (sexually transmitted disease) testing, if you are at risk.  Bone density scan. This is done to screen for osteoporosis. You may have this scan if you are at high risk for osteoporosis. Talk with your health care provider about your test results, treatment options, and if necessary, the need for more tests. Follow these instructions at home: Eating and drinking  Eat a diet that includes fresh fruits and vegetables, whole grains, lean protein, and low-fat dairy products.  Take vitamin and mineral supplements  as recommended by your health care provider.  Do not drink alcohol if: ? Your health care provider tells you not to drink. ? You are pregnant, may be pregnant, or are planning to become pregnant.  If you drink alcohol: ? Limit how much you have to 0-1 drink a day. ? Be aware of how much alcohol is in your drink. In the [place] one drink equals one 12 oz bottle of beer (355 mL), one 5 oz glass of  wine (148 mL), or one 1 oz glass of hard liquor (44 mL).   Lifestyle  Take daily care of your teeth and gums. Brush your teeth every morning and night with fluoride toothpaste. Floss one time each day.  Stay active. Exercise for at least 30 minutes 5 or more days each week.  Do not use any products that contain nicotine or tobacco, such as cigarettes, e-cigarettes, and chewing tobacco. If you need help quitting, ask your health care provider.  Do not use drugs.  If you are sexually active, practice safe sex. Use a condom or other form of protection to prevent STIs (sexually transmitted infections).  If you do not wish to become pregnant, use a form of birth control. If you plan to become pregnant, see your health care provider for a prepregnancy visit.  If told by your health care provider, take low-dose aspirin daily starting at age 50.  Find healthy ways to cope with stress, such as: ? Meditation, yoga, or listening to music. ? Journaling. ? Talking to a trusted person. ? Spending time with friends and family. Safety  Always wear your seat belt while driving or riding in a vehicle.  Do not drive: ? If you have been drinking alcohol. Do not ride with someone who has been drinking. ? When you are tired or distracted. ? While texting.  Wear a helmet and other protective equipment during sports activities.  If you have firearms in your house, make sure you follow all gun safety procedures. What's next?  Visit your health care provider once a year for an annual wellness visit.  Ask your health care provider how often you should have your eyes and teeth checked.  Stay up to date on all vaccines. This information is not intended to replace advice given to you by your health care provider. Make sure you discuss any questions you have with your health care provider. Document Revised: 11/07/2019 Document Reviewed: 10/14/2017 Elsevier Patient Education  2021 Elsevier Inc.  

## 2020-07-18 ENCOUNTER — Telehealth: Payer: Self-pay | Admitting: Family

## 2020-07-18 LAB — COMPREHENSIVE METABOLIC PANEL
ALT: 18 IU/L (ref 0–32)
AST: 19 IU/L (ref 0–40)
Albumin/Globulin Ratio: 1.3 (ref 1.2–2.2)
Albumin: 4.4 g/dL (ref 3.8–4.8)
Alkaline Phosphatase: 77 IU/L (ref 44–121)
BUN/Creatinine Ratio: 10 (ref 9–23)
BUN: 10 mg/dL (ref 6–24)
Bilirubin Total: 0.6 mg/dL (ref 0.0–1.2)
CO2: 20 mmol/L (ref 20–29)
Calcium: 9.5 mg/dL (ref 8.7–10.2)
Chloride: 101 mmol/L (ref 96–106)
Creatinine, Ser: 1.01 mg/dL — ABNORMAL HIGH (ref 0.57–1.00)
Globulin, Total: 3.3 g/dL (ref 1.5–4.5)
Glucose: 76 mg/dL (ref 65–99)
Potassium: 3.7 mmol/L (ref 3.5–5.2)
Sodium: 139 mmol/L (ref 134–144)
Total Protein: 7.7 g/dL (ref 6.0–8.5)
eGFR: 72 mL/min/{1.73_m2} (ref >59)

## 2020-07-18 LAB — CBC
Hematocrit: 41.1 % (ref 34.0–46.6)
Hemoglobin: 13.2 g/dL (ref 11.1–15.9)
MCH: 25.7 pg — ABNORMAL LOW (ref 26.6–33.0)
MCHC: 32.1 g/dL (ref 31.5–35.7)
MCV: 80 fL (ref 79–97)
Platelets: 216 10*3/uL (ref 150–450)
RBC: 5.13 x10E6/uL (ref 3.77–5.28)
RDW: 14 % (ref 11.7–15.4)
WBC: 3.4 10*3/uL (ref 3.4–10.8)

## 2020-07-18 LAB — LIPID PANEL
Chol/HDL Ratio: 2.5 ratio (ref 0.0–4.4)
Cholesterol, Total: 197 mg/dL (ref 100–199)
HDL: 78 mg/dL (ref >39)
LDL Chol Calc (NIH): 103 mg/dL — ABNORMAL HIGH (ref 0–99)
Triglycerides: 89 mg/dL (ref 0–149)
VLDL Cholesterol Cal: 16 mg/dL (ref 5–40)

## 2020-07-18 LAB — HEPATITIS C ANTIBODY: Hep C Virus Ab: <0.1 s/co ratio (ref 0.0–0.9)

## 2020-07-18 LAB — HIV ANTIBODY (ROUTINE TESTING W REFLEX): HIV Screen 4th Generation wRfx: NONREACTIVE

## 2020-07-18 LAB — HEMOGLOBIN A1C
Est. average glucose Bld gHb Est-mCnc: 108 mg/dL
Hgb A1c MFr Bld: 5.4 % (ref 4.8–5.6)

## 2020-07-18 LAB — TSH: TSH: 1.27 u[IU]/mL (ref 0.450–4.500)

## 2020-07-18 NOTE — Telephone Encounter (Signed)
Pt decline to get covid vaccine at this time 

## 2020-07-18 NOTE — Progress Notes (Signed)
Kidney function normal.   Liver function normal.   Thyroid function normal.   No diabetes.   No anemia.   Hepatitis C negative.   HIV negative.   Cholesterol higher than expected. High cholesterol may increase risk of heart attack and/or stroke. Consider eating more fruits, vegetables, and lean baked meats such as chicken or fish. Moderate intensity exercise at least 150 minutes as tolerated per week may help as well. Encouraged to have rechecked in 3 to 6 months.   The following is for provider reference only: The 10-year ASCVD risk score Denman George DC Montez Hageman., et al., 2013) is: 0.5%   Values used to calculate the score:     Age: 40 years     Sex: Female     Is Non-Hispanic African American: Yes     Diabetic: No     Tobacco smoker: No     Systolic Blood Pressure: 118 mmHg     Is BP treated: Yes     HDL Cholesterol: 78 mg/dL     Total Cholesterol: 197 mg/dL

## 2020-07-22 ENCOUNTER — Encounter: Payer: Self-pay | Admitting: Emergency Medicine

## 2020-07-22 ENCOUNTER — Ambulatory Visit
Admission: EM | Admit: 2020-07-22 | Discharge: 2020-07-22 | Disposition: A | Discharge status: Home / Self Care | Payer: BC Managed Care – PPO | Attending: Emergency Medicine | Admitting: Emergency Medicine

## 2020-07-22 ENCOUNTER — Other Ambulatory Visit: Payer: Self-pay

## 2020-07-22 DIAGNOSIS — M546 Pain in thoracic spine: Secondary | ICD-10-CM

## 2020-07-22 DIAGNOSIS — M545 Low back pain, unspecified: Secondary | ICD-10-CM

## 2020-07-22 MED ORDER — KETOROLAC TROMETHAMINE 30 MG/ML IJ SOLN
30.0000 mg | Freq: Once | INTRAMUSCULAR | Status: AC
  Administered 2020-07-22: 30 mg via INTRAMUSCULAR

## 2020-07-22 MED ORDER — NAPROXEN 500 MG PO TABS: 500.0000 mg | ORAL_TABLET | Freq: Two times a day (BID) | ORAL | 0 refills | Status: DC

## 2020-07-22 MED ORDER — TIZANIDINE HCL 2 MG PO TABS: 2.0000 mg | ORAL_TABLET | Freq: Four times a day (QID) | ORAL | 0 refills | Status: DC | PRN

## 2020-07-22 NOTE — ED Notes (Signed)
Pt returned sts she never received call although this RN called pt x 3.  Will see pt

## 2020-07-22 NOTE — ED Notes (Signed)
Pt did not answer x 2 

## 2020-07-22 NOTE — Discharge Instructions (Addendum)
We gave you a shot of Toradol Naprosyn twice daily with food Tizanidine to supplement at home/bedtime, will cause drowsiness, do not drive or work after taking Alternate ice and heat Gentle stretching Follow-up if not improving or worsening

## 2020-07-22 NOTE — ED Notes (Signed)
Pt did not answer her phone or call in lobby

## 2020-07-22 NOTE — ED Triage Notes (Signed)
Pt sts lower back pain since she injured a couple of weeks ago at work that has been worse over last several days; pt sts some mild HA today also

## 2020-07-23 NOTE — ED Provider Notes (Signed)
[name]    CSN: [id number] Arrival date & time: 07/22/20  1656      History   Chief Complaint Chief Complaint  Patient presents with  . Back Pain    HPI Amy Guerrero is a 40 y.o. female presenting today for evaluation of back pain.  Reports approximately 3 weeks ago she lifted a heavy package at work and caused a pulling sensation in her lower back.  She reports symptoms did slightly improved, but worsened again this past week while at work.  Denies any direct trauma or impact to the back.  Denies any radiation into extremities.  Symptoms are worse on the right side.  Denies urinary symptoms.  Denies fevers.  Denies numbness tingling, saddle anesthesia.  HPI  Past Medical History:  Diagnosis Date  . Asthma   . Sinusitis     Patient Active Problem List   Diagnosis Date Noted  . Essential hypertension 05/07/2020  . Anxiety and depression 05/07/2020    Past Surgical History:  Procedure Laterality Date  . CARPAL TUNNEL RELEASE Right   . TUBAL LIGATION    . tubes tied      OB History   No obstetric history on file.      Home Medications    Prior to Admission medications   Medication Sig Start Date End Date Taking? Authorizing Provider  naproxen (NAPROSYN) 500 MG tablet Take 1 tablet (500 mg total) by mouth 2 (two) times daily. 07/22/20  Yes Maelie Chriswell C, PA-C  tiZANidine (ZANAFLEX) 2 MG tablet Take 1-2 tablets (2-4 mg total) by mouth every 6 (six) hours as needed for muscle spasms. 07/22/20  Yes Haedyn Breau C, PA-C  albuterol (PROVENTIL) (2.5 MG/3ML) 0.083% nebulizer solution Take 3 mLs (2.5 mg total) by nebulization every 6 (six) hours as needed for wheezing or shortness of breath. 07/17/20   Rema Fendt, NP  albuterol (VENTOLIN HFA) 108 (90 Base) MCG/ACT inhaler Inhale 2 puffs into the lungs every 6 (six) hours as needed for wheezing or shortness of breath. 07/17/20   Rema Fendt, NP  Cetirizine HCl 10 MG CAPS Take 1 capsule (10 mg total)  by mouth daily. 04/22/20 05/22/20  Bing Neighbors, FNP  hydrocortisone (ANUSOL-HC) 2.5 % rectal cream Place 1 application rectally 2 (two) times daily. 07/17/20   Rema Fendt, NP  ondansetron (ZOFRAN ODT) 4 MG disintegrating tablet Take 1 tablet (4 mg total) by mouth every 8 (eight) hours as needed for nausea or vomiting. 06/18/20   Adham Johnson, Fran Lowes C, PA-C  Spacer/Aero-Holding Chambers (AEROCHAMBER PLUS) inhaler Use as instructed 05/07/20   Rema Fendt, NP  dicyclomine (BENTYL) 20 MG tablet Take 1 tablet (20 mg total) by mouth 4 (four) times daily -  before meals and at bedtime. Patient not taking: Reported on 07/17/2020 06/18/20 07/22/20  Apollonia Amini C, PA-C  famotidine (PEPCID) 40 MG tablet Take 1 tablet (40 mg total) by mouth daily. Patient not taking: Reported on 07/17/2020 06/18/20 07/22/20  Dillyn Menna C, PA-C  lisinopril (ZESTRIL) 5 MG tablet Take 1 tablet (5 mg total) by mouth daily. Patient not taking: Reported on 07/17/2020 05/07/20 07/22/20  Rema Fendt, NP  sertraline (ZOLOFT) 25 MG tablet Take 1 tablet (25 mg total) by mouth at bedtime. Patient not taking: Reported on 07/17/2020 05/07/20 07/22/20  Rema Fendt, NP    Family History Family History  Problem Relation Age of Onset  . Diabetes Mother   . Diabetes Father  Social History Social History   Tobacco Use  . Smoking status: Never Smoker  . Smokeless tobacco: Never Used  Vaping Use  . Vaping Use: Every day  Substance Use Topics  . Alcohol use: Yes    Alcohol/week: 1.0 standard drink    Types: 1 Glasses of wine per week    Comment: occassionally  . Drug use: Yes    Types: Marijuana     Allergies   Patient has no known allergies.   Review of Systems Review of Systems  Constitutional: Negative for fatigue and fever.  Eyes: Negative for visual disturbance.  Respiratory: Negative for shortness of breath.   Cardiovascular: Negative for chest pain.  Gastrointestinal: Negative for abdominal pain, nausea and  vomiting.  Musculoskeletal: Positive for back pain and myalgias. Negative for arthralgias and joint swelling.  Skin: Negative for color change, rash and wound.  Neurological: Negative for dizziness, weakness, light-headedness and headaches.     Physical Exam Triage Vital Signs ED Triage Vitals [07/22/20 2007]  Enc Vitals Group     BP 122/83     Pulse Rate (!) 101     Resp 18     Temp 98.2 F (36.8 C)     Temp Source Oral     SpO2 97 %     Weight      Height      Head Circumference      Peak Flow      Pain Score 5     Pain Loc      Pain Edu?      Excl. in GC?    No data found.  Updated Vital Signs BP 122/83 (BP Location: Left Arm)   Pulse (!) 101   Temp 98.2 F (36.8 C) (Oral)   Resp 18   SpO2 97%   Visual Acuity Right Eye Distance:   Left Eye Distance:   Bilateral Distance:    Right Eye Near:   Left Eye Near:    Bilateral Near:     Physical Exam Vitals and nursing note reviewed.  Constitutional:      Appearance: She is well-developed.     Comments: No acute distress  HENT:     Head: Normocephalic and atraumatic.     Nose: Nose normal.  Eyes:     Conjunctiva/sclera: Conjunctivae normal.  Cardiovascular:     Rate and Rhythm: Normal rate.  Pulmonary:     Effort: Pulmonary effort is normal. No respiratory distress.  Abdominal:     General: There is no distension.  Musculoskeletal:        General: Normal range of motion.     Cervical back: Neck supple.     Comments: Diffuse tenderness throughout thoracic and lumbar spine midline, no palpable deformity or step-off, no focal tenderness, diffuse tenderness throughout right thoracic and lumbar musculature as well, full active range of motion of upper extremities, strength at shoulders 5/5 and equal bilaterally  Hip and knee strength 5/5 bilaterally  Skin:    General: Skin is warm and dry.  Neurological:     Mental Status: She is alert and oriented to person, place, and time.      UC Treatments /  Results  Labs (all labs ordered are listed, but only abnormal results are displayed) Labs Reviewed - No data to display  EKG   Radiology No results found.  Procedures Procedures (including critical care time)  Medications Ordered in UC Medications  ketorolac (TORADOL) 30 MG/ML injection 30 mg (30 mg  Intramuscular Given 07/22/20 2029)    Initial Impression / Assessment and Plan / UC Course  I have reviewed the triage vital signs and the nursing notes.  Pertinent labs & imaging results that were available during my care of the patient were reviewed by me and considered in my medical decision making (see chart for details).     Thoracic and lumbar back pain, no neurodeficits, treating with Toradol prior to discharge, Naprosyn and tizanidine at home, activity modification.  Discussed strict return precautions. Patient verbalized understanding and is agreeable with plan.  Final Clinical Impressions(s) / UC Diagnoses   Final diagnoses:  Acute bilateral low back pain without sciatica  Acute bilateral thoracic back pain     Discharge Instructions     We gave you a shot of Toradol Naprosyn twice daily with food Tizanidine to supplement at home/bedtime, will cause drowsiness, do not drive or work after taking Alternate ice and heat Gentle stretching Follow-up if not improving or worsening    ED Prescriptions    Medication Sig Dispense Auth. Provider   naproxen (NAPROSYN) 500 MG tablet Take 1 tablet (500 mg total) by mouth 2 (two) times daily. 30 tablet Delany Steury C, PA-C   tiZANidine (ZANAFLEX) 2 MG tablet Take 1-2 tablets (2-4 mg total) by mouth every 6 (six) hours as needed for muscle spasms. 30 tablet Latresa Gasser, [place] C, PA-C     PDMP not reviewed this encounter.   Lew Dawes, [place] 07/23/20 [phone]

## 2020-07-26 ENCOUNTER — Ambulatory Visit: Payer: BC Managed Care – PPO | Admitting: Family Medicine

## 2020-07-26 NOTE — Progress Notes (Deleted)
  Amy Guerrero - 40 y.o. female MRN [id number]  Date of birth: 31-Oct-1980  SUBJECTIVE:  Including CC & ROS.  No chief complaint on file.   Amy Guerrero is a 40 y.o. female that is  ***.    Review of Systems See HPI   HISTORY: Past Medical, Surgical, Social, and Family History Reviewed & Updated per EMR.   Pertinent Historical Findings include:  Past Medical History:  Diagnosis Date   Asthma    Sinusitis     Past Surgical History:  Procedure Laterality Date   CARPAL TUNNEL RELEASE Right    TUBAL LIGATION     tubes tied      Family History  Problem Relation Age of Onset   Diabetes Mother    Diabetes Father     Social History   Socioeconomic History   Marital status: Single    Spouse name: Not on file   Number of children: Not on file   Years of education: Not on file   Highest education level: Not on file  Occupational History   Not on file  Tobacco Use   Smoking status: Never   Smokeless tobacco: Never  Vaping Use   Vaping Use: Every day  Substance and Sexual Activity   Alcohol use: Yes    Alcohol/week: 1.0 standard drink    Types: 1 Glasses of wine per week    Comment: occassionally   Drug use: Yes    Types: Marijuana   Sexual activity: Not on file  Other Topics Concern   Not on file  Social History Narrative   Not on file   Social Determinants of Health   Financial Resource Strain: Not on file  Food Insecurity: Not on file  Transportation Needs: Not on file  Physical Activity: Not on file  Stress: Not on file  Social Connections: Not on file  Intimate Partner Violence: Not on file     PHYSICAL EXAM:  VS: There were no vitals taken for this visit. Physical Exam Gen: NAD, alert, cooperative with exam, well-appearing MSK:      ASSESSMENT & PLAN:   No problem-specific Assessment & Plan notes found for this encounter.

## 2020-09-22 ENCOUNTER — Ambulatory Visit
Admission: EM | Admit: 2020-09-22 | Discharge: 2020-09-22 | Disposition: A | Discharge status: Home / Self Care | Payer: BC Managed Care – PPO | Attending: Urgent Care | Admitting: Urgent Care

## 2020-09-22 DIAGNOSIS — J4520 Mild intermittent asthma, uncomplicated: Secondary | ICD-10-CM

## 2020-09-22 DIAGNOSIS — R1010 Upper abdominal pain, unspecified: Secondary | ICD-10-CM

## 2020-09-22 DIAGNOSIS — F1210 Cannabis abuse, uncomplicated: Secondary | ICD-10-CM

## 2020-09-22 DIAGNOSIS — J4530 Mild persistent asthma, uncomplicated: Secondary | ICD-10-CM

## 2020-09-22 MED ORDER — ONDANSETRON 8 MG PO TBDP: 8.0000 mg | ORAL_TABLET | Freq: Three times a day (TID) | ORAL | 0 refills | Status: DC | PRN

## 2020-09-22 MED ORDER — ALBUTEROL SULFATE HFA 108 (90 BASE) MCG/ACT IN AERS: 2.0000 | INHALATION_SPRAY | Freq: Four times a day (QID) | RESPIRATORY_TRACT | 1 refills | Status: DC | PRN

## 2020-09-22 MED ORDER — ALBUTEROL SULFATE (2.5 MG/3ML) 0.083% IN NEBU: 2.5000 mg | INHALATION_SOLUTION | Freq: Four times a day (QID) | RESPIRATORY_TRACT | 1 refills | Status: DC | PRN

## 2020-09-22 NOTE — ED Triage Notes (Signed)
Onset yesterday of abdominal pain. Pt localized pain to the upper middle area. No n/v/d. Pain is interfering with her sleep.   Four day h/o RLE pain. Pt notes h/o nerve damage on her RLE. Spt notes transitioning from sitting to standing aggravates sxs. Confirms swelling. No falls or injuries.   Pt would like her left ear rechecked after recent Otitis Media. Pt notes that she lost her antibiotics and did not finish the course. Also needs refill on inhaler.

## 2020-09-22 NOTE — Discharge Instructions (Addendum)
Do not use any nonsteroidal anti-inflammatories (NSAIDs) like ibuprofen, Motrin, naproxen, Aleve, etc. which are all available over-the-counter.  Please just use Tylenol at a dose of 500mg-650mg once every 6 hours as needed for your aches, pains, fevers. 

## 2020-09-22 NOTE — ED Provider Notes (Signed)
[facility]   MRN: [id number] DOB: 06-Oct-1980  Subjective:   Amy Guerrero is a 40 y.o. female presenting for 1 day history of upper abdominal pain, epigastric pain, rated 7/10. Was 10/10 last night. No history of GI issues. Denies fever, n/v, diarrhea, constipation, bloody stools, dysuria, urinary frequency, vaginal discharge, pelvic pain.  Patient does have a history of asthma, would like a refill on her inhaler.  She smokes marijuana 3 times daily.  Admits that she is actually had more persistent GI issues in the past year.  Has not seen a gastroenterologist for this.  She does have a PCP.  No current facility-administered medications for this encounter.  Current Outpatient Medications:    albuterol (PROVENTIL) (2.5 MG/3ML) 0.083% nebulizer solution, Take 3 mLs (2.5 mg total) by nebulization every 6 (six) hours as needed for wheezing or shortness of breath., Disp: 150 mL, Rfl: 1   albuterol (VENTOLIN HFA) 108 (90 Base) MCG/ACT inhaler, Inhale 2 puffs into the lungs every 6 (six) hours as needed for wheezing or shortness of breath., Disp: 8 g, Rfl: 1   Cetirizine HCl 10 MG CAPS, Take 1 capsule (10 mg total) by mouth daily., Disp: 30 capsule, Rfl: 0   hydrocortisone (ANUSOL-HC) 2.5 % rectal cream, Place 1 application rectally 2 (two) times daily., Disp: 84 g, Rfl: 0   naproxen (NAPROSYN) 500 MG tablet, Take 1 tablet (500 mg total) by mouth 2 (two) times daily., Disp: 30 tablet, Rfl: 0   ondansetron (ZOFRAN ODT) 4 MG disintegrating tablet, Take 1 tablet (4 mg total) by mouth every 8 (eight) hours as needed for nausea or vomiting., Disp: 20 tablet, Rfl: 0   Spacer/Aero-Holding Chambers (AEROCHAMBER PLUS) inhaler, Use as instructed, Disp: 1 each, Rfl: 2   tiZANidine (ZANAFLEX) 2 MG tablet, Take 1-2 tablets (2-4 mg total) by mouth every 6 (six) hours as needed for muscle spasms., Disp: 30 tablet, Rfl: 0   No Known Allergies  Past Medical History:  Diagnosis Date   Asthma     Sinusitis      Past Surgical History:  Procedure Laterality Date   CARPAL TUNNEL RELEASE Right    TUBAL LIGATION     tubes tied      Family History  Problem Relation Age of Onset   Diabetes Mother    Diabetes Father     Social History   Tobacco Use   Smoking status: Never   Smokeless tobacco: Never  Vaping Use   Vaping Use: Former  Substance Use Topics   Alcohol use: Yes    Alcohol/week: 1.0 standard drink    Types: 1 Glasses of wine per week    Comment: occassionally   Drug use: Yes    Types: Marijuana    ROS   Objective:   Vitals: BP 125/81 (BP Location: Left Arm)   Pulse 89   Temp 98.5 F (36.9 C) (Oral)   Resp 18   SpO2 98%   Physical Exam Constitutional:      General: She is not in acute distress.    Appearance: Normal appearance. She is well-developed and normal weight. She is not ill-appearing, toxic-appearing or diaphoretic.  HENT:     Head: Normocephalic and atraumatic.     Right Ear: External ear normal.     Left Ear: External ear normal.     Nose: Nose normal.     Mouth/Throat:     Mouth: Mucous membranes are moist.     Pharynx: Oropharynx is clear.  Eyes:     General: No scleral icterus.    Extraocular Movements: Extraocular movements intact.     Pupils: Pupils are equal, round, and reactive to light.  Cardiovascular:     Rate and Rhythm: Normal rate and regular rhythm.     Pulses: Normal pulses.     Heart sounds: Normal heart sounds. No murmur heard.   No friction rub. No gallop.  Pulmonary:     Effort: Pulmonary effort is normal. No respiratory distress.     Breath sounds: Normal breath sounds. No stridor. No wheezing, rhonchi or rales.  Abdominal:     General: Bowel sounds are normal. There is no distension.     Palpations: Abdomen is soft. There is no mass.     Tenderness: There is generalized abdominal tenderness and tenderness in the epigastric area. There is no right CVA tenderness, left CVA tenderness, guarding or rebound.   Skin:    General: Skin is warm and dry.     Coloration: Skin is not pale.     Findings: No rash.  Neurological:     General: No focal deficit present.     Mental Status: She is alert and oriented to person, place, and time.  Psychiatric:        Mood and Affect: Mood normal.        Behavior: Behavior normal.        Thought Content: Thought content normal.        Judgment: Judgment normal.    Assessment and Plan :   PDMP not reviewed this encounter.  1. Pain of upper abdomen   2. Marijuana abuse   3. Mild persistent asthma, unspecified whether complicated   4. Mild intermittent asthma without complication     No signs of an acute abdomen.  Suspect that patient pain is related to her frequent daily marijuana use.  Counseled against using this as well due to her asthma.  Refilled her inhalers.  Recommended supportive care.  Follow-up with PCP, seek consultation with a gastroenterologist. Counseled patient on potential for adverse effects with medications prescribed/recommended today, ER and return-to-clinic precautions discussed, patient verbalized understanding.    Wallis Bamberg, [place] 09/22/20 [phone]

## 2020-11-15 ENCOUNTER — Ambulatory Visit: Payer: BC Managed Care – PPO | Admitting: Nurse Practitioner

## 2020-11-25 ENCOUNTER — Encounter: Payer: Self-pay | Admitting: Emergency Medicine

## 2020-11-25 ENCOUNTER — Other Ambulatory Visit: Payer: Self-pay

## 2020-11-25 ENCOUNTER — Ambulatory Visit
Admission: EM | Admit: 2020-11-25 | Discharge: 2020-11-25 | Disposition: A | Discharge status: Home / Self Care | Payer: BC Managed Care – PPO | Attending: Physician Assistant | Admitting: Physician Assistant

## 2020-11-25 DIAGNOSIS — M546 Pain in thoracic spine: Secondary | ICD-10-CM | POA: Diagnosis not present

## 2020-11-25 MED ORDER — PREDNISONE 20 MG PO TABS: 40.0000 mg | ORAL_TABLET | Freq: Every day | ORAL | 0 refills | Status: AC

## 2020-11-25 MED ORDER — CYCLOBENZAPRINE HCL 10 MG PO TABS: 10.0000 mg | ORAL_TABLET | Freq: Two times a day (BID) | ORAL | 0 refills | Status: DC | PRN

## 2020-11-25 NOTE — ED Provider Notes (Signed)
[name]    CSN: [id number] Arrival date & time: 11/25/20  1803      History   Chief Complaint Chief Complaint  Patient presents with   Back Pain    HPI Amy Guerrero is a 40 y.o. female.   Patient here today for evaluation of left lower back pain that started yesterday.  She reports that pain is present from thoracic back to lower back.  She has had this occur in the past and has needed some time off work for rest.  She reports that she does do heavy lifting and repetitive lifting at work and thinks this might be the cause of her symptoms.  She denies any numbness or tingling.  She reports that movement makes pain worse.  The history is provided by the patient.  Back Pain Associated symptoms: no fever and no numbness    Past Medical History:  Diagnosis Date   Asthma    Sinusitis     Patient Active Problem List   Diagnosis Date Noted   Essential hypertension 05/07/2020   Anxiety and depression 05/07/2020    Past Surgical History:  Procedure Laterality Date   CARPAL TUNNEL RELEASE Right    TUBAL LIGATION     tubes tied      OB History   No obstetric history on file.      Home Medications    Prior to Admission medications   Medication Sig Start Date End Date Taking? Authorizing Provider  cyclobenzaprine (FLEXERIL) 10 MG tablet Take 1 tablet (10 mg total) by mouth 2 (two) times daily as needed for muscle spasms. 11/25/20  Yes Tomi Bamberger, PA-C  predniSONE (DELTASONE) 20 MG tablet Take 2 tablets (40 mg total) by mouth daily with breakfast for 5 days. 11/25/20 11/30/20 Yes Tomi Bamberger, PA-C  albuterol (PROVENTIL) (2.5 MG/3ML) 0.083% nebulizer solution Take 3 mLs (2.5 mg total) by nebulization every 6 (six) hours as needed for wheezing or shortness of breath. 09/22/20   Wallis Bamberg, PA-C  albuterol (VENTOLIN HFA) 108 (90 Base) MCG/ACT inhaler Inhale 2 puffs into the lungs every 6 (six) hours as needed for wheezing or shortness of breath.  09/22/20   Wallis Bamberg, PA-C  Cetirizine HCl 10 MG CAPS Take 1 capsule (10 mg total) by mouth daily. 04/22/20 05/22/20  Bing Neighbors, FNP  hydrocortisone (ANUSOL-HC) 2.5 % rectal cream Place 1 application rectally 2 (two) times daily. 07/17/20   Rema Fendt, NP  naproxen (NAPROSYN) 500 MG tablet Take 1 tablet (500 mg total) by mouth 2 (two) times daily. 07/22/20   Wieters, Hallie C, PA-C  ondansetron (ZOFRAN-ODT) 8 MG disintegrating tablet Take 1 tablet (8 mg total) by mouth every 8 (eight) hours as needed for nausea or vomiting. 09/22/20   Wallis Bamberg, PA-C  Spacer/Aero-Holding Chambers (AEROCHAMBER PLUS) inhaler Use as instructed 05/07/20   Rema Fendt, NP  dicyclomine (BENTYL) 20 MG tablet Take 1 tablet (20 mg total) by mouth 4 (four) times daily -  before meals and at bedtime. Patient not taking: Reported on 07/17/2020 06/18/20 07/22/20  Wieters, Hallie C, PA-C  famotidine (PEPCID) 40 MG tablet Take 1 tablet (40 mg total) by mouth daily. Patient not taking: Reported on 07/17/2020 06/18/20 07/22/20  Wieters, Hallie C, PA-C  lisinopril (ZESTRIL) 5 MG tablet Take 1 tablet (5 mg total) by mouth daily. Patient not taking: Reported on 07/17/2020 05/07/20 07/22/20  Rema Fendt, NP  sertraline (ZOLOFT) 25 MG tablet Take 1 tablet (  25 mg total) by mouth at bedtime. Patient not taking: Reported on 07/17/2020 05/07/20 07/22/20  Rema Fendt, NP    Family History Family History  Problem Relation Age of Onset   Diabetes Mother    Diabetes Father     Social History Social History   Tobacco Use   Smoking status: Never   Smokeless tobacco: Never  Vaping Use   Vaping Use: Former  Substance Use Topics   Alcohol use: Yes    Alcohol/week: 1.0 standard drink    Types: 1 Glasses of wine per week    Comment: occassionally   Drug use: Yes    Types: Marijuana     Allergies   Patient has no known allergies.   Review of Systems Review of Systems  Constitutional:  Negative for chills and fever.  Eyes:   Negative for discharge and redness.  Respiratory:  Negative for shortness of breath.   Musculoskeletal:  Positive for back pain and myalgias.  Neurological:  Negative for numbness.    Physical Exam Triage Vital Signs ED Triage Vitals  Enc Vitals Group     BP 11/25/20 1926 133/87     Pulse Rate 11/25/20 1926 68     Resp 11/25/20 1926 16     Temp 11/25/20 1926 98.3 F (36.8 C)     Temp Source 11/25/20 1926 Oral     SpO2 11/25/20 1926 99 %     Weight --      Height --      Head Circumference --      Peak Flow --      Pain Score 11/25/20 1927 7     Pain Loc --      Pain Edu? --      Excl. in GC? --    No data found.  Updated Vital Signs BP 133/87 (BP Location: Left Arm)   Pulse 68   Temp 98.3 F (36.8 C) (Oral)   Resp 16   SpO2 99%   Physical Exam Vitals and nursing note reviewed.  Constitutional:      General: She is not in acute distress.    Appearance: Normal appearance. She is not ill-appearing.  HENT:     Head: Normocephalic and atraumatic.  Eyes:     Conjunctiva/sclera: Conjunctivae normal.  Cardiovascular:     Rate and Rhythm: Normal rate.  Pulmonary:     Effort: Pulmonary effort is normal.  Musculoskeletal:     Comments: Muscle spasm noted to left thoracic back  Neurological:     Mental Status: She is alert.  Psychiatric:        Mood and Affect: Mood normal.        Behavior: Behavior normal.        Thought Content: Thought content normal.     UC Treatments / Results  Labs (all labs ordered are listed, but only abnormal results are displayed) Labs Reviewed - No data to display  EKG   Radiology No results found.  Procedures Procedures (including critical care time)  Medications Ordered in UC Medications - No data to display  Initial Impression / Assessment and Plan / UC Course  I have reviewed the triage vital signs and the nursing notes.  Pertinent labs & imaging results that were available during my care of the patient were reviewed  by me and considered in my medical decision making (see chart for details).   Suspect likely muscular strain. Will treat with steroid burst and muscle relaxer.  Note  provided for work.  Encouraged follow-up with any further concerns or symptoms fail to improve.  Final Clinical Impressions(s) / UC Diagnoses   Final diagnoses:  Acute left-sided thoracic back pain     Discharge Instructions      Take medication as prescribed. Follow up with any further concerns.      ED Prescriptions     Medication Sig Dispense Auth. Provider   predniSONE (DELTASONE) 20 MG tablet Take 2 tablets (40 mg total) by mouth daily with breakfast for 5 days. 10 tablet Erma Pinto F, PA-C   cyclobenzaprine (FLEXERIL) 10 MG tablet Take 1 tablet (10 mg total) by mouth 2 (two) times daily as needed for muscle spasms. 20 tablet Tomi Bamberger, PA-C      PDMP not reviewed this encounter.   Tomi Bamberger, PA-C 11/26/20 [phone]

## 2020-11-25 NOTE — Discharge Instructions (Addendum)
Take medication as prescribed. Follow up with any further concerns.  

## 2020-11-25 NOTE — ED Triage Notes (Signed)
Left lower back pain starting yesterday that goes all the way up to her shoulder. 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 Denies dysuria, hematuria, and fever. Reports this has happened before and was able to take a couple days off work with pain medication to help relieve symptoms. Says she's using ibuprofen OTC with some relief.

## 2021-01-03 ENCOUNTER — Other Ambulatory Visit: Payer: BC Managed Care – PPO

## 2021-01-03 ENCOUNTER — Other Ambulatory Visit: Payer: Self-pay

## 2021-01-03 ENCOUNTER — Ambulatory Visit
Admission: RE | Admit: 2021-01-03 | Discharge: 2021-01-03 | Disposition: A | Discharge status: Home / Self Care | Payer: BC Managed Care – PPO | Source: Ambulatory Visit | Attending: Physician Assistant | Admitting: Physician Assistant

## 2021-01-03 VITALS — BP 136/88 | HR 81 | Temp 98.0°F | Resp 16

## 2021-01-03 DIAGNOSIS — H6592 Unspecified nonsuppurative otitis media, left ear: Secondary | ICD-10-CM | POA: Diagnosis not present

## 2021-01-03 DIAGNOSIS — Z13220 Encounter for screening for lipoid disorders: Secondary | ICD-10-CM

## 2021-01-03 DIAGNOSIS — M79601 Pain in right arm: Secondary | ICD-10-CM

## 2021-01-03 DIAGNOSIS — E7800 Pure hypercholesterolemia, unspecified: Secondary | ICD-10-CM

## 2021-01-03 MED ORDER — AMOXICILLIN 500 MG PO CAPS: 500.0000 mg | ORAL_CAPSULE | Freq: Three times a day (TID) | ORAL | 0 refills | Status: DC

## 2021-01-03 MED ORDER — DICLOFENAC SODIUM 75 MG PO TBEC: 75.0000 mg | DELAYED_RELEASE_TABLET | Freq: Two times a day (BID) | ORAL | 0 refills | Status: DC

## 2021-01-03 NOTE — ED Triage Notes (Addendum)
C/o left ear pain and sinus pain on-going since Tuesday.  Also complaining of right arm pain extending from wrist up arm. 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

## 2021-01-03 NOTE — Discharge Instructions (Addendum)
Return if any problems.

## 2021-01-04 LAB — LIPID PANEL
Chol/HDL Ratio: 2.5 ratio (ref 0.0–4.4)
Cholesterol, Total: 171 mg/dL (ref 100–199)
HDL: 69 mg/dL (ref >39)
LDL Chol Calc (NIH): 91 mg/dL (ref 0–99)
Triglycerides: 52 mg/dL (ref 0–149)
VLDL Cholesterol Cal: 11 mg/dL (ref 5–40)

## 2021-01-05 NOTE — ED Provider Notes (Signed)
[name]    CSN: [id number] Arrival date & time: 01/03/21  0908      History   Chief Complaint Chief Complaint  Patient presents with   Appointment   Otalgia   Arm Pain    HPI Amy Guerrero is a 40 y.o. female.   Pt also complains of pain in her right arm.  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    The history is provided by the patient. No language interpreter was used.  Otalgia Location:  Left Quality:  Aching Severity:  Moderate Onset quality:  Gradual Progression:  Worsening Chronicity:  New Relieved by:  Nothing Arm Pain   Past Medical History:  Diagnosis Date   Asthma    Sinusitis     Patient Active Problem List   Diagnosis Date Noted   Essential hypertension 05/07/2020   Anxiety and depression 05/07/2020    Past Surgical History:  Procedure Laterality Date   CARPAL TUNNEL RELEASE Right    TUBAL LIGATION     tubes tied      OB History   No obstetric history on file.      Home Medications    Prior to Admission medications   Medication Sig Start Date End Date Taking? Authorizing Provider  amoxicillin (AMOXIL) 500 MG capsule Take 1 capsule (500 mg total) by mouth 3 (three) times daily. 01/03/21  Yes Elson Areas, PA-C  diclofenac (VOLTAREN) 75 MG EC tablet Take 1 tablet (75 mg total) by mouth 2 (two) times daily. 01/03/21  Yes Cheron Schaumann K, PA-C  albuterol (PROVENTIL) (2.5 MG/3ML) 0.083% nebulizer solution Take 3 mLs (2.5 mg total) by nebulization every 6 (six) hours as needed for wheezing or shortness of breath. 09/22/20   Wallis Bamberg, PA-C  albuterol (VENTOLIN HFA) 108 (90 Base) MCG/ACT inhaler Inhale 2 puffs into the lungs every 6 (six) hours as needed for wheezing or shortness of breath. 09/22/20   Wallis Bamberg, PA-C  Cetirizine HCl 10 MG CAPS Take 1 capsule (10 mg total) by mouth daily. 04/22/20 05/22/20  Bing Neighbors, FNP  cyclobenzaprine (FLEXERIL) 10 MG tablet Take 1 tablet (10 mg total) by mouth 2 (two) times  daily as needed for muscle spasms. 11/25/20   Tomi Bamberger, PA-C  hydrocortisone (ANUSOL-HC) 2.5 % rectal cream Place 1 application rectally 2 (two) times daily. 07/17/20   Rema Fendt, NP  naproxen (NAPROSYN) 500 MG tablet Take 1 tablet (500 mg total) by mouth 2 (two) times daily. 07/22/20   Wieters, Hallie C, PA-C  ondansetron (ZOFRAN-ODT) 8 MG disintegrating tablet Take 1 tablet (8 mg total) by mouth every 8 (eight) hours as needed for nausea or vomiting. 09/22/20   Wallis Bamberg, PA-C  Spacer/Aero-Holding Chambers (AEROCHAMBER PLUS) inhaler Use as instructed 05/07/20   Rema Fendt, NP  dicyclomine (BENTYL) 20 MG tablet Take 1 tablet (20 mg total) by mouth 4 (four) times daily -  before meals and at bedtime. Patient not taking: Reported on 07/17/2020 06/18/20 07/22/20  Wieters, Hallie C, PA-C  famotidine (PEPCID) 40 MG tablet Take 1 tablet (40 mg total) by mouth daily. Patient not taking: Reported on 07/17/2020 06/18/20 07/22/20  Wieters, Hallie C, PA-C  lisinopril (ZESTRIL) 5 MG tablet Take 1 tablet (5 mg total) by mouth daily. Patient not taking: Reported on 07/17/2020 05/07/20 07/22/20  Rema Fendt, NP  sertraline (ZOLOFT) 25 MG tablet Take 1 tablet (25 mg total) by mouth at bedtime. Patient not taking: Reported on  07/17/2020 05/07/20 07/22/20  Rema Fendt, NP    Family History Family History  Problem Relation Age of Onset   Diabetes Mother    Diabetes Father     Social History Social History   Tobacco Use   Smoking status: Never   Smokeless tobacco: Never  Vaping Use   Vaping Use: Former  Substance Use Topics   Alcohol use: Yes    Alcohol/week: 1.0 standard drink    Types: 1 Glasses of wine per week    Comment: occassionally   Drug use: Yes    Types: Marijuana     Allergies   Patient has no known allergies.   Review of Systems Review of Systems  HENT:  Positive for ear pain.   Musculoskeletal:  Positive for myalgias.  All other systems reviewed and are  negative.   Physical Exam Triage Vital Signs ED Triage Vitals  Enc Vitals Group     BP 01/03/21 0923 136/88     Pulse Rate 01/03/21 0923 81     Resp 01/03/21 0923 16     Temp 01/03/21 0923 98 F (36.7 C)     Temp Source 01/03/21 0923 Oral     SpO2 01/03/21 0923 99 %     Weight --      Height --      Head Circumference --      Peak Flow --      Pain Score 01/03/21 0924 0     Pain Loc --      Pain Edu? --      Excl. in GC? --    No data found.  Updated Vital Signs BP 136/88 (BP Location: Left Arm)   Pulse 81   Temp 98 F (36.7 C) (Oral)   Resp 16   SpO2 99%   Visual Acuity Right Eye Distance:   Left Eye Distance:   Bilateral Distance:    Right Eye Near:   Left Eye Near:    Bilateral Near:     Physical Exam Vitals and nursing note reviewed.  Constitutional:      Appearance: She is well-developed.  HENT:     Head: Normocephalic.     Right Ear: Tympanic membrane normal.     Ears:     Comments: Left tm erythematous  Pulmonary:     Effort: Pulmonary effort is normal.  Abdominal:     General: There is no distension.  Musculoskeletal:        General: Tenderness present. No swelling.     Cervical back: Normal range of motion.     Comments: Diffusely tender right forearm,elbow and bicep area  Skin:    General: Skin is warm.  Neurological:     Mental Status: She is alert and oriented to person, place, and time. Mental status is at baseline.  Psychiatric:        Mood and Affect: Mood normal.     UC Treatments / Results  Labs (all labs ordered are listed, but only abnormal results are displayed) Labs Reviewed - No data to display  EKG   Radiology No results found.  Procedures Procedures (including critical care time)  Medications Ordered in UC Medications - No data to display  Initial Impression / Assessment and Plan / UC Course  I have reviewed the triage vital signs and the nursing notes.  Pertinent labs & imaging results that were available  during my care of the patient were reviewed by me and considered in my medical  decision making (see chart for details).      Final Clinical Impressions(s) / UC Diagnoses   Final diagnoses:  Other nonsuppurative otitis media of left ear, unspecified chronicity  Arm pain, musculoskeletal, right     Discharge Instructions      Return if any problems.     ED Prescriptions     Medication Sig Dispense Auth. Provider   amoxicillin (AMOXIL) 500 MG capsule Take 1 capsule (500 mg total) by mouth 3 (three) times daily. 30 capsule Brynja Marker K, [place]   diclofenac (VOLTAREN) 75 MG EC tablet Take 1 tablet (75 mg total) by mouth 2 (two) times daily. 20 tablet Elson Areas, [place]      PDMP not reviewed this encounter. An After Visit Summary was printed and given to the patient.    Elson Areas, [place] 01/05/21 1334

## 2021-01-11 NOTE — Progress Notes (Signed)
Patient ID: Amy Guerrero, female    DOB: 11-18-1980  MRN: [id number]  CC: Hypertension Follow-Up   Subjective: Amy Guerrero is a 40 y.o. female who presents for hypertension follow-up. She is accompanied by her significant other.  Her concerns today include:   HYPERTENSION FOLLOW-UP: 07/17/2020: - Patient has not taken Lisinopril since initial prescription in March 2022.  - Today in office blood pressure normal.  - Will hold blood pressure medication for now.  - Counseled to follow-up with primary provider should blood pressures begin to increase. Patient verbalized understanding.   01/16/2021: Not checking blood pressures at home. No issues/concerns.  2. LACERATION OF RIGHT LOWER LEG: Visit 05/02/2019 at [facility] at [facility] per PA note: Discussed case with Dr. Leonides Grills.  Laceration site is clean and dry, sutures intact, no signs of cellulitis/drainage.  Patient's leg is cool to touch distal of laceration site, though with good pulses. ?  Tight dressing causing symptoms.  Discussed shooting pain could be due to nerve involvement from laceration or inflammation.  At this time, will treat symptomatically with Toradol injection, diclofenac, ice compress.  Will provide CAM Walker for further symptomatic relief.  Patient to follow-up with sports medicine/orthopedics for further evaluation of foot numbness/cold extremity.  Return precautions given.  Patient expresses understanding and agrees to plan.   07/17/2020: Concern for ongoing pain at the same site even with light touch. She would like referral to see if there is something more going on.  01/16/2021: Reports referral was in [place], [place] and prefers [place] office.   3. EAR INFECTION FOLLOW-UP: 01/03/2021 at [facility] per PA note: Left ear infection  Right arm pain Amoxicillin  Diclofenac tablets   01/16/2021: Left ear pain ongoing. Has not taken antibiotics as prescribed  stating does not have time to do so with busy work schedule.  4. DEPRESSION: 01/15/2021 - 01/16/2021 at [facility] per PA note: Based on my evaluation the patient does not appear to have an emergency medical condition and can be discharged with resources and follow up care in outpatient services for Medication Management, Individual Therapy, and Group Therapy   Patient denies SI on exam.  She reports that she last experienced SI earlier this evening which was passive SI prior to calling the police to have them bring her to [facility].  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  She denies having any active suicidal intent or plan over the past few months and she states that her suicidal ideation has been passive. Patient denies HI or AVH.  Patient is not actively psychotic on exam.  Patient verbally contracts for safety with this provider. Based on this information above, patient is not an imminent risk/danger to herself or others at this time, patient does not meet inpatient psychiatric treatment criteria at this time, and I believe it is safe for patient to be discharged with follow-up recommendations and safety planning in place (see details below).    Patient not currently taking any psychotropic medications, seeing a psychiatrist, or seeing a therapist.  Patient does have hesitancy with initiating psychotropic medications at this time.  I discussed initiating psychotropic medications with the patient, including pros and cons of psychotropic medications, and patient declined starting psychotropic medications at this time.  I recommended that the patient begin seeing an outpatient therapist in order to establish a therapeutic rapport to receive support for working on her mental  health issues.  Patient has an appointment with her PCP tomorrow on 01/16/2021.  I recommend that the patient attend his 01/16/2021 appointment and discuss the pros and cons of psychotropic  medications further with her PCP at this appointment on 01/16/2021 and I recommend that patient begin seeing a psychiatric provider in the outpatient setting to discuss potential psychotropic medication initiation in the future if patient does not want to initiate psychotropic medications with her PCP after discussion with her PCP on 01/16/2021.  Patient reports she has [employer].  Resource sheet provided containing contact information for multiple outpatient [facility] psychiatry and therapy facilities for the patient to utilize to schedule outpatient psychiatry and therapy appointments.   Safety planning done at length with the patient regarding appropriate actions to take/resources to utilize [facility], nearest ED, 911, suicide prevention Lifeline) if the patient becomes suicidal or homicidal, if the patient's condition rapidly deteriorates/worsen/does not improve, or if the patient begins to experience a mental health crisis.   Patient verbalizes understanding and agreement of the overall plan and recommendations, including safety plan.   All patient's questions answered and concerns addressed.   Patient discharged home.  GTD to transport the patient home.  01/16/2021: Patient denies active thoughts of suicide, homicide, or self-harm. Plans to follow-up with Psychiatry for medication management. Thinking of returning to [facility]. Reports was there earlier today and felt uncomfortable. Reports she was left in a room alone with her thoughts. Reports they offered her medication and to allow her to sleep through the night. Reports they were taking too long to bring the medication so she decided to leave.   Patient Active Problem List   Diagnosis Date Noted   Essential hypertension 05/07/2020   Anxiety and depression 05/07/2020     Current Outpatient Medications on File Prior to Visit  Medication Sig Dispense Refill   albuterol (PROVENTIL) (2.5 MG/3ML)  0.083% nebulizer solution Take 3 mLs (2.5 mg total) by nebulization every 6 (six) hours as needed for wheezing or shortness of breath. 150 mL 1   albuterol (VENTOLIN HFA) 108 (90 Base) MCG/ACT inhaler Inhale 2 puffs into the lungs every 6 (six) hours as needed for wheezing or shortness of breath. 8 g 1   amoxicillin (AMOXIL) 500 MG capsule Take 1 capsule (500 mg total) by mouth 3 (three) times daily. 30 capsule 0   Cetirizine HCl 10 MG CAPS Take 1 capsule (10 mg total) by mouth daily. 30 capsule 0   cyclobenzaprine (FLEXERIL) 10 MG tablet Take 1 tablet (10 mg total) by mouth 2 (two) times daily as needed for muscle spasms. 20 tablet 0   diclofenac (VOLTAREN) 75 MG EC tablet Take 1 tablet (75 mg total) by mouth 2 (two) times daily. 20 tablet 0   hydrocortisone (ANUSOL-HC) 2.5 % rectal cream Place 1 application rectally 2 (two) times daily. 84 g 0   naproxen (NAPROSYN) 500 MG tablet Take 1 tablet (500 mg total) by mouth 2 (two) times daily. 30 tablet 0   ondansetron (ZOFRAN-ODT) 8 MG disintegrating tablet Take 1 tablet (8 mg total) by mouth every 8 (eight) hours as needed for nausea or vomiting. 20 tablet 0   Spacer/Aero-Holding Chambers (AEROCHAMBER PLUS) inhaler Use as instructed 1 each 2   [DISCONTINUED] dicyclomine (BENTYL) 20 MG tablet Take 1 tablet (20 mg total) by mouth 4 (four) times daily -  before meals and at bedtime. (Patient not taking: Reported on 07/17/2020) 20 tablet 0   [DISCONTINUED]  famotidine (PEPCID) 40 MG tablet Take 1 tablet (40 mg total) by mouth daily. (Patient not taking: Reported on 07/17/2020) 20 tablet 0   [DISCONTINUED] lisinopril (ZESTRIL) 5 MG tablet Take 1 tablet (5 mg total) by mouth daily. (Patient not taking: Reported on 07/17/2020) 30 tablet 1   [DISCONTINUED] sertraline (ZOLOFT) 25 MG tablet Take 1 tablet (25 mg total) by mouth at bedtime. (Patient not taking: Reported on 07/17/2020) 30 tablet 0   No current facility-administered medications on file prior to visit.    No  Known Allergies  Social History   Socioeconomic History   Marital status: Single    Spouse name: Not on file   Number of children: Not on file   Years of education: Not on file   Highest education level: Not on file  Occupational History   Not on file  Tobacco Use   Smoking status: Never   Smokeless tobacco: Never  Vaping Use   Vaping Use: Former  Substance and Sexual Activity   Alcohol use: Yes    Alcohol/week: 1.0 standard drink    Types: 1 Glasses of wine per week    Comment: occassionally   Drug use: Yes    Types: Marijuana   Sexual activity: Not on file  Other Topics Concern   Not on file  Social History Narrative   Not on file   Social Determinants of Health   Financial Resource Strain: Not on file  Food Insecurity: Not on file  Transportation Needs: Not on file  Physical Activity: Not on file  Stress: Not on file  Social Connections: Not on file  Intimate Partner Violence: Not on file    Family History  Problem Relation Age of Onset   Diabetes Mother    Diabetes Father     Past Surgical History:  Procedure Laterality Date   CARPAL TUNNEL RELEASE Right    TUBAL LIGATION     tubes tied      ROS: Review of Systems Negative except as stated above  PHYSICAL EXAM: BP 132/86 (BP Location: Left Arm, Patient Position: Sitting, Cuff Size: Large)   Pulse 73   Temp 98.3 F (36.8 C)   Resp 18   Ht 5' 1.34" (1.558 m)   Wt 146 lb 12.8 oz (66.6 kg)   SpO2 98%   BMI 27.43 kg/m   Physical Exam HENT:     Head: Normocephalic and atraumatic.  Eyes:     Extraocular Movements: Extraocular movements intact.     Conjunctiva/sclera: Conjunctivae normal.     Pupils: Pupils are equal, round, and reactive to light.  Cardiovascular:     Rate and Rhythm: Normal rate and regular rhythm.     Pulses: Normal pulses.     Heart sounds: Normal heart sounds.  Pulmonary:     Effort: Pulmonary effort is normal.     Breath sounds: Normal breath sounds.   Musculoskeletal:     Cervical back: Normal range of motion and neck supple.  Neurological:     General: No focal deficit present.     Mental Status: She is alert and oriented to person, place, and time.  Psychiatric:        Mood and Affect: Mood is depressed.    ASSESSMENT AND PLAN: 1. Essential hypertension: - Blood pressure at goal today in office.  - Continue with no pharmacological management as of present.  - Counseled on blood pressure goal of less than 130/80, low-sodium, DASH diet, medication compliance, 150 minutes of moderate  intensity exercise per week as tolerated. Discussed medication compliance, adverse effects. - Follow-up with primary provider in 3 months or sooner if needed.  2. Laceration of right lower leg, subsequent encounter: - Referral to Sports Medicine for further evaluation and management.  - Ambulatory referral to Sports Medicine  3. Left non-suppurative otitis media: - Counseled to complete course of Amoxicillin from previous Urgent Care appointment. - Follow-up with primary provider as scheduled.   4. Moderate episode of recurrent major depressive disorder [facility]): - Patient denies thoughts of self-harm, suicidal ideations, homicidal ideations. - Discussed possible medications for depression. Patient intends to follow-up with Psychiatry. - Keep all scheduled appointments with Psychiatry.  - Counseled to utilized [facility] as resource pending appointment with Psychiatry.  - Follow-up with primary provider as scheduled. - Ambulatory referral to Psychiatry   Patient was given the opportunity to ask questions.  Patient verbalized understanding of the plan and was able to repeat key elements of the plan. Patient was given clear instructions to go to Emergency Department or return to medical center if symptoms don't improve, worsen, or new problems develop.The patient verbalized understanding.   Orders Placed This Encounter   Procedures   Ambulatory referral to Sports Medicine    Return in about 3 months (around 04/16/2021) for Follow-Up or next available hypertension .  Rema Fendt, NP

## 2021-01-15 ENCOUNTER — Ambulatory Visit (HOSPITAL_COMMUNITY)
Admission: EM | Admit: 2021-01-15 | Discharge: 2021-01-16 | Disposition: A | Discharge status: Home / Self Care | Payer: BC Managed Care – PPO

## 2021-01-15 DIAGNOSIS — F331 Major depressive disorder, recurrent, moderate: Secondary | ICD-10-CM | POA: Diagnosis not present

## 2021-01-15 NOTE — ED Provider Notes (Signed)
Behavioral Health Urgent Care Medical Screening Exam  Patient Name: Amy Guerrero MRN: [id number] Date of Evaluation: 01/15/21 Chief Complaint:  Depression, Anxiety  Diagnosis:  Final diagnoses:  MDD (major depressive disorder), recurrent episode, moderate (HCC)    History of Present illness: Amy Guerrero is a 40 y.o. female with past psychiatric history significant for depression and anxiety, as well as documented past medical history significant for essential hypertension, who presents to the [facility] behavioral health urgent care [facility]) as a voluntary walk-in via law enforcement for worsening symptoms of depression and anxiety.  Patient reports that she called the police earlier this evening on 01/15/2021 because she was experiencing passive suicidal ideation and "didn't want to harm myself".  Patient denies having active suicidal ideation, suicidal intent, or suicidal plan at the time of experiencing her passive SI earlier this evening and contacting police earlier this evening.  Patient reports that she has history of depression and anxiety and she states that her depression and anxiety has gotten worse over the past few months.  She states that she has relationship and family issues that are contributing to her depressive symptoms and anxiety.  Patient states that her main stressor regarding her mental health symptoms is her current occupation at [employer], in which patient states that she works 6 days/week and is feeling overworked and very stressed out with this occupation.    Patient denies SI on exam.  She reports that she last experienced SI earlier this evening which was passive SI prior to calling the police to have them bring her to [facility].  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  She denies having any active suicidal intent or plan over the past few months and she states that her suicidal ideation has been passive.  She reports history of 2 past suicide attempts,  one that occurred when she was a child and one that occurred 22 years ago at the age of 40 years old, although patient states she is unable to recall the details of what the suicide attempts consisted of at those times.  Patient denies any history of self-injurious behavior via intentionally cutting or burning herself.  She denies homicidal ideations or AVH. Patient describes her sleep as poor, stating that she will often fall asleep around 11 PM/midnight, then wake up at 3 AM, and then not be able to fall back asleep until about 6 or 7 AM.  She endorses feelings of anhedonia and guilt, hopelessness, and worthlessness over the past 2 to 3 months.  She also endorses declines and energy over the past few months.  She denies concentration changes over the past few months.  She denies appetite changes over the past few months but does endorse fluctuating weight changes although she is unable to recall specific weight loss/gain.  Patient denies taking any psychotropic medications at this time.  She denies ever taking psychotropic medications in the past.  She reports that she is hesitant to try psychotropic medications because she is scared of the potential side effect profile she may experience while on these medications.  She denies having a psychiatrist or therapist at this time in the outpatient setting.  She reports that she was psychiatrically hospitalized at the age of 28 and at the age of 69 (patient states that the hospitalization at the age of 23 was in [place] that she does not provide details regarding where she was hospitalized at the age of 55).  Patient does state that  she would like to begin seeing an outpatient psychiatrist and therapist.   Patient lives in [place] with her boyfriend.  She reports that her boyfriend moved into their new home 1 month ago and she states that prior to this, her and her boyfriend were homeless for about 9 months.  Patient reports that she has relationship issues with  her boyfriend, but she denies being a victim of physical, verbal, or sexual abuse by her boyfriend.  Patient denies access to firearms.  Patient endorses drinking alcohol on rare occasion but does not provide specific details regarding frequency of consumption.  She reports that she last drank an unknown amount of alcohol 1 to 2 weeks ago.  Patient denies any tobacco/nicotine or illicit substance use.  Patient is currently employed at [employer].   Patient declined for this provider to obtain collateral contact.  Patient verbally contracts for safety with this Clinical research associate and states that if she were to return home this evening, she will not try to harm herself in any way.  Psychiatric Specialty Exam  Presentation  General Appearance:Appropriate for Environment; Well Groomed  Eye Contact:Good  Speech:Clear and Coherent; Normal Rate  Speech Volume:Normal  Handedness:No data recorded  Mood and Affect  Mood:Depressed; Anxious  Affect:Congruent; Tearful   Thought Process  Thought Processes:Coherent; Goal Directed; Linear  Descriptions of Associations:Intact  Orientation:Full (Time, Place and Person)  Thought Content:WDL; Logical    Hallucinations:None  Ideas of Reference:None  Suicidal Thoughts:No  Homicidal Thoughts:No   Sensorium  Memory:Immediate Fair; Recent Fair; Remote Fair  Judgment:Fair  Insight:Fair   Executive Functions  Concentration:Good  Attention Span:Good  Recall:Fair  Fund of Knowledge:Good  Language:Good   Psychomotor Activity  Psychomotor Activity:Normal   Assets  Assets:Communication Skills; Desire for Improvement; Financial Resources/Insurance; Housing; Leisure Time; Physical Health; Resilience; Social Support; Vocational/Educational   Sleep  Sleep:Poor  Number of hours: No data recorded  No data recorded  Physical Exam: Physical Exam Vitals reviewed.  Constitutional:      General: She is not in acute distress.    Appearance: She is  not ill-appearing, toxic-appearing or diaphoretic.  HENT:     Head: Normocephalic and atraumatic.     Right Ear: External ear normal.     Left Ear: External ear normal.     Nose: Nose normal.  Eyes:     General:        Right eye: No discharge.        Left eye: No discharge.     Conjunctiva/sclera: Conjunctivae normal.  Cardiovascular:     Rate and Rhythm: Normal rate.  Pulmonary:     Effort: Pulmonary effort is normal. No respiratory distress.  Musculoskeletal:        General: Normal range of motion.     Cervical back: Normal range of motion.  Neurological:     General: No focal deficit present.     Mental Status: She is alert and oriented to person, place, and time.     Comments: No tremor noted.   Psychiatric:        Attention and Perception: Attention and perception normal. She does not perceive auditory or visual hallucinations.        Mood and Affect: Mood is anxious and depressed.        Speech: Speech normal.        Behavior: Behavior is not agitated, slowed, aggressive, withdrawn, hyperactive or combative. Behavior is cooperative.        Thought Content: Thought content is not paranoid  or delusional. Thought content does not include homicidal or suicidal ideation.     Comments: Affect mood congruent and tearful.    Review of Systems  Constitutional:  Positive for malaise/fatigue and weight loss. Negative for chills, diaphoresis and fever.  HENT:  Negative for congestion.   Respiratory:  Negative for cough and shortness of breath.   Cardiovascular:  Negative for chest pain and palpitations.  Gastrointestinal:  Negative for abdominal pain, constipation, diarrhea, nausea and vomiting.  Musculoskeletal:  Negative for joint pain and myalgias.  Neurological:  Negative for dizziness and headaches.  Psychiatric/Behavioral:  Positive for depression. Negative for hallucinations, memory loss and substance abuse. The patient is nervous/anxious and has insomnia.        + for passive  SI.   All other systems reviewed and are negative.  Vitals: Blood pressure (!) 138/92, pulse 69, temperature 98.7 F (37.1 C), temperature source Oral, resp. rate 18, SpO2 100 %. There is no height or weight on file to calculate BMI.  Musculoskeletal: Strength & Muscle Tone: within normal limits Gait & Station: normal Patient leans: N/A   [facility] MSE Discharge Disposition for Follow up and Recommendations: Based on my evaluation the patient does not appear to have an emergency medical condition and can be discharged with resources and follow up care in outpatient services for Medication Management, Individual Therapy, and Group Therapy  Patient denies SI on exam.  She reports that she last experienced SI earlier this evening which was passive SI prior to calling the police to have them bring her to [facility].  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  She denies having any active suicidal intent or plan over the past few months and she states that her suicidal ideation has been passive. Patient denies HI or AVH.  Patient is not actively psychotic on exam.  Patient verbally contracts for safety with this provider. Based on this information above, patient is not an imminent risk/danger to herself or others at this time, patient does not meet inpatient psychiatric treatment criteria at this time, and I believe it is safe for patient to be discharged with follow-up recommendations and safety planning in place (see details below).   Patient not currently taking any psychotropic medications, seeing a psychiatrist, or seeing a therapist.  Patient does have hesitancy with initiating psychotropic medications at this time.  I discussed initiating psychotropic medications with the patient, including pros and cons of psychotropic medications, and patient declined starting psychotropic medications at this time.  I recommended that the patient begin seeing an outpatient therapist in order to  establish a therapeutic rapport to receive support for working on her mental health issues.  Patient has an appointment with her PCP tomorrow on 01/16/2021.  I recommend that the patient attend his 01/16/2021 appointment and discuss the pros and cons of psychotropic medications further with her PCP at this appointment on 01/16/2021 and I recommend that patient begin seeing a psychiatric provider in the outpatient setting to discuss potential psychotropic medication initiation in the future if patient does not want to initiate psychotropic medications with her PCP after discussion with her PCP on 01/16/2021.  Patient reports she has [employer].  Resource sheet provided containing contact information for multiple outpatient [facility] psychiatry and therapy facilities for the patient to utilize to schedule outpatient psychiatry and therapy appointments.  Safety planning done at length with the patient regarding appropriate actions to take/resources to utilize [facility], nearest ED, 911,  suicide prevention Lifeline) if the patient becomes suicidal or homicidal, if the patient's condition rapidly deteriorates/worsen/does not improve, or if the patient begins to experience a mental health crisis.  Patient verbalizes understanding and agreement of the overall plan and recommendations, including safety plan.  All patient's questions answered and concerns addressed.  Patient discharged home.  GTD to transport the patient home.   Jaclyn Shaggy, PA-C 01/15/2021, 11:40 PM

## 2021-01-15 NOTE — Discharge Instructions (Signed)
  Discharge recommendations:  Patient is to take medications as prescribed. Please see information for follow-up appointment with psychiatry and therapy. Please follow up with your primary care provider for all medical related needs.   Therapy: We recommend that patient participate in individual therapy to address mental health concerns.  Medications: The patient is to contact a medical professional and/or outpatient provider to address any new side effects that develop. Patient should update outpatient providers of any new medications and/or medication changes.   Safety:  The patient should abstain from use of illicit substances/drugs and abuse of any medications. If symptoms worsen or do not continue to improve or if the patient becomes actively suicidal or homicidal then it is recommended that the patient return to the closest hospital emergency department, the [facility], or call 911 for further evaluation and treatment. National Suicide Prevention Lifeline 1-800-SUICIDE or 1-800-273-8255.  About 988 988 offers 24/7 access to trained crisis counselors who can help people experiencing mental health-related distress. People can call or text 988 or chat 988lifeline.org for themselves or if they are worried about a loved one who may need crisis support.  

## 2021-01-15 NOTE — Progress Notes (Signed)
Pt arrived via [employer] after calling them saying she needed to talk to someone.  She was initially reluctant to answer any assessment questions due to she did not want to relinquish her cell phone and purse while she was being assessed.  She decided to stay and talk to a provider.  She denied suicidal ideation upon arrival and denied auditory and visual hallucinations.  She stated, "I just don't like being alone with my thoughts sometimes."  She further reported that she has a stressful job, works 6 days every week and has a hard time sleeping continuously.  She can fall asleep but has a hard time staying asleep.  She voiced appreciation for being seen at this facility.  She reported that she lives with her boyfriend and has to be at work in the morning at 0900 hours.  She has an appointment with her primary care physician on Thursday.

## 2021-01-16 ENCOUNTER — Encounter: Payer: Self-pay | Admitting: Family

## 2021-01-16 ENCOUNTER — Ambulatory Visit (INDEPENDENT_AMBULATORY_CARE_PROVIDER_SITE_OTHER): Payer: BC Managed Care – PPO | Admitting: Family

## 2021-01-16 ENCOUNTER — Other Ambulatory Visit: Payer: Self-pay

## 2021-01-16 VITALS — BP 132/86 | HR 73 | Temp 98.3°F | Resp 18 | Ht 61.34 in | Wt 146.8 lb

## 2021-01-16 DIAGNOSIS — H6592 Unspecified nonsuppurative otitis media, left ear: Secondary | ICD-10-CM | POA: Diagnosis not present

## 2021-01-16 DIAGNOSIS — I10 Essential (primary) hypertension: Secondary | ICD-10-CM | POA: Diagnosis not present

## 2021-01-16 DIAGNOSIS — F331 Major depressive disorder, recurrent, moderate: Secondary | ICD-10-CM

## 2021-01-16 DIAGNOSIS — S81811D Laceration without foreign body, right lower leg, subsequent encounter: Secondary | ICD-10-CM | POA: Diagnosis not present

## 2021-01-16 NOTE — Progress Notes (Signed)
Pt presents for hypertension follow-up,  Needs new referral for prev leg laceration pt states feel like nerve damage

## 2021-01-16 NOTE — Progress Notes (Signed)
TRIAGE: ROUTINE  Melbourne Abts, PA-C, reviewed pt's chart and information and spoke with pt face-to-face and determined she can be psych cleared.   01/15/21 2009  [facility] Triage Screening (Walk-ins at [facility] only)  How Did You Hear About [place]? Legal System  What Is the Reason for Your Visit/Call Today? Pt was allegedly brought to the [facility] due to calling the police due to SI. They report she was upset and crying when they responded so she was brought to the [facility]. Pt refuses to answer questions, stating she was depressed and wanted to leave. Pt denied she had been using EtOH or other substances. Pt talked about her job and how busy it is but continued to refuse to answer questions.  How Long Has This Been Causing You Problems?  (UTA)  Have You Recently Had Any Thoughts About Hurting Yourself? Yes  How long ago did you have thoughts about hurting yourself? UTA  Are You Planning to Commit Suicide/Harm Yourself At This time?  (UTA)  Have you Recently Had Thoughts About Hurting Someone Else?  (UTA)  Are You Planning To Harm Someone At This Time?  (UTA)  Are you currently experiencing any auditory, visual or other hallucinations?  (UTA)  Have You Used Any Alcohol or Drugs in the Past 24 Hours?  (UTA)  Do you have any current medical co-morbidities that require immediate attention?  (UTA)  Clinician description of patient physical appearance/behavior: Pt is casually dressed in multiple layers. She is pacing in the hallway and refuses to sit down/enter the assessment room.  What Do You Feel Would Help You the Most Today?  (UTA)  If access to [facility] was not available, would you have sought care in the Emergency Department?  (UTA)  Determination of Need Routine (7 days)  Options For Referral Outpatient Therapy

## 2021-01-20 ENCOUNTER — Telehealth (HOSPITAL_COMMUNITY): Payer: Self-pay | Admitting: Family

## 2021-01-20 NOTE — BH Assessment (Signed)
Care Management - Follow Up Discharges   Writer made contact with the patient.  Patient reports that she is not able to come to [facility] due to her work schedule.  Patient is in the process of scheduling an appt with a therapist and a psychiatrist that accepts her insurance.

## 2021-01-22 ENCOUNTER — Ambulatory Visit: Payer: Self-pay

## 2021-01-22 ENCOUNTER — Ambulatory Visit (INDEPENDENT_AMBULATORY_CARE_PROVIDER_SITE_OTHER): Payer: BC Managed Care – PPO | Admitting: Family Medicine

## 2021-01-22 ENCOUNTER — Encounter: Payer: Self-pay | Admitting: Family Medicine

## 2021-01-22 VITALS — Ht 61.0 in | Wt 148.0 lb

## 2021-01-22 DIAGNOSIS — M79661 Pain in right lower leg: Secondary | ICD-10-CM | POA: Diagnosis not present

## 2021-01-22 MED ORDER — MELOXICAM 15 MG PO TABS: 15.0000 mg | ORAL_TABLET | Freq: Every day | ORAL | 2 refills | Status: DC

## 2021-01-22 NOTE — Patient Instructions (Addendum)
We will set you up to see neurology and likely have the nerve test done to assess function of the nerves in the area that may have been injured. They will call you to schedule an appt. Please let us know if you haven't heard from them by next week.  [facility] [address], [place], [place] [address] Phone: [phone]  This will give [place] good information on to what extent this can be fixed vs what may be permanent. Start home exercises with theraband and calf raises - work your way up to 3 sets of 10 once a day. Meloxicam 15mg  daily with food for pain and inflammation - don't take aleve or ibuprofen while on this. Ok to take tylenol with this. There are some nerve blocking medicines like gabapentin and nortriptyline we can consider but these don't fix anything and only help with symptoms.

## 2021-01-22 NOTE — Addendum Note (Signed)
Addended by: Rutha Bouchard E on: 01/22/2021 02:48 PM   Modules accepted: Orders

## 2021-01-22 NOTE — Progress Notes (Signed)
PCP: Rema Fendt, NP  Subjective:   HPI: Patient is a 40 y.o. female here for right lower extremity numbness.  Patient reports about 2 years ago she was cut by a piece of glass on posterior aspect of right lower leg. She has continued to have numbness and tingling locally that radiates into her foot on the dorsal aspect. Initially after this injury she was in a boot and on crutches. She continues to also have pain in the distribution noted above. Radiographs at time of injury were negative for foreign body - had tetanus updated and sutures placed.  Past Medical History:  Diagnosis Date   Asthma    Sinusitis     Current Outpatient Medications on File Prior to Visit  Medication Sig Dispense Refill   albuterol (PROVENTIL) (2.5 MG/3ML) 0.083% nebulizer solution Take 3 mLs (2.5 mg total) by nebulization every 6 (six) hours as needed for wheezing or shortness of breath. 150 mL 1   albuterol (VENTOLIN HFA) 108 (90 Base) MCG/ACT inhaler Inhale 2 puffs into the lungs every 6 (six) hours as needed for wheezing or shortness of breath. 8 g 1   amoxicillin (AMOXIL) 500 MG capsule Take 1 capsule (500 mg total) by mouth 3 (three) times daily. 30 capsule 0   Cetirizine HCl 10 MG CAPS Take 1 capsule (10 mg total) by mouth daily. 30 capsule 0   cyclobenzaprine (FLEXERIL) 10 MG tablet Take 1 tablet (10 mg total) by mouth 2 (two) times daily as needed for muscle spasms. 20 tablet 0   hydrocortisone (ANUSOL-HC) 2.5 % rectal cream Place 1 application rectally 2 (two) times daily. 84 g 0   ondansetron (ZOFRAN-ODT) 8 MG disintegrating tablet Take 1 tablet (8 mg total) by mouth every 8 (eight) hours as needed for nausea or vomiting. 20 tablet 0   Spacer/Aero-Holding Chambers (AEROCHAMBER PLUS) inhaler Use as instructed 1 each 2   [DISCONTINUED] dicyclomine (BENTYL) 20 MG tablet Take 1 tablet (20 mg total) by mouth 4 (four) times daily -  before meals and at bedtime. (Patient not taking: Reported on 07/17/2020)  20 tablet 0   [DISCONTINUED] famotidine (PEPCID) 40 MG tablet Take 1 tablet (40 mg total) by mouth daily. (Patient not taking: Reported on 07/17/2020) 20 tablet 0   [DISCONTINUED] lisinopril (ZESTRIL) 5 MG tablet Take 1 tablet (5 mg total) by mouth daily. (Patient not taking: Reported on 07/17/2020) 30 tablet 1   [DISCONTINUED] sertraline (ZOLOFT) 25 MG tablet Take 1 tablet (25 mg total) by mouth at bedtime. (Patient not taking: Reported on 07/17/2020) 30 tablet 0   No current facility-administered medications on file prior to visit.    Past Surgical History:  Procedure Laterality Date   CARPAL TUNNEL RELEASE Right    TUBAL LIGATION     tubes tied      No Known Allergies  Ht 5\' 1"  (1.549 m)   Wt 148 lb (67.1 kg)   BMI 27.96 kg/m   [facility] 01/22/2021  Frequency of aerobic exercise (# of days/week) 2  Average time in minutes > 90  Frequency of strengthening activities (# of days/week) 6    No flowsheet data found.      Objective:  Physical Exam:  Gen: NAD, comfortable in exam room  Right lower leg/ankle: Well healed laceration posterior lower leg just distal to musculotendinous junction area of achilles. No swelling, bruising, other deformity. FROM ankle but 4/5 strength IR, ER, plantarflexion.  Difficulty doing single leg calf raise. TTP  over healed laceration and distal to this Thompsons test negative. Sensation diminished distal to healed laceration posterior lower leg. Normal cap refill.  2+ pt pulse.   Limited MSK u/s right lower leg: Scar tissue noted superficial to achilles in area of laceration - no obvious involvement of achilles or soleus.  Peroneal tendons intact as well.   Assessment & Plan:  1. Right lower extremity neuropathic pain - continues to have symptoms posterior lower leg into dorsal foot.  Suspect with her laceration she had transection of her sural nerve as well but she has some weakness of muscles of her lower leg as well.   Likely due to disuse but will evaluate with NCV/EMGs.  Home exercise program provided.  Meloxicam as needed.  Consider gabapentin, nortriptyline in future.

## 2021-01-29 ENCOUNTER — Ambulatory Visit
Admission: EM | Admit: 2021-01-29 | Discharge: 2021-01-29 | Disposition: A | Discharge status: Home / Self Care | Payer: BC Managed Care – PPO | Attending: Physician Assistant | Admitting: Physician Assistant

## 2021-01-29 ENCOUNTER — Other Ambulatory Visit: Payer: Self-pay

## 2021-01-29 ENCOUNTER — Encounter: Payer: Self-pay | Admitting: Emergency Medicine

## 2021-01-29 DIAGNOSIS — M79641 Pain in right hand: Secondary | ICD-10-CM | POA: Diagnosis not present

## 2021-01-29 MED ORDER — PREDNISONE 20 MG PO TABS: 40.0000 mg | ORAL_TABLET | Freq: Every day | ORAL | 0 refills | Status: AC

## 2021-01-29 NOTE — ED Triage Notes (Signed)
C/o pain and swelling to right hand. Works lifting heavy boxes. Hx of carpal tunnel surgery in left arm, believes she needs it now in the right one

## 2021-01-30 NOTE — ED Provider Notes (Signed)
[name]    CSN: [id number] Arrival date & time: 01/29/21  1840      History   Chief Complaint Chief Complaint  Patient presents with   Hand Pain    HPI Amy Guerrero is a 40 y.o. female.   Patient here today for evaluation of pain and swelling in her right hand.  She states that she lifts heavy boxes at work and is not sure if this is a contributing factor but did have surgery for carpal tunnel on her left wrist in the past and states some of her symptoms are similar in her right hand and wrist.  She states that she will have pain radiate from her right hand into her right forearm at times.  She has not seen orthopedist recently for symptoms however has been seen in urgent care for same in the recent past.  The history is provided by the patient.  Hand Pain Pertinent negatives include no abdominal pain and no shortness of breath.   Past Medical History:  Diagnosis Date   Asthma    Sinusitis     Patient Active Problem List   Diagnosis Date Noted   Essential hypertension 05/07/2020   Anxiety and depression 05/07/2020    Past Surgical History:  Procedure Laterality Date   CARPAL TUNNEL RELEASE Right    TUBAL LIGATION     tubes tied      OB History   No obstetric history on file.      Home Medications    Prior to Admission medications   Medication Sig Start Date End Date Taking? Authorizing Provider  predniSONE (DELTASONE) 20 MG tablet Take 2 tablets (40 mg total) by mouth daily with breakfast for 5 days. 01/29/21 02/03/21 Yes Tomi Bamberger, PA-C  albuterol (PROVENTIL) (2.5 MG/3ML) 0.083% nebulizer solution Take 3 mLs (2.5 mg total) by nebulization every 6 (six) hours as needed for wheezing or shortness of breath. 09/22/20   Wallis Bamberg, PA-C  albuterol (VENTOLIN HFA) 108 (90 Base) MCG/ACT inhaler Inhale 2 puffs into the lungs every 6 (six) hours as needed for wheezing or shortness of breath. 09/22/20   Wallis Bamberg, PA-C  amoxicillin (AMOXIL)  500 MG capsule Take 1 capsule (500 mg total) by mouth 3 (three) times daily. 01/03/21   Elson Areas, PA-C  Cetirizine HCl 10 MG CAPS Take 1 capsule (10 mg total) by mouth daily. 04/22/20 05/22/20  Bing Neighbors, FNP  cyclobenzaprine (FLEXERIL) 10 MG tablet Take 1 tablet (10 mg total) by mouth 2 (two) times daily as needed for muscle spasms. 11/25/20   Tomi Bamberger, PA-C  hydrocortisone (ANUSOL-HC) 2.5 % rectal cream Place 1 application rectally 2 (two) times daily. 07/17/20   Rema Fendt, NP  meloxicam (MOBIC) 15 MG tablet Take 1 tablet (15 mg total) by mouth daily. 01/22/21   Hudnall, Azucena Fallen, MD  ondansetron (ZOFRAN-ODT) 8 MG disintegrating tablet Take 1 tablet (8 mg total) by mouth every 8 (eight) hours as needed for nausea or vomiting. 09/22/20   Wallis Bamberg, PA-C  Spacer/Aero-Holding Chambers (AEROCHAMBER PLUS) inhaler Use as instructed 05/07/20   Rema Fendt, NP  dicyclomine (BENTYL) 20 MG tablet Take 1 tablet (20 mg total) by mouth 4 (four) times daily -  before meals and at bedtime. Patient not taking: Reported on 07/17/2020 06/18/20 07/22/20  Wieters, Hallie C, PA-C  famotidine (PEPCID) 40 MG tablet Take 1 tablet (40 mg total) by mouth daily. Patient not taking: Reported on 07/17/2020  06/18/20 07/22/20  Wieters, Hallie C, PA-C  lisinopril (ZESTRIL) 5 MG tablet Take 1 tablet (5 mg total) by mouth daily. Patient not taking: Reported on 07/17/2020 05/07/20 07/22/20  Rema Fendt, NP  sertraline (ZOLOFT) 25 MG tablet Take 1 tablet (25 mg total) by mouth at bedtime. Patient not taking: Reported on 07/17/2020 05/07/20 07/22/20  Rema Fendt, NP    Family History Family History  Problem Relation Age of Onset   Diabetes Mother    Diabetes Father     Social History Social History   Tobacco Use   Smoking status: Never   Smokeless tobacco: Never  Vaping Use   Vaping Use: Former  Substance Use Topics   Alcohol use: Yes    Alcohol/week: 1.0 standard drink    Types: 1 Glasses of wine per  week    Comment: occassionally   Drug use: Yes    Types: Marijuana     Allergies   Patient has no known allergies.   Review of Systems Review of Systems  Constitutional:  Negative for chills and fever.  Eyes:  Negative for discharge and redness.  Respiratory:  Negative for shortness of breath.   Gastrointestinal:  Negative for abdominal pain, nausea and vomiting.  Genitourinary:  Positive for vaginal bleeding and vaginal discharge.  Musculoskeletal:  Positive for arthralgias. Negative for joint swelling.  Skin:  Negative for color change.  Neurological:  Positive for numbness (occasional numbness and tingling to right and left fingers).    Physical Exam Triage Vital Signs ED Triage Vitals [01/29/21 1918]  Enc Vitals Group     BP 125/83     Pulse Rate 82     Resp 16     Temp 98.3 F (36.8 C)     Temp Source Oral     SpO2 98 %     Weight      Height      Head Circumference      Peak Flow      Pain Score 8     Pain Loc      Pain Edu?      Excl. in GC?    No data found.  Updated Vital Signs BP 125/83 (BP Location: Left Arm)    Pulse 82    Temp 98.3 F (36.8 C) (Oral)    Resp 16    SpO2 98%    Physical Exam Vitals and nursing note reviewed.  Constitutional:      General: She is not in acute distress.    Appearance: Normal appearance. She is not ill-appearing.  HENT:     Head: Normocephalic and atraumatic.  Eyes:     Conjunctiva/sclera: Conjunctivae normal.  Cardiovascular:     Rate and Rhythm: Normal rate.  Pulmonary:     Effort: Pulmonary effort is normal.  Musculoskeletal:     Comments: Full ROM of right wrist and fingers, no significant swelling appreciated  Neurological:     Mental Status: She is alert.  Psychiatric:        Mood and Affect: Mood normal.        Behavior: Behavior normal.        Thought Content: Thought content normal.     UC Treatments / Results  Labs (all labs ordered are listed, but only abnormal results are displayed) Labs  Reviewed - No data to display  EKG   Radiology No results found.  Procedures Procedures (including critical care time)  Medications Ordered in UC Medications - No  data to display  Initial Impression / Assessment and Plan / UC Course  I have reviewed the triage vital signs and the nursing notes.  Pertinent labs & imaging results that were available during my care of the patient were reviewed by me and considered in my medical decision making (see chart for details).    We will trial prednisone for hopeful symptom relief but recommended ultimately she would need follow-up with Ortho.  Patient expresses understanding.   Final Clinical Impressions(s) / UC Diagnoses   Final diagnoses:  Right hand pain   Discharge Instructions   None    ED Prescriptions     Medication Sig Dispense Auth. Provider   predniSONE (DELTASONE) 20 MG tablet Take 2 tablets (40 mg total) by mouth daily with breakfast for 5 days. 10 tablet Tomi Bamberger, PA-C      PDMP not reviewed this encounter.   Tomi Bamberger, PA-C 01/30/21 [phone]

## 2021-02-03 ENCOUNTER — Ambulatory Visit
Admission: RE | Admit: 2021-02-03 | Discharge: 2021-02-03 | Disposition: A | Discharge status: Home / Self Care | Payer: BC Managed Care – PPO | Source: Ambulatory Visit | Attending: Physician Assistant | Admitting: Physician Assistant

## 2021-02-03 ENCOUNTER — Other Ambulatory Visit: Payer: Self-pay

## 2021-02-03 VITALS — BP 134/86 | HR 84 | Temp 98.9°F | Resp 18

## 2021-02-03 DIAGNOSIS — J029 Acute pharyngitis, unspecified: Secondary | ICD-10-CM

## 2021-02-03 LAB — POCT RAPID STREP A (OFFICE): Rapid Strep A Screen: NEGATIVE

## 2021-02-03 MED ORDER — METHYLPREDNISOLONE 4 MG PO TBPK: ORAL_TABLET | ORAL | 0 refills | Status: DC

## 2021-02-03 NOTE — ED Provider Notes (Signed)
[name]    CSN: [id number] Arrival date & time: 02/03/21  1501      History   Chief Complaint Chief Complaint  Patient presents with   Sore Throat    HPI Amy Guerrero is a 40 y.o. female.   Patient here today for evaluation of sore throat, congestion, left ear pain, body aches, and headache that started last night. She has not had cough. She has not had fever or chills. She denies nausea, vomiting or diarrhea. She has tried OTC meds without significant relief.   The history is provided by the patient.  Sore Throat Pertinent negatives include no abdominal pain and no shortness of breath.   Past Medical History:  Diagnosis Date   Asthma    Sinusitis     Patient Active Problem List   Diagnosis Date Noted   Essential hypertension 05/07/2020   Anxiety and depression 05/07/2020    Past Surgical History:  Procedure Laterality Date   CARPAL TUNNEL RELEASE Right    TUBAL LIGATION     tubes tied      OB History   No obstetric history on file.      Home Medications    Prior to Admission medications   Medication Sig Start Date End Date Taking? Authorizing Provider  methylPREDNISolone (MEDROL DOSEPAK) 4 MG TBPK tablet Take 6 tabs day 1, 5 tabs day 2, 4 tabs day 3, 3 tabs day 4, 2 tabs day 5, 1 tab day 6 02/03/21  Yes Tomi Bamberger, PA-C  albuterol (PROVENTIL) (2.5 MG/3ML) 0.083% nebulizer solution Take 3 mLs (2.5 mg total) by nebulization every 6 (six) hours as needed for wheezing or shortness of breath. 09/22/20   Wallis Bamberg, PA-C  albuterol (VENTOLIN HFA) 108 (90 Base) MCG/ACT inhaler Inhale 2 puffs into the lungs every 6 (six) hours as needed for wheezing or shortness of breath. 09/22/20   Wallis Bamberg, PA-C  amoxicillin (AMOXIL) 500 MG capsule Take 1 capsule (500 mg total) by mouth 3 (three) times daily. 01/03/21   Elson Areas, PA-C  Cetirizine HCl 10 MG CAPS Take 1 capsule (10 mg total) by mouth daily. 04/22/20 05/22/20  Bing Neighbors, FNP   cyclobenzaprine (FLEXERIL) 10 MG tablet Take 1 tablet (10 mg total) by mouth 2 (two) times daily as needed for muscle spasms. 11/25/20   Tomi Bamberger, PA-C  hydrocortisone (ANUSOL-HC) 2.5 % rectal cream Place 1 application rectally 2 (two) times daily. 07/17/20   Rema Fendt, NP  meloxicam (MOBIC) 15 MG tablet Take 1 tablet (15 mg total) by mouth daily. 01/22/21   Hudnall, Azucena Fallen, MD  ondansetron (ZOFRAN-ODT) 8 MG disintegrating tablet Take 1 tablet (8 mg total) by mouth every 8 (eight) hours as needed for nausea or vomiting. 09/22/20   Wallis Bamberg, PA-C  predniSONE (DELTASONE) 20 MG tablet Take 2 tablets (40 mg total) by mouth daily with breakfast for 5 days. 01/29/21 02/03/21  Tomi Bamberger, PA-C  Spacer/Aero-Holding Chambers (AEROCHAMBER PLUS) inhaler Use as instructed 05/07/20   Rema Fendt, NP  dicyclomine (BENTYL) 20 MG tablet Take 1 tablet (20 mg total) by mouth 4 (four) times daily -  before meals and at bedtime. Patient not taking: Reported on 07/17/2020 06/18/20 07/22/20  Wieters, Hallie C, PA-C  famotidine (PEPCID) 40 MG tablet Take 1 tablet (40 mg total) by mouth daily. Patient not taking: Reported on 07/17/2020 06/18/20 07/22/20  Wieters, Hallie C, PA-C  lisinopril (ZESTRIL) 5 MG tablet Take 1  tablet (5 mg total) by mouth daily. Patient not taking: Reported on 07/17/2020 05/07/20 07/22/20  Rema Fendt, NP  sertraline (ZOLOFT) 25 MG tablet Take 1 tablet (25 mg total) by mouth at bedtime. Patient not taking: Reported on 07/17/2020 05/07/20 07/22/20  Rema Fendt, NP    Family History Family History  Problem Relation Age of Onset   Diabetes Mother    Diabetes Father     Social History Social History   Tobacco Use   Smoking status: Never   Smokeless tobacco: Never  Vaping Use   Vaping Use: Former  Substance Use Topics   Alcohol use: Yes    Alcohol/week: 1.0 standard drink    Types: 1 Glasses of wine per week    Comment: occassionally   Drug use: Yes    Types: Marijuana      Allergies   Patient has no known allergies.   Review of Systems Review of Systems  Constitutional:  Negative for chills and fever.  HENT:  Positive for congestion and sore throat. Negative for ear pain.   Eyes:  Negative for discharge and redness.  Respiratory:  Negative for cough, shortness of breath and wheezing.   Gastrointestinal:  Negative for abdominal pain, diarrhea, nausea and vomiting.    Physical Exam Triage Vital Signs ED Triage Vitals [02/03/21 1550]  Enc Vitals Group     BP 134/86     Pulse Rate 84     Resp 18     Temp 98.9 F (37.2 C)     Temp Source Oral     SpO2 99 %     Weight      Height      Head Circumference      Peak Flow      Pain Score 0     Pain Loc      Pain Edu?      Excl. in GC?    No data found.  Updated Vital Signs BP 134/86 (BP Location: Right Arm)    Pulse 84    Temp 98.9 F (37.2 C) (Oral)    Resp 18    SpO2 99%   Physical Exam Vitals and nursing note reviewed.  Constitutional:      General: She is not in acute distress.    Appearance: Normal appearance. She is not ill-appearing.  HENT:     Head: Normocephalic and atraumatic.     Right Ear: Tympanic membrane normal.     Left Ear: Tympanic membrane normal.     Nose: Congestion present.     Mouth/Throat:     Mouth: Mucous membranes are moist.     Pharynx: No oropharyngeal exudate or posterior oropharyngeal erythema.  Eyes:     Conjunctiva/sclera: Conjunctivae normal.  Cardiovascular:     Rate and Rhythm: Normal rate and regular rhythm.     Heart sounds: Normal heart sounds. No murmur heard. Pulmonary:     Effort: Pulmonary effort is normal. No respiratory distress.     Breath sounds: Normal breath sounds. No wheezing, rhonchi or rales.  Skin:    General: Skin is warm and dry.  Neurological:     Mental Status: She is alert.  Psychiatric:        Mood and Affect: Mood normal.        Thought Content: Thought content normal.     UC Treatments / Results   Labs (all labs ordered are listed, but only abnormal results are displayed) Labs Reviewed  COVID-19, FLU  A+B NAA  CULTURE, GROUP A STREP [facility])  POCT RAPID STREP A (OFFICE)    EKG   Radiology No results found.  Procedures Procedures (including critical care time)  Medications Ordered in UC Medications - No data to display  Initial Impression / Assessment and Plan / UC Course  I have reviewed the triage vital signs and the nursing notes.  Pertinent labs & imaging results that were available during my care of the patient were reviewed by me and considered in my medical decision making (see chart for details).    Medrol dosepack prescribed for pharyngitis and suspected ETD. Recommend symptomatic treatment otherwise. Will screen for covid at patient's request.  Final Clinical Impressions(s) / UC Diagnoses   Final diagnoses:  Acute pharyngitis, unspecified etiology   Discharge Instructions   None    ED Prescriptions     Medication Sig Dispense Auth. Provider   methylPREDNISolone (MEDROL DOSEPAK) 4 MG TBPK tablet Take 6 tabs day 1, 5 tabs day 2, 4 tabs day 3, 3 tabs day 4, 2 tabs day 5, 1 tab day 6 21 tablet Tomi Bamberger, PA-C      PDMP not reviewed this encounter.   Tomi Bamberger, PA-C 02/03/21 1656

## 2021-02-03 NOTE — ED Triage Notes (Signed)
Pt c/o sore throat, nasal congestion with drainage, left ear pain, headache, body aches,   Denies cough, chills, nausea, vomiting, diarrhea, constipation.   Onset last night.

## 2021-02-03 NOTE — Progress Notes (Signed)
Erroneous encounter

## 2021-02-04 ENCOUNTER — Encounter: Payer: BC Managed Care – PPO | Admitting: Family

## 2021-02-04 LAB — COVID-19, FLU A+B NAA
Influenza A, NAA: NOT DETECTED
Influenza B, NAA: NOT DETECTED
SARS-CoV-2, NAA: NOT DETECTED

## 2021-02-06 LAB — CULTURE, GROUP A STREP (THRC)

## 2021-02-10 ENCOUNTER — Encounter (HOSPITAL_COMMUNITY): Payer: Self-pay | Admitting: *Deleted

## 2021-02-10 ENCOUNTER — Other Ambulatory Visit: Payer: Self-pay

## 2021-02-10 ENCOUNTER — Emergency Department (HOSPITAL_COMMUNITY): Payer: BC Managed Care – PPO

## 2021-02-10 ENCOUNTER — Emergency Department (HOSPITAL_COMMUNITY)
Admission: EM | Admit: 2021-02-10 | Discharge: 2021-02-10 | Disposition: A | Discharge status: Home / Self Care | Payer: BC Managed Care – PPO | Attending: Emergency Medicine | Admitting: Emergency Medicine

## 2021-02-10 DIAGNOSIS — Z20822 Contact with and (suspected) exposure to covid-19: Secondary | ICD-10-CM | POA: Diagnosis not present

## 2021-02-10 DIAGNOSIS — J45909 Unspecified asthma, uncomplicated: Secondary | ICD-10-CM | POA: Insufficient documentation

## 2021-02-10 DIAGNOSIS — J101 Influenza due to other identified influenza virus with other respiratory manifestations: Secondary | ICD-10-CM | POA: Diagnosis not present

## 2021-02-10 DIAGNOSIS — R509 Fever, unspecified: Secondary | ICD-10-CM | POA: Diagnosis present

## 2021-02-10 DIAGNOSIS — J111 Influenza due to unidentified influenza virus with other respiratory manifestations: Secondary | ICD-10-CM

## 2021-02-10 LAB — RESP PANEL BY RT-PCR (FLU A&B, COVID) ARPGX2
Influenza A by PCR: POSITIVE — AB
Influenza B by PCR: NEGATIVE
SARS Coronavirus 2 by RT PCR: NEGATIVE

## 2021-02-10 MED ORDER — ONDANSETRON 8 MG PO TBDP: 8.0000 mg | ORAL_TABLET | Freq: Three times a day (TID) | ORAL | 0 refills | Status: DC | PRN

## 2021-02-10 MED ORDER — ACETAMINOPHEN 325 MG PO TABS
650.0000 mg | ORAL_TABLET | Freq: Once | ORAL | Status: AC
  Administered 2021-02-10: 18:00:00 650 mg via ORAL
  Filled 2021-02-10: qty 2

## 2021-02-10 NOTE — ED Notes (Signed)
DC instructions reviewed with pt's. Pt verbalized understanding. PT DC.

## 2021-02-10 NOTE — ED Triage Notes (Signed)
Pt reports onset yesterday of bodyaches, fever, cough, headache.

## 2021-02-10 NOTE — ED Provider Notes (Signed)
Emergency Medicine Provider Triage Evaluation Note  Amy Guerrero , a 40 y.o. female  was evaluated in triage.  Pt complains of URI symptoms for "last few days". Patient not vaccinated against flu or covid. Patient has positive sick contacts. Also complaining of back pack. No red flag symptoms  Review of Systems  Positive: CP, SOB, cough, fever, chills, body aches Negative: Ab pain, N/V/D  Physical Exam  BP 119/88 (BP Location: Right Arm)    Pulse 99    Temp (!) 101.7 F (38.7 C) (Oral)    Resp 20    LMP 01/22/2021    SpO2 96%  Gen:   Awake, no distress   Resp:  Normal effort  MSK:   Moves extremities without difficulty  Other:    Medical Decision Making  Medically screening exam initiated at 11:29 AM.  Appropriate orders placed.  Amy Guerrero was informed that the remainder of the evaluation will be completed by another provider, this initial triage assessment does not replace that evaluation, and the importance of remaining in the ED until their evaluation is complete.     Al Decant, PA-C 02/10/21 1129    Melene Plan, DO 02/10/21 1313

## 2021-02-10 NOTE — Discharge Instructions (Signed)
Your x-ray is normal.  The swab result did indicate that you have influenza.  There is no cure for flu. Medications like Tamiflu have only shown to reduce your symptoms by 12 hours, and unfortunately have a lot of side effects.  Flu is self-limiting and most people.  You will need to hydrate well, take Tylenol and ibuprofen for severe body aches, headaches and fevers.  Nausea medicine for any nausea or vomiting.  Return to the ER if you start having worsening.

## 2021-02-10 NOTE — ED Provider Notes (Signed)
[facility] EMERGENCY DEPARTMENT Provider Note   CSN: [id number] Arrival date & time: 02/10/21  1034     History Chief Complaint  Patient presents with   Fever   Generalized Body Aches    Amy BOOTON is a 40 y.o. female.  HPI     SUBJECTIVE:  Amy Guerrero is a 40 y.o. female who present complaining of flu-like symptoms: fevers, chills, myalgias, congestion, sore throat and cough for 2 days. Denies dyspnea or wheezing. Patient has history of asthma.  Reports that she has had some back pain.  She has not received flu vaccine yet.   Past Medical History:  Diagnosis Date   Asthma    Sinusitis     Patient Active Problem List   Diagnosis Date Noted   Essential hypertension 05/07/2020   Anxiety and depression 05/07/2020    Past Surgical History:  Procedure Laterality Date   CARPAL TUNNEL RELEASE Right    TUBAL LIGATION     tubes tied       OB History   No obstetric history on file.     Family History  Problem Relation Age of Onset   Diabetes Mother    Diabetes Father     Social History   Tobacco Use   Smoking status: Never   Smokeless tobacco: Never  Vaping Use   Vaping Use: Former  Substance Use Topics   Alcohol use: Yes    Alcohol/week: 1.0 standard drink    Types: 1 Glasses of wine per week    Comment: occassionally   Drug use: Yes    Types: Marijuana    Home Medications Prior to Admission medications   Medication Sig Start Date End Date Taking? Authorizing Provider  albuterol (PROVENTIL) (2.5 MG/3ML) 0.083% nebulizer solution Take 3 mLs (2.5 mg total) by nebulization every 6 (six) hours as needed for wheezing or shortness of breath. 09/22/20   Wallis Bamberg, PA-C  albuterol (VENTOLIN HFA) 108 (90 Base) MCG/ACT inhaler Inhale 2 puffs into the lungs every 6 (six) hours as needed for wheezing or shortness of breath. 09/22/20   Wallis Bamberg, PA-C  amoxicillin (AMOXIL) 500 MG capsule Take 1 capsule (500 mg total) by mouth 3  (three) times daily. 01/03/21   Elson Areas, PA-C  Cetirizine HCl 10 MG CAPS Take 1 capsule (10 mg total) by mouth daily. 04/22/20 05/22/20  Bing Neighbors, FNP  cyclobenzaprine (FLEXERIL) 10 MG tablet Take 1 tablet (10 mg total) by mouth 2 (two) times daily as needed for muscle spasms. 11/25/20   Tomi Bamberger, PA-C  hydrocortisone (ANUSOL-HC) 2.5 % rectal cream Place 1 application rectally 2 (two) times daily. 07/17/20   Rema Fendt, NP  meloxicam (MOBIC) 15 MG tablet Take 1 tablet (15 mg total) by mouth daily. 01/22/21   Hudnall, Azucena Fallen, MD  methylPREDNISolone (MEDROL DOSEPAK) 4 MG TBPK tablet Take 6 tabs day 1, 5 tabs day 2, 4 tabs day 3, 3 tabs day 4, 2 tabs day 5, 1 tab day 6 02/03/21   Tomi Bamberger, PA-C  ondansetron (ZOFRAN-ODT) 8 MG disintegrating tablet Take 1 tablet (8 mg total) by mouth every 8 (eight) hours as needed for nausea or vomiting. 09/22/20   Wallis Bamberg, PA-C  Spacer/Aero-Holding Chambers (AEROCHAMBER PLUS) inhaler Use as instructed 05/07/20   Rema Fendt, NP  dicyclomine (BENTYL) 20 MG tablet Take 1 tablet (20 mg total) by mouth 4 (four) times daily -  before meals and at bedtime.  Patient not taking: Reported on 07/17/2020 06/18/20 07/22/20  Wieters, Hallie C, PA-C  famotidine (PEPCID) 40 MG tablet Take 1 tablet (40 mg total) by mouth daily. Patient not taking: Reported on 07/17/2020 06/18/20 07/22/20  Wieters, Hallie C, PA-C  lisinopril (ZESTRIL) 5 MG tablet Take 1 tablet (5 mg total) by mouth daily. Patient not taking: Reported on 07/17/2020 05/07/20 07/22/20  Rema Fendt, NP  sertraline (ZOLOFT) 25 MG tablet Take 1 tablet (25 mg total) by mouth at bedtime. Patient not taking: Reported on 07/17/2020 05/07/20 07/22/20  Rema Fendt, NP    Allergies    Patient has no known allergies.  Review of Systems   Review of Systems  Constitutional:  Positive for activity change, diaphoresis and fever.  HENT:  Positive for congestion.   Respiratory:  Positive for cough.    Gastrointestinal:  Positive for nausea and vomiting.   Physical Exam Updated Vital Signs BP 119/88 (BP Location: Right Arm)    Pulse 99    Temp (!) 101.7 F (38.7 C) (Oral)    Resp 20    LMP 01/22/2021    SpO2 96%   Physical Exam Vitals and nursing note reviewed.  Constitutional:      Appearance: She is well-developed. She is not toxic-appearing.  Eyes:     General: No scleral icterus.    Extraocular Movements: Extraocular movements intact.     Pupils: Pupils are equal, round, and reactive to light.  Cardiovascular:     Rate and Rhythm: Normal rate.  Pulmonary:     Effort: Pulmonary effort is normal. No respiratory distress.  Musculoskeletal:     Cervical back: Normal range of motion and neck supple.  Neurological:     Mental Status: She is alert and oriented to person, place, and time.    ED Results / Procedures / Treatments   Labs (all labs ordered are listed, but only abnormal results are displayed) Labs Reviewed  RESP PANEL BY RT-PCR (FLU A&B, COVID) ARPGX2 - Abnormal; Notable for the following components:      Result Value   Influenza A by PCR POSITIVE (*)    All other components within normal limits    EKG None  Radiology DG Chest 2 View  Result Date: 02/10/2021 CLINICAL DATA:  Body aches and fever EXAM: CHEST - 2 VIEW COMPARISON:  Radiograph 12/02/2013 FINDINGS: Normal mediastinum and cardiac silhouette. Normal pulmonary vasculature. No evidence of effusion, infiltrate, or pneumothorax. No acute bony abnormality. IMPRESSION: No acute cardiopulmonary process. Electronically Signed   By: Genevive Bi M.D.   On: 02/10/2021 11:49    Procedures Procedures   Medications Ordered in ED Medications  acetaminophen (TYLENOL) tablet 650 mg (has no administration in time range)    ED Course  I have reviewed the triage vital signs and the nursing notes.  Pertinent labs & imaging results that were available during my care of the patient were reviewed by me and  considered in my medical decision making (see chart for details).    MDM Rules/Calculators/A&P                         40 year old female comes in with flulike symptoms and is confirmed to have a flu.  She has a history of asthma.  It is well controlled.  At this time she is coughing but not having any wheezing.  Patient is nontoxic-appearing.  She appears moderately ill, is covering herself with a blanket.  P.o. intake  has been normal.  Discussed lab results with her.  Ensure that her COVID-19 test is negative.  Work note provided as per her request.  Patient advised to push fluids, take Tylenol, Motrin, Zofran for symptom relief.  Strict ER return precautions have been discussed, and patient is agreeing with the plan and is comfortable with the workup done and the recommendations from the ER.       Final Clinical Impression(s) / ED Diagnoses Final diagnoses:  Influenza    Rx / DC Orders ED Discharge Orders     None        Derwood Kaplan, MD 02/10/21 [phone]

## 2021-02-12 ENCOUNTER — Encounter: Payer: Self-pay | Admitting: Family

## 2021-02-12 ENCOUNTER — Other Ambulatory Visit: Payer: Self-pay | Admitting: Family

## 2021-02-12 NOTE — Telephone Encounter (Signed)
Offer patient appointment.

## 2021-02-18 NOTE — Progress Notes (Signed)
Erroneous encounter

## 2021-02-20 ENCOUNTER — Encounter: Payer: BC Managed Care – PPO | Admitting: Family

## 2021-04-12 NOTE — Progress Notes (Signed)
Erroneous encounter

## 2021-04-16 ENCOUNTER — Encounter: Payer: BC Managed Care – PPO | Admitting: Family

## 2021-08-27 ENCOUNTER — Ambulatory Visit: Payer: BC Managed Care – PPO

## 2021-10-02 ENCOUNTER — Ambulatory Visit
Admission: EM | Admit: 2021-10-02 | Discharge: 2021-10-02 | Disposition: A | Discharge status: Home / Self Care | Payer: BC Managed Care – PPO | Attending: Internal Medicine | Admitting: Internal Medicine

## 2021-10-02 ENCOUNTER — Other Ambulatory Visit: Payer: Self-pay | Admitting: Family Medicine

## 2021-10-02 ENCOUNTER — Encounter: Payer: Self-pay | Admitting: Emergency Medicine

## 2021-10-02 ENCOUNTER — Inpatient Hospital Stay
Admission: RE | Admit: 2021-10-02 | Discharge: 2021-10-02 | Disposition: A | Discharge status: Home / Self Care | Payer: Self-pay | Source: Ambulatory Visit

## 2021-10-02 DIAGNOSIS — M542 Cervicalgia: Secondary | ICD-10-CM

## 2021-10-02 DIAGNOSIS — T148XXA Other injury of unspecified body region, initial encounter: Secondary | ICD-10-CM

## 2021-10-02 MED ORDER — PREDNISONE 20 MG PO TABS: 40.0000 mg | ORAL_TABLET | Freq: Every day | ORAL | 0 refills | Status: AC

## 2021-10-02 MED ORDER — CYCLOBENZAPRINE HCL 5 MG PO TABS: 5.0000 mg | ORAL_TABLET | Freq: Two times a day (BID) | ORAL | 0 refills | Status: DC | PRN

## 2021-10-02 NOTE — ED Provider Notes (Signed)
[name]    CSN: [id number] Arrival date & time: 10/02/21  0907      History   Chief Complaint Chief Complaint  Patient presents with   Neck Pain    HPI BOBBIE VIRDEN is a 41 y.o. female.   Patient presents with right lateral neck pain that radiates into posterior shoulder that started yesterday.  Patient reports that she woke up with the pain.  Denies any obvious injury to the area.  Denies any history of chronic neck pain.  She has taken ibuprofen with no improvement in pain.  Denies any numbness or tingling.  Patient has limited range of motion of neck due to pain.   Neck Pain   Past Medical History:  Diagnosis Date   Asthma    Sinusitis     Patient Active Problem List   Diagnosis Date Noted   Essential hypertension 05/07/2020   Anxiety and depression 05/07/2020    Past Surgical History:  Procedure Laterality Date   CARPAL TUNNEL RELEASE Right    TUBAL LIGATION     tubes tied      OB History   No obstetric history on file.      Home Medications    Prior to Admission medications   Medication Sig Start Date End Date Taking? Authorizing Provider  cyclobenzaprine (FLEXERIL) 5 MG tablet Take 1 tablet (5 mg total) by mouth 2 (two) times daily as needed for muscle spasms. 10/02/21  Yes Manraj Yeo, Rolly Salter E, FNP  predniSONE (DELTASONE) 20 MG tablet Take 2 tablets (40 mg total) by mouth daily for 5 days. 10/02/21 10/07/21 Yes Johany Hansman, Acie Fredrickson, FNP  albuterol (PROVENTIL) (2.5 MG/3ML) 0.083% nebulizer solution Take 3 mLs (2.5 mg total) by nebulization every 6 (six) hours as needed for wheezing or shortness of breath. 09/22/20   Wallis Bamberg, PA-C  albuterol (VENTOLIN HFA) 108 (90 Base) MCG/ACT inhaler Inhale 2 puffs into the lungs every 6 (six) hours as needed for wheezing or shortness of breath. 09/22/20   Wallis Bamberg, PA-C  amoxicillin (AMOXIL) 500 MG capsule Take 1 capsule (500 mg total) by mouth 3 (three) times daily. 01/03/21   Elson Areas, PA-C   Cetirizine HCl 10 MG CAPS Take 1 capsule (10 mg total) by mouth daily. 04/22/20 05/22/20  Bing Neighbors, FNP  hydrocortisone (ANUSOL-HC) 2.5 % rectal cream Place 1 application rectally 2 (two) times daily. 07/17/20   Rema Fendt, NP  meloxicam (MOBIC) 15 MG tablet Take 1 tablet (15 mg total) by mouth daily. 01/22/21   Hudnall, Azucena Fallen, MD  ondansetron (ZOFRAN-ODT) 8 MG disintegrating tablet Take 1 tablet (8 mg total) by mouth every 8 (eight) hours as needed for nausea. 02/10/21   Derwood Kaplan, MD  Spacer/Aero-Holding Chambers (AEROCHAMBER PLUS) inhaler Use as instructed 05/07/20   Rema Fendt, NP  dicyclomine (BENTYL) 20 MG tablet Take 1 tablet (20 mg total) by mouth 4 (four) times daily -  before meals and at bedtime. Patient not taking: Reported on 07/17/2020 06/18/20 07/22/20  Wieters, Hallie C, PA-C  famotidine (PEPCID) 40 MG tablet Take 1 tablet (40 mg total) by mouth daily. Patient not taking: Reported on 07/17/2020 06/18/20 07/22/20  Wieters, Hallie C, PA-C  lisinopril (ZESTRIL) 5 MG tablet Take 1 tablet (5 mg total) by mouth daily. Patient not taking: Reported on 07/17/2020 05/07/20 07/22/20  Rema Fendt, NP  sertraline (ZOLOFT) 25 MG tablet Take 1 tablet (25 mg total) by mouth at bedtime. Patient not taking: Reported  on 07/17/2020 05/07/20 07/22/20  Rema Fendt, NP    Family History Family History  Problem Relation Age of Onset   Diabetes Mother    Diabetes Father     Social History Social History   Tobacco Use   Smoking status: Never   Smokeless tobacco: Never  Vaping Use   Vaping Use: Former  Substance Use Topics   Alcohol use: Yes    Alcohol/week: 1.0 standard drink of alcohol    Types: 1 Glasses of wine per week    Comment: occassionally   Drug use: Yes    Types: Marijuana     Allergies   Patient has no known allergies.   Review of Systems Review of Systems Per HPI  Physical Exam Triage Vital Signs ED Triage Vitals  Enc Vitals Group     BP 10/02/21 0947  138/87     Pulse Rate 10/02/21 0947 67     Resp 10/02/21 0947 18     Temp 10/02/21 0947 98.1 F (36.7 C)     Temp src --      SpO2 10/02/21 0947 98 %     Weight --      Height --      Head Circumference --      Peak Flow --      Pain Score 10/02/21 0946 7     Pain Loc --      Pain Edu? --      Excl. in GC? --    No data found.  Updated Vital Signs BP 138/87   Pulse 67   Temp 98.1 F (36.7 C)   Resp 18   SpO2 98%   Visual Acuity Right Eye Distance:   Left Eye Distance:   Bilateral Distance:    Right Eye Near:   Left Eye Near:    Bilateral Near:     Physical Exam Constitutional:      General: She is not in acute distress.    Appearance: Normal appearance. She is not toxic-appearing or diaphoretic.  HENT:     Head: Normocephalic and atraumatic.  Eyes:     Extraocular Movements: Extraocular movements intact.     Conjunctiva/sclera: Conjunctivae normal.  Neck:     Comments: Tenderness to palpation to right lateral neck/paraspinal muscles.  No direct spinal tenderness, crepitus, step-off.  Patient also has tenderness to palpation to right upper thoracic area.  No tenderness to lateral or anterior shoulder.  Patient has full range of motion of shoulder and neck.  Grip strength 5/5.  Neurovascular intact.  No swelling or discoloration noted. Pulmonary:     Effort: Pulmonary effort is normal.  Musculoskeletal:     Cervical back: Normal range of motion. No edema, erythema or crepitus. Muscular tenderness present. No spinous process tenderness.  Neurological:     General: No focal deficit present.     Mental Status: She is alert and oriented to person, place, and time. Mental status is at baseline.  Psychiatric:        Mood and Affect: Mood normal.        Behavior: Behavior normal.        Thought Content: Thought content normal.        Judgment: Judgment normal.      UC Treatments / Results  Labs (all labs ordered are listed, but only abnormal results are  displayed) Labs Reviewed - No data to display  EKG   Radiology No results found.  Procedures Procedures (including critical care  time)  Medications Ordered in UC Medications - No data to display  Initial Impression / Assessment and Plan / UC Course  I have reviewed the triage vital signs and the nursing notes.  Pertinent labs & imaging results that were available during my care of the patient were reviewed by me and considered in my medical decision making (see chart for details).     Patient's physical exam is consistent with muscular strain.  Do not think imaging is necessary given no direct spinal tenderness or injury.  Will treat with muscle relaxer and prednisone as patient has taken this before and tolerated well.  Advised patient muscle relaxer can cause drowsiness and do not take while driving or drinking alcohol.  Discussed supportive care.  Discussed return precautions.  Patient verbalized understanding and was agreeable with plan. Final Clinical Impressions(s) / UC Diagnoses   Final diagnoses:  Muscle strain  Neck pain     Discharge Instructions      You have a muscle strain of your neck/upper back.  This is being treated with a muscle relaxer and prednisone.  Please be advised that muscle relaxer can cause drowsiness so do not drive, operate heavy machinery, drink alcohol while taking this.  Also recommend alternating ice and heat to affected area.  Follow-up if symptoms persist or worsen.    ED Prescriptions     Medication Sig Dispense Auth. Provider   predniSONE (DELTASONE) 20 MG tablet Take 2 tablets (40 mg total) by mouth daily for 5 days. 10 tablet [place], [place] E, [place]   cyclobenzaprine (FLEXERIL) 5 MG tablet Take 1 tablet (5 mg total) by mouth 2 (two) times daily as needed for muscle spasms. 20 tablet [place], Michele Rockers, [place]      PDMP not reviewed this encounter.   Teodora Medici, [place] 10/02/21 1050

## 2021-10-02 NOTE — ED Triage Notes (Signed)
Pt is present today with right neck pain radiating to her shoulder. Pt sx started yesterday

## 2021-10-02 NOTE — Discharge Instructions (Signed)
You have a muscle strain of your neck/upper back.  This is being treated with a muscle relaxer and prednisone.  Please be advised that muscle relaxer can cause drowsiness so do not drive, operate heavy machinery, drink alcohol while taking this.  Also recommend alternating ice and heat to affected area.  Follow-up if symptoms persist or worsen.

## 2021-10-03 MED ORDER — CETIRIZINE HCL 10 MG PO CAPS: 10.0000 mg | ORAL_CAPSULE | Freq: Every day | ORAL | 0 refills | Status: DC

## 2021-12-26 ENCOUNTER — Other Ambulatory Visit: Payer: Self-pay

## 2021-12-26 DIAGNOSIS — J329 Chronic sinusitis, unspecified: Secondary | ICD-10-CM

## 2021-12-26 MED ORDER — CETIRIZINE HCL 10 MG PO CAPS: 10.0000 mg | ORAL_CAPSULE | Freq: Every day | ORAL | 0 refills | Status: AC

## 2022-05-17 IMAGING — CR DG CHEST 2V
2 series · 2 of 2 positions shown · non-contrast
Comparison: Radiograph 12/02/2013

CLINICAL DATA: Body aches and fever

EXAM:
CHEST - 2 VIEW

[chest pa]
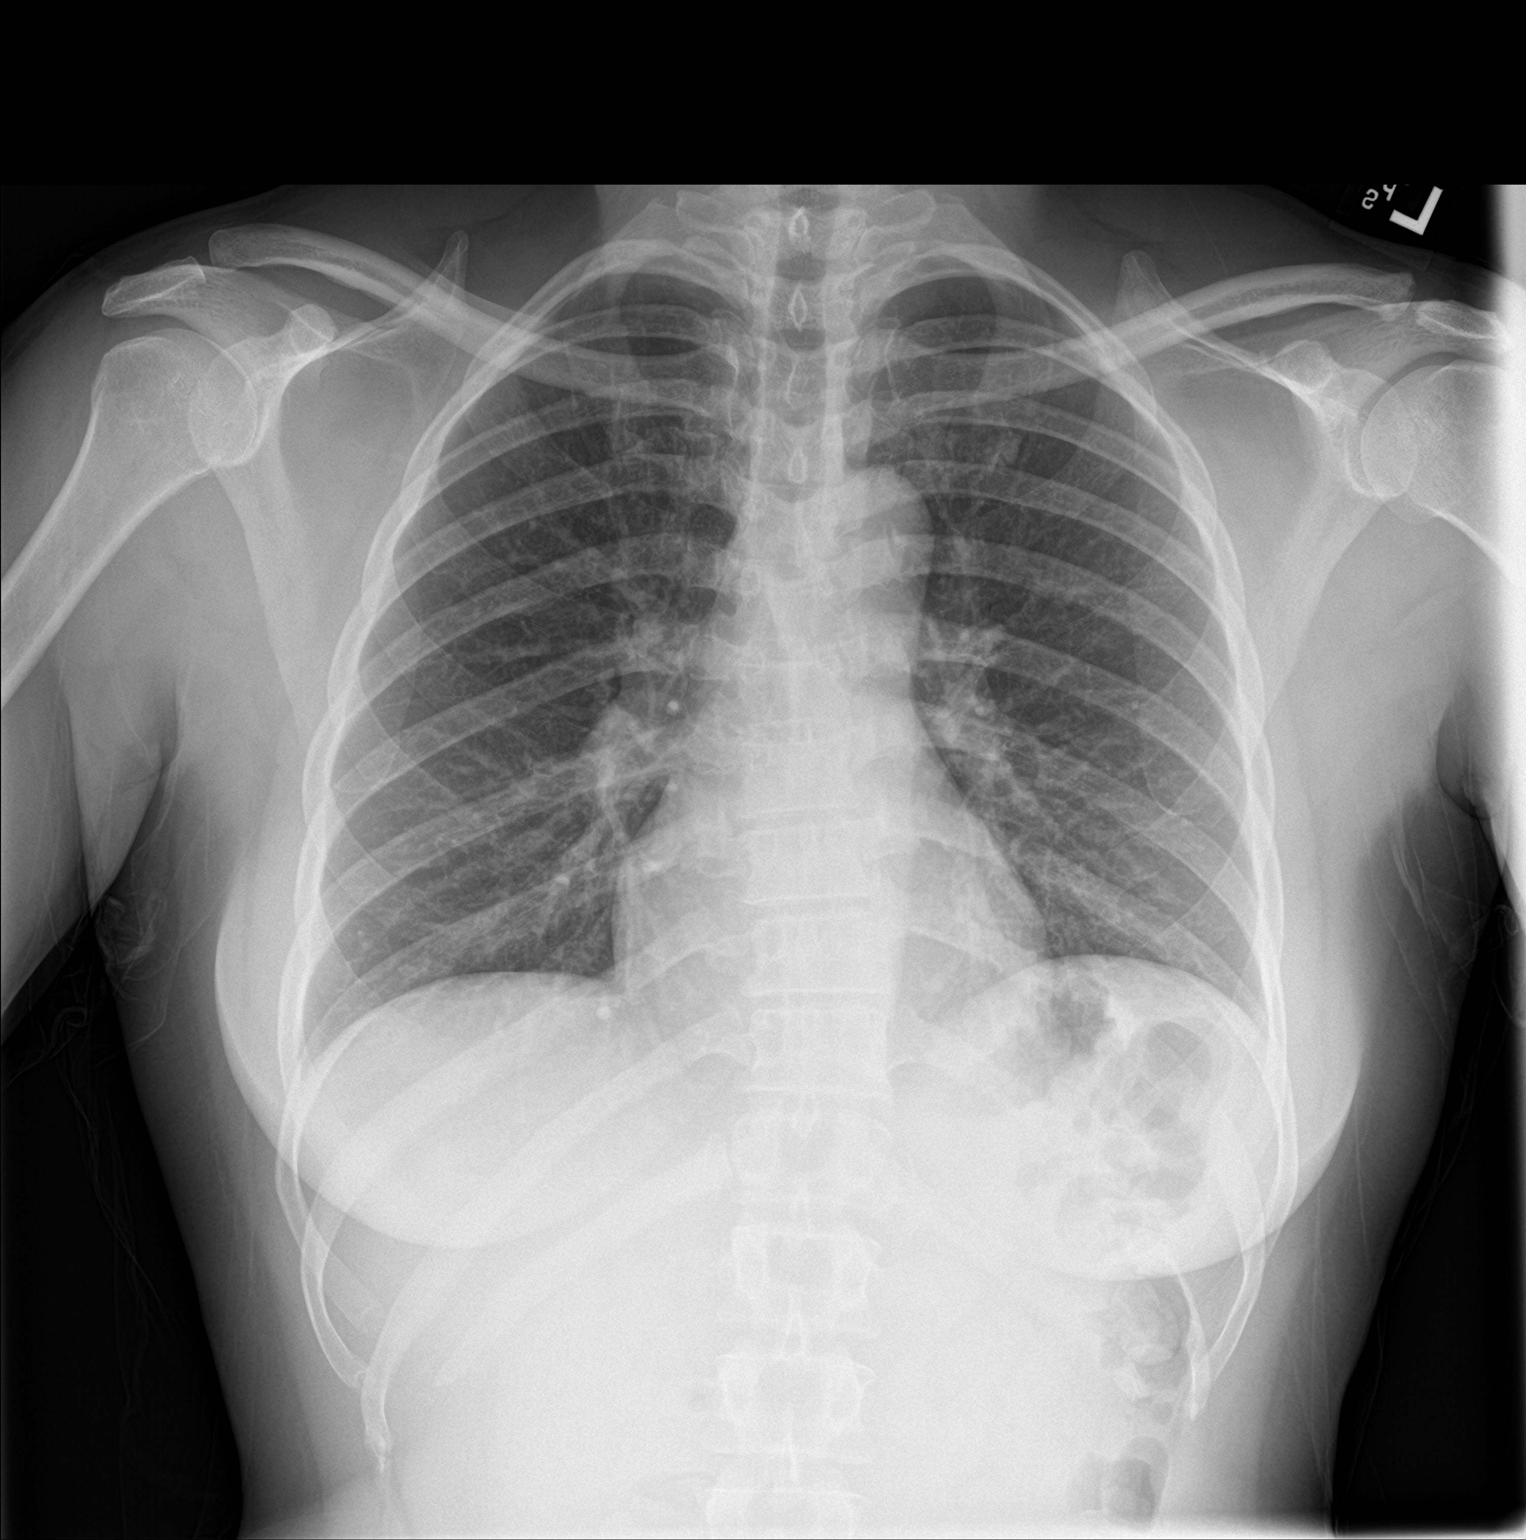

[chest lat]
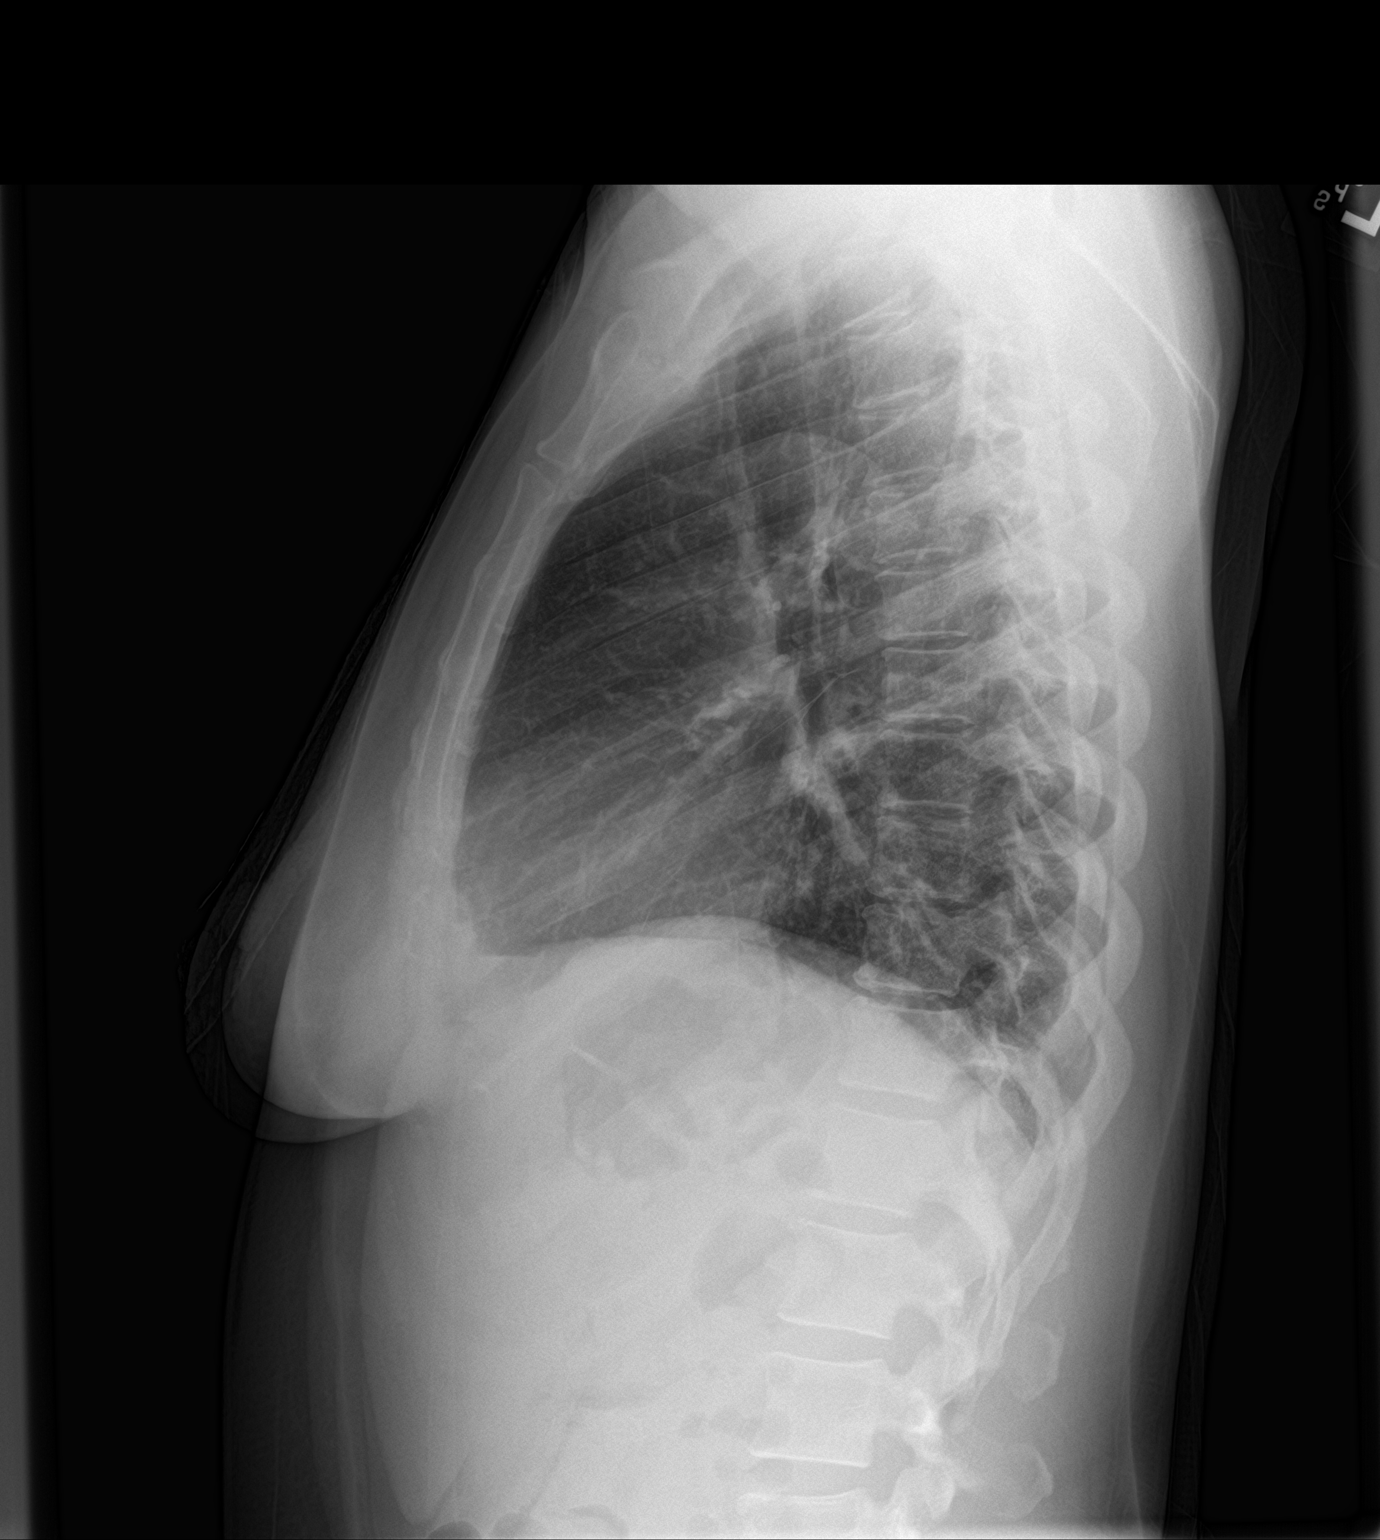

[2 of 2 positions shown; findings below may reference images not displayed]

FINDINGS: Normal mediastinum and cardiac silhouette. Normal pulmonary
vasculature. No evidence of effusion, infiltrate, or pneumothorax.
No acute bony abnormality.
IMPRESSION: No acute cardiopulmonary process.

## 2022-06-01 ENCOUNTER — Encounter: Payer: Self-pay | Admitting: *Deleted

## 2022-06-11 ENCOUNTER — Ambulatory Visit: Payer: Self-pay

## 2022-06-11 ENCOUNTER — Other Ambulatory Visit: Payer: Self-pay | Admitting: Family

## 2022-06-11 DIAGNOSIS — M79641 Pain in right hand: Secondary | ICD-10-CM

## 2022-07-07 DIAGNOSIS — M79644 Pain in right finger(s): Secondary | ICD-10-CM | POA: Insufficient documentation

## 2022-07-19 ENCOUNTER — Emergency Department (HOSPITAL_COMMUNITY)
Admission: EM | Admit: 2022-07-19 | Discharge: 2022-07-19 | Disposition: A | Discharge status: Home / Self Care | Payer: BC Managed Care – PPO | Attending: Emergency Medicine | Admitting: Emergency Medicine

## 2022-07-19 ENCOUNTER — Encounter (HOSPITAL_COMMUNITY): Payer: Self-pay

## 2022-07-19 ENCOUNTER — Emergency Department (HOSPITAL_COMMUNITY): Payer: BC Managed Care – PPO

## 2022-07-19 ENCOUNTER — Other Ambulatory Visit: Payer: Self-pay

## 2022-07-19 DIAGNOSIS — Z2914 Encounter for prophylactic rabies immune globin: Secondary | ICD-10-CM | POA: Diagnosis not present

## 2022-07-19 DIAGNOSIS — W540XXA Bitten by dog, initial encounter: Secondary | ICD-10-CM | POA: Insufficient documentation

## 2022-07-19 DIAGNOSIS — Z79899 Other long term (current) drug therapy: Secondary | ICD-10-CM | POA: Insufficient documentation

## 2022-07-19 DIAGNOSIS — S31825A Open bite of left buttock, initial encounter: Secondary | ICD-10-CM | POA: Diagnosis not present

## 2022-07-19 DIAGNOSIS — Z203 Contact with and (suspected) exposure to rabies: Secondary | ICD-10-CM | POA: Insufficient documentation

## 2022-07-19 DIAGNOSIS — S81852A Open bite, left lower leg, initial encounter: Secondary | ICD-10-CM | POA: Diagnosis present

## 2022-07-19 DIAGNOSIS — Z23 Encounter for immunization: Secondary | ICD-10-CM | POA: Insufficient documentation

## 2022-07-19 MED ORDER — HYDROCODONE-ACETAMINOPHEN 5-325 MG PO TABS
1.0000 | ORAL_TABLET | Freq: Once | ORAL | Status: AC
  Administered 2022-07-19: 1 via ORAL
  Filled 2022-07-19: qty 1

## 2022-07-19 MED ORDER — ONDANSETRON HCL 4 MG/2ML IJ SOLN
4.0000 mg | Freq: Once | INTRAMUSCULAR | Status: AC
  Administered 2022-07-19: 4 mg via INTRAVENOUS
  Filled 2022-07-19: qty 2

## 2022-07-19 MED ORDER — AMOXICILLIN-POT CLAVULANATE 875-125 MG PO TABS
1.0000 | ORAL_TABLET | Freq: Once | ORAL | Status: AC
  Administered 2022-07-19: 1 via ORAL
  Filled 2022-07-19: qty 1

## 2022-07-19 MED ORDER — BACITRACIN ZINC 500 UNIT/GM EX OINT
TOPICAL_OINTMENT | Freq: Once | CUTANEOUS | Status: AC
  Administered 2022-07-19: 1 via TOPICAL
  Filled 2022-07-19: qty 0.9

## 2022-07-19 MED ORDER — LIDOCAINE HCL (PF) 1 % IJ SOLN
5.0000 mL | Freq: Once | INTRAMUSCULAR | Status: AC
  Administered 2022-07-19: 5 mL
  Filled 2022-07-19: qty 5

## 2022-07-19 MED ORDER — RABIES IMMUNE GLOBULIN 150 UNIT/ML IM INJ: 20.0000 [IU]/kg | INJECTION | Freq: Once | INTRAMUSCULAR | Status: DC

## 2022-07-19 MED ORDER — RABIES VACCINE, PCEC IM SUSR
1.0000 mL | Freq: Once | INTRAMUSCULAR | Status: AC
  Administered 2022-07-19: 1 mL via INTRAMUSCULAR
  Filled 2022-07-19: qty 1

## 2022-07-19 MED ORDER — LIDOCAINE HCL (PF) 1 % IJ SOLN: 30.0000 mL | Freq: Once | INTRAMUSCULAR | Status: DC

## 2022-07-19 MED ORDER — RABIES IMMUNE GLOBULIN 150 UNIT/ML IM INJ
20.0000 [IU]/kg | INJECTION | Freq: Once | INTRAMUSCULAR | Status: AC
  Administered 2022-07-19: 1425 [IU] via INTRAMUSCULAR
  Filled 2022-07-19: qty 10

## 2022-07-19 MED ORDER — HYDROMORPHONE HCL 1 MG/ML IJ SOLN
1.0000 mg | Freq: Once | INTRAMUSCULAR | Status: AC
Start: 2022-07-19 — End: 2022-07-19
  Administered 2022-07-19: 1 mg via INTRAVENOUS
  Filled 2022-07-19: qty 1

## 2022-07-19 MED ORDER — TETANUS-DIPHTH-ACELL PERTUSSIS 5-2.5-18.5 LF-MCG/0.5 IM SUSY
0.5000 mL | PREFILLED_SYRINGE | Freq: Once | INTRAMUSCULAR | Status: AC
  Administered 2022-07-19: 0.5 mL via INTRAMUSCULAR
  Filled 2022-07-19: qty 0.5

## 2022-07-19 MED ORDER — HYDROCODONE-ACETAMINOPHEN 5-325 MG PO TABS: 1.0000 | ORAL_TABLET | Freq: Four times a day (QID) | ORAL | 0 refills | Status: AC | PRN

## 2022-07-19 MED ORDER — RABIES VACCINE, PCEC IM SUSR: 1.0000 mL | Freq: Once | INTRAMUSCULAR | Status: DC

## 2022-07-19 MED ORDER — AMOXICILLIN-POT CLAVULANATE 875-125 MG PO TABS: 1.0000 | ORAL_TABLET | Freq: Two times a day (BID) | ORAL | 0 refills | Status: AC

## 2022-07-19 NOTE — ED Notes (Signed)
Irrigated wounds with sterile water and betadine.

## 2022-07-19 NOTE — ED Triage Notes (Signed)
PT BIB EMS for multiple dog bites by a [place] to her left leg and left buttocks, right arm. Tears and punctures noted.   Leg splint to left leg 20 g L hand

## 2022-07-19 NOTE — ED Provider Notes (Signed)
[place] EMERGENCY DEPARTMENT AT [facility] Provider Note   CSN: [id number] Arrival date & time: 07/19/22  1308     History  Chief Complaint  Patient presents with   Animal Bite    Amy Guerrero is a 42 y.o. female presented after being bitten by her dog an hour ago.  Patient states she went to take the dog out when the dog bit her in her left lower leg and left buttocks.  Patient's dog was a 36-year-old great Dane.  Patient states that the second time the dog has been her.  Patient states the dog has not gone his 1 year vaccinations and is unsure if he is up-to-date on his rabies.  Patient states that she has extreme pain to her left lower leg due to the tears and is concerned that she will lose her leg.  Patient states she still able to feel her foot and move her foot.  After dog bit patient patient's cousin killed the dog as this was the second if she has not been.  Patient is unsure when last tetanus was.  Home Medications Prior to Admission medications   Medication Sig Start Date End Date Taking? Authorizing Provider  amoxicillin-clavulanate (AUGMENTIN) 875-125 MG tablet Take 1 tablet by mouth every 12 (twelve) hours for 10 days. 07/19/22 07/29/22 Yes Catharine Kettlewell, Beverly Gust, PA-C  HYDROcodone-acetaminophen (NORCO) 5-325 MG tablet Take 1 tablet by mouth every 6 (six) hours as needed for up to 3 days for moderate pain. 07/19/22 07/22/22 Yes Keniesha Adderly, Beverly Gust, PA-C  albuterol (PROVENTIL) (2.5 MG/3ML) 0.083% nebulizer solution Take 3 mLs (2.5 mg total) by nebulization every 6 (six) hours as needed for wheezing or shortness of breath. 09/22/20   Wallis Bamberg, PA-C  albuterol (VENTOLIN HFA) 108 (90 Base) MCG/ACT inhaler Inhale 2 puffs into the lungs every 6 (six) hours as needed for wheezing or shortness of breath. 09/22/20   Wallis Bamberg, PA-C  amoxicillin (AMOXIL) 500 MG capsule Take 1 capsule (500 mg total) by mouth 3 (three) times daily. 01/03/21   Elson Areas, PA-C  Cetirizine HCl 10 MG  CAPS Take 1 capsule (10 mg total) by mouth daily. 12/26/21 03/26/22  Rema Fendt, NP  cyclobenzaprine (FLEXERIL) 5 MG tablet Take 1 tablet (5 mg total) by mouth 2 (two) times daily as needed for muscle spasms. 10/02/21   Gustavus Bryant, FNP  hydrocortisone (ANUSOL-HC) 2.5 % rectal cream Place 1 application rectally 2 (two) times daily. 07/17/20   Rema Fendt, NP  meloxicam (MOBIC) 15 MG tablet Take 1 tablet (15 mg total) by mouth daily. 01/22/21   Hudnall, Azucena Fallen, MD  ondansetron (ZOFRAN-ODT) 8 MG disintegrating tablet Take 1 tablet (8 mg total) by mouth every 8 (eight) hours as needed for nausea. 02/10/21   Derwood Kaplan, MD  Spacer/Aero-Holding Chambers (AEROCHAMBER PLUS) inhaler Use as instructed 05/07/20   Rema Fendt, NP  dicyclomine (BENTYL) 20 MG tablet Take 1 tablet (20 mg total) by mouth 4 (four) times daily -  before meals and at bedtime. Patient not taking: Reported on 07/17/2020 06/18/20 07/22/20  Wieters, Hallie C, PA-C  famotidine (PEPCID) 40 MG tablet Take 1 tablet (40 mg total) by mouth daily. Patient not taking: Reported on 07/17/2020 06/18/20 07/22/20  Wieters, Hallie C, PA-C  lisinopril (ZESTRIL) 5 MG tablet Take 1 tablet (5 mg total) by mouth daily. Patient not taking: Reported on 07/17/2020 05/07/20 07/22/20  Rema Fendt, NP  sertraline (ZOLOFT) 25 MG tablet  Take 1 tablet (25 mg total) by mouth at bedtime. Patient not taking: Reported on 07/17/2020 05/07/20 07/22/20  Rema Fendt, NP      Allergies    Patient has no known allergies.    Review of Systems   Review of Systems See HPI Physical Exam Updated Vital Signs BP 118/77   Pulse 63   Temp 98.1 F (36.7 C) (Oral)   Resp 15   Ht 5\' 1"  (1.549 m)   Wt 69.4 kg   SpO2 97%   BMI 28.91 kg/m  Physical Exam Constitutional:      General: She is in acute distress.  Cardiovascular:     Pulses: Normal pulses.     Heart sounds: Normal heart sounds.     Comments: 2+ left-sided dorsalis pedal pulse with regular  rate Musculoskeletal:     Comments: Able to wiggle toes, mildly range left ankle Soft compartments Bony tenderness to left tibia when palpated however no abnormalities were palpated  Skin:    General: Skin is warm and dry.     Capillary Refill: Capillary refill takes less than 2 seconds.     Comments: Multiple wounds noted to left lower leg on anterior and posterior side with no obvious foreign bodies noted Superficial bite mark noted to left buttocks  Neurological:     Mental Status: She is alert.     Comments: Sensation intact distally     ED Results / Procedures / Treatments   Labs (all labs ordered are listed, but only abnormal results are displayed) Labs Reviewed  I-STAT BETA HCG BLOOD, ED ([facility], [facility], [facility] ONLY)    EKG None  Radiology DG Tibia/Fibula Left  Result Date: 07/19/2022 CLINICAL DATA:  Dog bites. EXAM: LEFT TIBIA AND FIBULA - 2 VIEW COMPARISON:  None Available. FINDINGS: No fracture. No bone lesion. Knee and ankle joints are normally spaced and aligned. Soft tissue injury noted to the posteromedial mid leg. No radiopaque foreign body. IMPRESSION: 1. No fracture or dislocation. 2. Soft tissue injury without evidence of a radiopaque foreign body. Electronically Signed   By: Amie Portland M.D.   On: 07/19/2022 10:53    Procedures .Marland KitchenLaceration Repair  Date/Time: 07/19/2022 12:55 PM  Performed by: Netta Corrigan, PA-C Authorized by: Netta Corrigan, PA-C   Consent:    Consent obtained:  Verbal   Consent given by:  Patient   Risks, benefits, and alternatives were discussed: yes     Risks discussed:  Infection, nerve damage, need for additional repair, pain, poor cosmetic result and poor wound healing   Alternatives discussed:  No treatment Universal protocol:    Procedure explained and questions answered to patient or proxy's satisfaction: yes     Imaging studies available: yes     Patient identity confirmed:  Verbally with patient Anesthesia:    Anesthesia  method:  Local infiltration   Local anesthetic:  Lidocaine 1% w/o epi Laceration details:    Location:  Leg   Leg location:  L lower leg   Length (cm):  4   Depth (mm):  4 Treatment:    Area cleansed with:  Povidone-iodine   Amount of cleaning:  Extensive   Irrigation solution:  Sterile water   Irrigation volume:  1000 mL   Irrigation method:  Pressure wash   Visualized foreign bodies/material removed: no     Debridement:  None Skin repair:    Repair method:  Sutures   Suture size:  3-0   Suture material:  Prolene   Suture technique:  Simple interrupted   Number of sutures:  2 Approximation:    Approximation:  Loose Repair type:    Repair type:  Intermediate Post-procedure details:    Dressing:  Antibiotic ointment and non-adherent dressing   Procedure completion:  Tolerated .Marland KitchenLaceration Repair  Date/Time: 07/19/2022 12:58 PM  Performed by: Netta Corrigan, PA-C Authorized by: Netta Corrigan, PA-C   Consent:    Consent obtained:  Verbal   Consent given by:  Patient   Risks, benefits, and alternatives were discussed: yes   Laceration details:    Location:  Leg   Leg location:  L lower leg   Length (cm):  2   Depth (mm):  1 Treatment:    Area cleansed with:  Povidone-iodine   Amount of cleaning:  Extensive   Irrigation solution:  Sterile water   Irrigation volume:  1000 mL   Irrigation method:  Pressure wash   Visualized foreign bodies/material removed: no     Debridement:  None Skin repair:    Repair method:  Steri-Strips   Number of Steri-Strips:  2 Approximation:    Approximation:  Loose Repair type:    Repair type:  Simple Post-procedure details:    Dressing:  Antibiotic ointment and non-adherent dressing   Procedure completion:  Tolerated .Marland KitchenLaceration Repair  Date/Time: 07/19/2022 1:02 PM  Performed by: Netta Corrigan, PA-C Authorized by: Netta Corrigan, PA-C   Consent:    Consent obtained:  Verbal   Consent given by:  Patient   Risks,  benefits, and alternatives were discussed: yes   Laceration details:    Location:  Leg   Leg location:  L lower leg   Length (cm):  1   Depth (mm):  1 Treatment:    Area cleansed with:  Povidone-iodine   Amount of cleaning:  Extensive   Irrigation solution:  Sterile water   Irrigation volume:  1000 mL   Irrigation method:  Pressure wash   Visualized foreign bodies/material removed: no     Debridement:  None Skin repair:    Repair method:  Steri-Strips   Number of Steri-Strips:  1 Approximation:    Approximation:  Loose Repair type:    Repair type:  Intermediate Post-procedure details:    Dressing:  Antibiotic ointment and non-adherent dressing   Procedure completion:  Tolerated .Marland KitchenLaceration Repair  Date/Time: 07/19/2022 1:03 PM  Performed by: Netta Corrigan, PA-C Authorized by: Netta Corrigan, PA-C   Consent:    Consent obtained:  Verbal   Consent given by:  Patient   Risks, benefits, and alternatives were discussed: yes   Laceration details:    Location:  Leg   Leg location:  L lower leg   Length (cm):  1   Depth (mm):  1 Treatment:    Area cleansed with:  Povidone-iodine   Amount of cleaning:  Extensive   Irrigation solution:  Sterile saline   Irrigation volume:  1000 mL   Irrigation method:  Pressure wash   Visualized foreign bodies/material removed: no     Debridement:  None Skin repair:    Repair method:  Steri-Strips   Number of Steri-Strips:  1 Approximation:    Approximation:  Loose Repair type:    Repair type:  Intermediate Post-procedure details:    Dressing:  Antibiotic ointment and non-adherent dressing   Procedure completion:  Tolerated .Marland KitchenLaceration Repair  Date/Time: 07/19/2022 1:05 PM  Performed by: Netta Corrigan, PA-C Authorized by: Netta Corrigan,  PA-C   Consent:    Consent obtained:  Verbal   Consent given by:  Patient   Risks, benefits, and alternatives were discussed: yes   Laceration details:    Location:  Leg   Leg  location:  L lower leg   Length (cm):  1   Depth (mm):  1 Treatment:    Area cleansed with:  Povidone-iodine   Amount of cleaning:  Extensive   Irrigation solution:  Sterile saline   Irrigation volume:  1000 mL   Irrigation method:  Pressure wash   Visualized foreign bodies/material removed: no     Debridement:  None Skin repair:    Repair method:  Steri-Strips   Number of Steri-Strips:  1 Approximation:    Approximation:  Loose Repair type:    Repair type:  Intermediate Post-procedure details:    Dressing:  Antibiotic ointment and non-adherent dressing   Procedure completion:  Tolerated .Marland KitchenLaceration Repair  Date/Time: 07/19/2022 1:05 PM  Performed by: Netta Corrigan, PA-C Authorized by: Netta Corrigan, PA-C   Consent:    Consent obtained:  Verbal   Consent given by:  Patient   Risks, benefits, and alternatives were discussed: yes   Laceration details:    Location:  Leg   Leg location:  L lower leg   Length (cm):  2   Depth (mm):  1 Treatment:    Area cleansed with:  Povidone-iodine   Amount of cleaning:  Extensive   Irrigation solution:  Sterile saline   Irrigation volume:  1000 mL   Irrigation method:  Pressure wash   Visualized foreign bodies/material removed: no     Debridement:  None Skin repair:    Repair method:  Steri-Strips   Number of Steri-Strips:  1 Approximation:    Approximation:  Loose Repair type:    Repair type:  Intermediate Post-procedure details:    Dressing:  Antibiotic ointment and non-adherent dressing   Procedure completion:  Tolerated .Marland KitchenLaceration Repair  Date/Time: 07/19/2022 1:06 PM  Performed by: Netta Corrigan, PA-C Authorized by: Netta Corrigan, PA-C   Consent:    Consent obtained:  Verbal   Consent given by:  Patient   Risks, benefits, and alternatives were discussed: yes   Laceration details:    Location:  Leg   Leg location:  L lower leg   Length (cm):  4   Depth (mm):  4 Treatment:    Area cleansed with:   Povidone-iodine   Amount of cleaning:  Extensive   Irrigation solution:  Sterile saline   Irrigation volume:  1000 mL   Irrigation method:  Pressure wash   Visualized foreign bodies/material removed: no     Debridement:  None Skin repair:    Repair method:  Sutures   Suture size:  3-0   Suture material:  Prolene   Suture technique:  Simple interrupted   Number of sutures:  2 Approximation:    Approximation:  Loose Repair type:    Repair type:  Intermediate Post-procedure details:    Dressing:  Antibiotic ointment and non-adherent dressing   Procedure completion:  Tolerated     Medications Ordered in ED Medications  rabies immune globulin (HYPERRAB/KEDRAB) injection 1,425 Units (has no administration in time range)  amoxicillin-clavulanate (AUGMENTIN) 875-125 MG per tablet 1 tablet (has no administration in time range)  bacitracin ointment (has no administration in time range)  HYDROmorphone (DILAUDID) injection 1 mg (1 mg Intravenous Given 07/19/22 1056)  Tdap (BOOSTRIX) injection 0.5 mL (0.5 mLs  Intramuscular Given 07/19/22 1058)  ondansetron (ZOFRAN) injection 4 mg (4 mg Intravenous Given 07/19/22 1055)  rabies vaccine (RABAVERT) injection 1 mL (1 mL Intramuscular Given 07/19/22 1246)  lidocaine (PF) (XYLOCAINE) 1 % injection 5 mL (5 mLs Infiltration Given 07/19/22 1249)    ED Course/ Medical Decision Making/ A&P                             Medical Decision Making Amount and/or Complexity of Data Reviewed Radiology: ordered.  Risk Prescription drug management.   Amy Guerrero 42 y.o. presented today for dog bite. Working DDx that I considered at this time includes, but not limited to, dog bite, rabies, foreign body, bony fracture, compartment syndrome, neurovascular compromise.  R/o DDx: foreign body, bony fracture, compartment syndrome, neurovascular compromise: These are considered less likely due to history of present illness and physical exam findings  Review of  prior external notes: 10/02/2021 ED  Unique Tests and My Interpretation:  Left tib-fib x-ray: No acute osseous changes, no foreign bodies  Discussion with Independent Historian: None  Discussion of Management of Tests: None  Risk: Medium: prescription drug management  Risk Stratification Score: none  Staffed with Lynelle Doctor, MD  Plan: Patient presented for dog bite. On exam patient was in distress but had stable vitals.  Patient's exam did show the patient had multiple lacerations to her left lower leg and a small bite mark on her left buttocks.  No obvious foreign bodies are noted and patient good pulses motor sensation distally with good cap refill.  No skin color changes were noted or discharge from these wounds were noted.  Patient did have tibial tenderness when palpating however no other mass palpated.  Patient be given Dilaudid and tetanus that she is unsure when her last tetanus shot was.  Patient initially reported that dog was killed by her cousin shortly after biting her but went on to recheck on her patient stated the dog was still alive and was sent to an animal shelter that she is unsure of.  They stated she would call her cousin to see which shelter the dog was brought to.  Rabies prophylaxis will be withheld at this time as dog can be tested.  Animal control will be notified.  Patient stable this time.  Patient had 4 loose sutures placed on 2 large lacerations on the anterior and posterior aspect of her lower leg along with 6 Steri-Strips.  The sutures were placed due to the extent of patient's laceration but were placed loosely. I spoke to the patient and initially patient stated she felt comfortable not getting the rabies prophylaxis however verbalized to the nurse that she wants the rabies prophylaxis.  Patient will be given the first round of rabies shots and I spoke to the patient about how she will need to return in 3, 7, 14 days.  Spoke to the patient how she will need to have her  sutures removed in 8 to 10 days and that she may use Neosporin ointment to keep the areas clean and dry.  I strongly encouraged patient to follow-up with her primary care provider to be reevaluated as symptoms may change.  Patient be given 1 dose of Augmentin before she leaves and given a prescription of it.  Patient also be given Norco for breakthrough pain not controlled by Tylenol or ibuprofen.  Patient will be given crutches and a dose of Norco before discharge as patient stated she  was still endorsing mild pain.  Patient was given return precautions. Patient stable for discharge at this time.  Patient verbalized understanding of plan.         Final Clinical Impression(s) / ED Diagnoses Final diagnoses:  Dog bite, initial encounter    Rx / DC Orders ED Discharge Orders          Ordered    amoxicillin-clavulanate (AUGMENTIN) 875-125 MG tablet  Every 12 hours        07/19/22 1241    HYDROcodone-acetaminophen (NORCO) 5-325 MG tablet  Every 6 hours PRN        07/19/22 1242              Netta Corrigan, PA-C 07/19/22 1308    Linwood Dibbles, MD 07/20/22 [phone]

## 2022-07-19 NOTE — Discharge Instructions (Addendum)
Please follow-up with your primary care provider.  Please pick up the antibiotics I prescribed for you. I have prescribed Norco for you to take if pain is not controlled with ibuprofen/tylenol every 6 hrs. you need to have your sutures removed in Lower extremities - 8 to 10 days.  You have 4 sutures placed that are loose and 6 Steri-Strips.  You may use Neosporin ointment over the area and keep the area covered.  If you begin to have worsening of symptoms please return to ER.  You will need to return to the ER, urgent care, primary care provider in 3 days, 7 days, 14 days to get the rest of the rabies vaccine.

## 2022-07-22 ENCOUNTER — Ambulatory Visit (HOSPITAL_COMMUNITY)
Admission: EM | Admit: 2022-07-22 | Discharge: 2022-07-22 | Disposition: A | Discharge status: Home / Self Care | Payer: BC Managed Care – PPO | Attending: Internal Medicine | Admitting: Internal Medicine

## 2022-07-22 DIAGNOSIS — Z203 Contact with and (suspected) exposure to rabies: Secondary | ICD-10-CM

## 2022-07-22 MED ORDER — RABIES VACCINE, PCEC IM SUSR
1.0000 mL | Freq: Once | INTRAMUSCULAR | Status: AC
  Administered 2022-07-22: 1 mL via INTRAMUSCULAR

## 2022-07-22 MED ORDER — RABIES VACCINE, PCEC IM SUSR
INTRAMUSCULAR | Status: AC
  Filled 2022-07-22: qty 1

## 2022-07-22 NOTE — ED Triage Notes (Signed)
Pt here for 2nd rabies injection. Pt requesting bandage change to due dirty bandage. Pt educated on wound care. No drainage or redness noted at this time. Appointments made for future rabies injections and suture removal. All questions answered.

## 2022-07-26 ENCOUNTER — Ambulatory Visit (HOSPITAL_COMMUNITY): Payer: BC Managed Care – PPO

## 2022-07-26 ENCOUNTER — Ambulatory Visit
Admission: EM | Admit: 2022-07-26 | Discharge: 2022-07-26 | Disposition: A | Discharge status: Home / Self Care | Payer: BC Managed Care – PPO | Attending: Family Medicine | Admitting: Family Medicine

## 2022-07-26 ENCOUNTER — Other Ambulatory Visit: Payer: Self-pay

## 2022-07-26 VITALS — BP 119/76 | HR 99 | Temp 98.2°F | Resp 18

## 2022-07-26 DIAGNOSIS — Z23 Encounter for immunization: Secondary | ICD-10-CM | POA: Diagnosis not present

## 2022-07-26 DIAGNOSIS — L089 Local infection of the skin and subcutaneous tissue, unspecified: Secondary | ICD-10-CM

## 2022-07-26 DIAGNOSIS — Z203 Contact with and (suspected) exposure to rabies: Secondary | ICD-10-CM | POA: Diagnosis not present

## 2022-07-26 DIAGNOSIS — T148XXA Other injury of unspecified body region, initial encounter: Secondary | ICD-10-CM | POA: Diagnosis not present

## 2022-07-26 MED ORDER — MUPIROCIN 2 % EX OINT: 1.0000 | TOPICAL_OINTMENT | Freq: Two times a day (BID) | CUTANEOUS | 0 refills | Status: DC

## 2022-07-26 MED ORDER — HYDROCODONE-ACETAMINOPHEN 5-325 MG PO TABS: 1.0000 | ORAL_TABLET | Freq: Four times a day (QID) | ORAL | 0 refills | Status: DC | PRN

## 2022-07-26 MED ORDER — RABIES VACCINE, PCEC IM SUSR
1.0000 mL | Freq: Once | INTRAMUSCULAR | Status: AC
  Administered 2022-07-26: 1 mL via INTRAMUSCULAR

## 2022-07-26 MED ORDER — DOXYCYCLINE HYCLATE 100 MG PO CAPS: 100.0000 mg | ORAL_CAPSULE | Freq: Two times a day (BID) | ORAL | 0 refills | Status: AC

## 2022-07-26 NOTE — Discharge Instructions (Signed)
Take doxycycline 100 mg --1 capsule 2 times daily for 7 days Still finish taking your amoxicillin-clavulanate  Continue to clean the wounds twice daily with either soapy water or peroxide.  Put mupirocin ointment on the sore areas twice daily until improved  Hydrocodone 5 mg--1 tablet every 6 hours as needed for pain.  This is best taken with food.  It can cause sleepiness or dizziness

## 2022-07-26 NOTE — ED Triage Notes (Signed)
Pt here for wound check and rabies vaccination; pt with bite wound to left lower leg from dog bite 8 days ago

## 2022-07-26 NOTE — ED Provider Notes (Signed)
[name]    CSN: [id number] Arrival date & time: 07/26/22  4098      History   Chief Complaint Chief Complaint  Patient presents with   Wound Check    HPI Amy Guerrero is a 42 y.o. female.    Wound Check   Here for bite wounds that have become more painful in the last 2-3 days.  On June 2 she was seen for a dog bite of her left lower leg and buttock.  Loose closures were done of several of the wounds that were extensive.  She is taking Augmentin; no fever or chills  She been prescribed hydrocodone when the bites first happened but she is out and the pain is worsened again.  Ibuprofen is not helping    Past Medical History:  Diagnosis Date   Asthma    Sinusitis     Patient Active Problem List   Diagnosis Date Noted   Essential hypertension 05/07/2020   Anxiety and depression 05/07/2020    Past Surgical History:  Procedure Laterality Date   CARPAL TUNNEL RELEASE Right    TUBAL LIGATION     tubes tied      OB History   No obstetric history on file.      Home Medications    Prior to Admission medications   Medication Sig Start Date End Date Taking? Authorizing Provider  doxycycline (VIBRAMYCIN) 100 MG capsule Take 1 capsule (100 mg total) by mouth 2 (two) times daily for 7 days. 07/26/22 08/02/22 Yes Jodey Burbano, Janace Aris, MD  HYDROcodone-acetaminophen (NORCO/VICODIN) 5-325 MG tablet Take 1 tablet by mouth every 6 (six) hours as needed (pain). 07/26/22  Yes Zenia Resides, MD  mupirocin ointment (BACTROBAN) 2 % Apply 1 Application topically 2 (two) times daily. To affected area till better 07/26/22  Yes [place] Markgraf, Janace Aris, MD  albuterol (PROVENTIL) (2.5 MG/3ML) 0.083% nebulizer solution Take 3 mLs (2.5 mg total) by nebulization every 6 (six) hours as needed for wheezing or shortness of breath. 09/22/20   Wallis Bamberg, PA-C  albuterol (VENTOLIN HFA) 108 (90 Base) MCG/ACT inhaler Inhale 2 puffs into the lungs every 6 (six) hours as needed for  wheezing or shortness of breath. 09/22/20   Wallis Bamberg, PA-C  amoxicillin-clavulanate (AUGMENTIN) 875-125 MG tablet Take 1 tablet by mouth every 12 (twelve) hours for 10 days. 07/19/22 07/29/22  Netta Corrigan, PA-C  Cetirizine HCl 10 MG CAPS Take 1 capsule (10 mg total) by mouth daily. 12/26/21 03/26/22  Rema Fendt, NP  cyclobenzaprine (FLEXERIL) 5 MG tablet Take 1 tablet (5 mg total) by mouth 2 (two) times daily as needed for muscle spasms. 10/02/21   Gustavus Bryant, FNP  hydrocortisone (ANUSOL-HC) 2.5 % rectal cream Place 1 application rectally 2 (two) times daily. 07/17/20   Rema Fendt, NP  meloxicam (MOBIC) 15 MG tablet Take 1 tablet (15 mg total) by mouth daily. 01/22/21   Hudnall, Azucena Fallen, MD  ondansetron (ZOFRAN-ODT) 8 MG disintegrating tablet Take 1 tablet (8 mg total) by mouth every 8 (eight) hours as needed for nausea. 02/10/21   Derwood Kaplan, MD  Spacer/Aero-Holding Chambers (AEROCHAMBER PLUS) inhaler Use as instructed 05/07/20   Rema Fendt, NP  dicyclomine (BENTYL) 20 MG tablet Take 1 tablet (20 mg total) by mouth 4 (four) times daily -  before meals and at bedtime. Patient not taking: Reported on 07/17/2020 06/18/20 07/22/20  Wieters, Hallie C, PA-C  famotidine (PEPCID) 40 MG tablet Take 1 tablet (40  mg total) by mouth daily. Patient not taking: Reported on 07/17/2020 06/18/20 07/22/20  Wieters, Hallie C, PA-C  lisinopril (ZESTRIL) 5 MG tablet Take 1 tablet (5 mg total) by mouth daily. Patient not taking: Reported on 07/17/2020 05/07/20 07/22/20  Rema Fendt, NP  sertraline (ZOLOFT) 25 MG tablet Take 1 tablet (25 mg total) by mouth at bedtime. Patient not taking: Reported on 07/17/2020 05/07/20 07/22/20  Rema Fendt, NP    Family History Family History  Problem Relation Age of Onset   Diabetes Mother    Diabetes Father     Social History Social History   Tobacco Use   Smoking status: Never   Smokeless tobacco: Never  Vaping Use   Vaping Use: Former  Substance Use Topics    Alcohol use: Yes    Alcohol/week: 1.0 standard drink of alcohol    Types: 1 Glasses of wine per week    Comment: occassionally   Drug use: Yes    Types: Marijuana     Allergies   Patient has no known allergies.   Review of Systems Review of Systems   Physical Exam Triage Vital Signs ED Triage Vitals  Enc Vitals Group     BP 07/26/22 0923 119/76     Pulse Rate 07/26/22 0923 99     Resp 07/26/22 0923 18     Temp 07/26/22 0923 98.2 F (36.8 C)     Temp Source 07/26/22 0923 Oral     SpO2 07/26/22 0923 98 %     Weight --      Height --      Head Circumference --      Peak Flow --      Pain Score 07/26/22 0924 8     Pain Loc --      Pain Edu? --      Excl. in GC? --    No data found.  Updated Vital Signs BP 119/76 (BP Location: Right Arm)   Pulse 99   Temp 98.2 F (36.8 C) (Oral)   Resp 18   SpO2 98%   Visual Acuity Right Eye Distance:   Left Eye Distance:   Bilateral Distance:    Right Eye Near:   Left Eye Near:    Bilateral Near:     Physical Exam Vitals reviewed.  Constitutional:      General: She is not in acute distress.    Appearance: She is not ill-appearing, toxic-appearing or diaphoretic.  Skin:    Coloration: Skin is not jaundiced or pale.     Comments: There is tenderness of the calf and the anterior left lower leg.  The wound beds have a yellow goopy appearance.  There is no induration or erythema around the wounds.  Neurological:     Mental Status: She is alert and oriented to person, place, and time.  Psychiatric:        Behavior: Behavior normal.      UC Treatments / Results  Labs (all labs ordered are listed, but only abnormal results are displayed) Labs Reviewed - No data to display  EKG   Radiology No results found.  Procedures Procedures (including critical care time)  Medications Ordered in UC Medications  rabies vaccine (RABAVERT) injection 1 mL (1 mL Intramuscular Given 07/26/22 0915)    Initial Impression /  Assessment and Plan / UC Course  I have reviewed the triage vital signs and the nursing notes.  Pertinent labs & imaging results that were available during my  care of the patient were reviewed by me and considered in my medical decision making (see chart for details).        She will finish the Augmentin, but I am sending in doxycycline and Bactroban to cover resistant staph.  Wound care is discussed.  She will follow-up with her primary care.  Also since this is really a new problem with the wound infection, hydrocodone is sent in for the pain. Final Clinical Impressions(s) / UC Diagnoses   Final diagnoses:  Rabies exposure  Post-traumatic wound infection     Discharge Instructions      Take doxycycline 100 mg --1 capsule 2 times daily for 7 days Still finish taking your amoxicillin-clavulanate  Continue to clean the wounds twice daily with either soapy water or peroxide.  Put mupirocin ointment on the sore areas twice daily until improved  Hydrocodone 5 mg--1 tablet every 6 hours as needed for pain.  This is best taken with food.  It can cause sleepiness or dizziness       ED Prescriptions     Medication Sig Dispense Auth. Provider   doxycycline (VIBRAMYCIN) 100 MG capsule Take 1 capsule (100 mg total) by mouth 2 (two) times daily for 7 days. 14 capsule Zenia Resides, MD   mupirocin ointment (BACTROBAN) 2 % Apply 1 Application topically 2 (two) times daily. To affected area till better 22 g Zenia Resides, MD   HYDROcodone-acetaminophen (NORCO/VICODIN) 5-325 MG tablet Take 1 tablet by mouth every 6 (six) hours as needed (pain). 12 tablet [address], Janace Aris, MD      I have reviewed the PDMP during this encounter.   Zenia Resides, MD 07/26/22 [phone]

## 2022-07-29 ENCOUNTER — Ambulatory Visit: Payer: BC Managed Care – PPO

## 2022-07-30 NOTE — Progress Notes (Addendum)
Patient ID: Amy Guerrero, female    DOB: 08-Feb-1981  MRN: [id number]  CC: Urgent Care Follow-Up   Subjective: Amy Guerrero is a 41 y.o. female who presents for Urgent Care follow-up. She is accompanied by her significant other.   Her concerns today include:  Patient initially seen on 07/19/2022 at the [facility] Emergency Department for a dog bite. Patient was then seen on 07/26/2022 at [facility] for rabies exposure and post-traumatic wound infection. Since then patient reports left lower extremity still in pain. Reports left foot 4th toe numbness. Reports she is taking antibiotics as prescribed. Reports Hydrocodone not helping with pain. Using over-the-counter Tylenol PM to help instead. Reports she has an appointment scheduled for rabies follow-up at the Urgent Care on 08/02/2022 and 08/16/2022. Reports she will need disability paperwork completed due to her being unable to work because of her injury. States she will have paperwork faxed to our office at a later date. I discussed with patient in detail paperwork completion will require an appointment. Patient verbalized agreement/understanding. Needs refills on Albuterol inhaler/nebulizer and nebulizer machine. No further issues/concerns for discussion today.   Patient Active Problem List   Diagnosis Date Noted   Essential hypertension 05/07/2020   Anxiety and depression 05/07/2020     Current Outpatient Medications on File Prior to Visit  Medication Sig Dispense Refill   doxycycline (VIBRAMYCIN) 100 MG capsule Take 1 capsule (100 mg total) by mouth 2 (two) times daily for 7 days. 14 capsule 0   hydrocortisone (ANUSOL-HC) 2.5 % rectal cream Place 1 application rectally 2 (two) times daily. 84 g 0   mupirocin ointment (BACTROBAN) 2 % Apply 1 Application topically 2 (two) times daily. To affected area till better 22 g 0   Cetirizine HCl 10 MG CAPS Take 1 capsule (10 mg total) by mouth daily. 90 capsule 0    cyclobenzaprine (FLEXERIL) 5 MG tablet Take 1 tablet (5 mg total) by mouth 2 (two) times daily as needed for muscle spasms. (Patient not taking: Reported on 07/31/2022) 20 tablet 0   HYDROcodone-acetaminophen (NORCO/VICODIN) 5-325 MG tablet Take 1 tablet by mouth every 6 (six) hours as needed (pain). (Patient not taking: Reported on 07/31/2022) 12 tablet 0   meloxicam (MOBIC) 15 MG tablet Take 1 tablet (15 mg total) by mouth daily. (Patient not taking: Reported on 07/31/2022) 30 tablet 2   ondansetron (ZOFRAN-ODT) 8 MG disintegrating tablet Take 1 tablet (8 mg total) by mouth every 8 (eight) hours as needed for nausea. (Patient not taking: Reported on 07/31/2022) 10 tablet 0   Spacer/Aero-Holding Chambers (AEROCHAMBER PLUS) inhaler Use as instructed (Patient not taking: Reported on 07/31/2022) 1 each 2   [DISCONTINUED] dicyclomine (BENTYL) 20 MG tablet Take 1 tablet (20 mg total) by mouth 4 (four) times daily -  before meals and at bedtime. (Patient not taking: Reported on 07/17/2020) 20 tablet 0   [DISCONTINUED] famotidine (PEPCID) 40 MG tablet Take 1 tablet (40 mg total) by mouth daily. (Patient not taking: Reported on 07/17/2020) 20 tablet 0   [DISCONTINUED] lisinopril (ZESTRIL) 5 MG tablet Take 1 tablet (5 mg total) by mouth daily. (Patient not taking: Reported on 07/17/2020) 30 tablet 1   [DISCONTINUED] sertraline (ZOLOFT) 25 MG tablet Take 1 tablet (25 mg total) by mouth at bedtime. (Patient not taking: Reported on 07/17/2020) 30 tablet 0   No current facility-administered medications on file prior to visit.    No Known Allergies  Social History   Socioeconomic  History   Marital status: Single    Spouse name: Not on file   Number of children: Not on file   Years of education: Not on file   Highest education level: Not on file  Occupational History   Not on file  Tobacco Use   Smoking status: Never   Smokeless tobacco: Never  Vaping Use   Vaping Use: Former  Substance and Sexual Activity    Alcohol use: Yes    Alcohol/week: 1.0 standard drink of alcohol    Types: 1 Glasses of wine per week    Comment: occassionally   Drug use: Yes    Types: Marijuana   Sexual activity: Not on file  Other Topics Concern   Not on file  Social History Narrative   Not on file   Social Determinants of Health   Financial Resource Strain: Not on file  Food Insecurity: Not on file  Transportation Needs: Not on file  Physical Activity: Not on file  Stress: Not on file  Social Connections: Not on file  Intimate Partner Violence: Not on file    Family History  Problem Relation Age of Onset   Diabetes Mother    Diabetes Father     Past Surgical History:  Procedure Laterality Date   CARPAL TUNNEL RELEASE Right    TUBAL LIGATION     tubes tied      ROS: Review of Systems Negative except as stated above  PHYSICAL EXAM: BP 110/78   Pulse (!) 115   Ht 5\' 1"  (1.549 m)   Wt 154 lb (69.9 kg)   SpO2 96%   BMI 29.10 kg/m    Physical Exam HENT:     Head: Normocephalic and atraumatic.     Nose: Nose normal.     Mouth/Throat:     Mouth: Mucous membranes are moist.     Pharynx: Oropharynx is clear.  Eyes:     Extraocular Movements: Extraocular movements intact.     Conjunctiva/sclera: Conjunctivae normal.     Pupils: Pupils are equal, round, and reactive to light.  Cardiovascular:     Rate and Rhythm: Tachycardia present.     Pulses: Normal pulses.     Heart sounds: Normal heart sounds.  Pulmonary:     Effort: Pulmonary effort is normal.     Breath sounds: Normal breath sounds.  Musculoskeletal:     Right shoulder: Normal.     Left shoulder: Normal.     Right upper arm: Normal.     Left upper arm: Normal.     Right elbow: Normal.     Left elbow: Normal.     Right forearm: Normal.     Left forearm: Normal.     Right wrist: Normal.     Left wrist: Normal.     Right hand: Normal.     Left hand: Normal.     Cervical back: Normal, normal range of motion and neck supple.      Thoracic back: Normal.     Lumbar back: Normal.     Right hip: Normal.     Left hip: Normal.     Right upper leg: Normal.     Left upper leg: Normal.     Right knee: Normal.     Left knee: Normal.     Right ankle: Normal.     Left ankle: Swelling present.     Right foot: Normal.     Left foot: Swelling present.     Comments: +1  edema left ankle and left foot with no additional presentation. Lacerations of left lower extremity with red/pink base, no drainage, sutures in place, no additional presentation.   Skin:    General: Skin is warm and dry.  Neurological:     General: No focal deficit present.     Mental Status: She is alert and oriented to person, place, and time.  Psychiatric:        Mood and Affect: Mood normal.        Behavior: Behavior normal.     ASSESSMENT AND PLAN: Note - I discussed plan of care with Georganna Skeans, MD.   1. Dog bite, sequela 2. Rabies exposure 3. Post-traumatic wound infection - Gabapentin as prescribed. Counseled on medication adherence/adverse effects.  - Left lower extremity lacerations do not appear completely healed. Will keep sutures in place until upcoming rabies follow-up appointment scheduled with Urgent Care on 08/02/2022. At that time provider can evaluate to see if appropriate for suture removal. - Keep all scheduled appointments with Urgent Care for rabies exposure follow-up. - Dressing change today in office by Sabino Snipes, CMA. - Follow-up with primary provider as scheduled. - gabapentin (NEURONTIN) 300 MG capsule; Take 1 capsule (300 mg total) by mouth 3 (three) times daily.  Dispense: 90 capsule; Refill: 0  4. Mild intermittent asthma without complication - Continue Albuterol inhaler and Albuterol nebulizer as prescribed.  - Follow-up with primary provider as scheduled.  - albuterol (PROVENTIL) (2.5 MG/3ML) 0.083% nebulizer solution; Take 3 mLs (2.5 mg total) by nebulization every 6 (six) hours as needed for wheezing or  shortness of breath.  Dispense: 150 mL; Refill: 2 - albuterol (VENTOLIN HFA) 108 (90 Base) MCG/ACT inhaler; Inhale 2 puffs into the lungs every 6 (six) hours as needed for wheezing or shortness of breath.  Dispense: 8 g; Refill: 2 - For home use only DME Nebulizer machine   Patient was given the opportunity to ask questions.  Patient verbalized understanding of the plan and was able to repeat key elements of the plan. Patient was given clear instructions to go to Emergency Department or return to medical center if symptoms don't improve, worsen, or new problems develop.The patient verbalized understanding.   Orders Placed This Encounter  Procedures   For home use only DME Nebulizer machine    Requested Prescriptions   Signed Prescriptions Disp Refills   gabapentin (NEURONTIN) 300 MG capsule 90 capsule 0    Sig: Take 1 capsule (300 mg total) by mouth 3 (three) times daily.   albuterol (PROVENTIL) (2.5 MG/3ML) 0.083% nebulizer solution 150 mL 2    Sig: Take 3 mLs (2.5 mg total) by nebulization every 6 (six) hours as needed for wheezing or shortness of breath.   albuterol (VENTOLIN HFA) 108 (90 Base) MCG/ACT inhaler 8 g 2    Sig: Inhale 2 puffs into the lungs every 6 (six) hours as needed for wheezing or shortness of breath.    Follow-up with primary provider as scheduled.   Rema Fendt, NP

## 2022-07-31 ENCOUNTER — Ambulatory Visit (INDEPENDENT_AMBULATORY_CARE_PROVIDER_SITE_OTHER): Payer: BC Managed Care – PPO | Admitting: Family

## 2022-07-31 VITALS — BP 110/78 | HR 115 | Ht 61.0 in | Wt 154.0 lb

## 2022-07-31 DIAGNOSIS — J4520 Mild intermittent asthma, uncomplicated: Secondary | ICD-10-CM

## 2022-07-31 DIAGNOSIS — W540XXS Bitten by dog, sequela: Secondary | ICD-10-CM

## 2022-07-31 DIAGNOSIS — T148XXA Other injury of unspecified body region, initial encounter: Secondary | ICD-10-CM | POA: Diagnosis not present

## 2022-07-31 DIAGNOSIS — L089 Local infection of the skin and subcutaneous tissue, unspecified: Secondary | ICD-10-CM

## 2022-07-31 DIAGNOSIS — Z203 Contact with and (suspected) exposure to rabies: Secondary | ICD-10-CM

## 2022-07-31 MED ORDER — GABAPENTIN 300 MG PO CAPS
300.0000 mg | ORAL_CAPSULE | Freq: Three times a day (TID) | ORAL | 0 refills | Status: DC
Start: 2022-07-31 — End: 2022-09-04

## 2022-07-31 MED ORDER — ALBUTEROL SULFATE (2.5 MG/3ML) 0.083% IN NEBU
2.5000 mg | INHALATION_SOLUTION | Freq: Four times a day (QID) | RESPIRATORY_TRACT | 2 refills | Status: AC | PRN
Start: 2022-07-31 — End: ?

## 2022-07-31 MED ORDER — ALBUTEROL SULFATE HFA 108 (90 BASE) MCG/ACT IN AERS
2.0000 | INHALATION_SPRAY | Freq: Four times a day (QID) | RESPIRATORY_TRACT | 2 refills | Status: DC | PRN
Start: 2022-07-31 — End: 2023-02-08

## 2022-07-31 NOTE — Progress Notes (Signed)
PT is here for hos f/u  PT requesting stitch removal, today is day 10 when she is supposed to have it removed.  PT complaining of stabbing pain to her left leg   PT numbness to left foot.  PT needs refill on inhaler and albuterol for neb machine.

## 2022-08-02 ENCOUNTER — Ambulatory Visit
Admission: EM | Admit: 2022-08-02 | Discharge: 2022-08-02 | Disposition: A | Discharge status: Home / Self Care | Payer: BC Managed Care – PPO | Attending: Internal Medicine | Admitting: Internal Medicine

## 2022-08-02 VITALS — BP 112/76 | HR 92 | Temp 99.0°F | Resp 18

## 2022-08-02 DIAGNOSIS — M79605 Pain in left leg: Secondary | ICD-10-CM

## 2022-08-02 DIAGNOSIS — Z203 Contact with and (suspected) exposure to rabies: Secondary | ICD-10-CM | POA: Diagnosis not present

## 2022-08-02 DIAGNOSIS — L02412 Cutaneous abscess of left axilla: Secondary | ICD-10-CM | POA: Diagnosis not present

## 2022-08-02 DIAGNOSIS — W540XXD Bitten by dog, subsequent encounter: Secondary | ICD-10-CM

## 2022-08-02 MED ORDER — SULFAMETHOXAZOLE-TRIMETHOPRIM 800-160 MG PO TABS: 1.0000 | ORAL_TABLET | Freq: Two times a day (BID) | ORAL | 0 refills | Status: AC

## 2022-08-02 MED ORDER — RABIES VACCINE, PCEC IM SUSR
1.0000 mL | Freq: Once | INTRAMUSCULAR | Status: AC
  Administered 2022-08-02: 1 mL via INTRAMUSCULAR

## 2022-08-02 MED ORDER — MUPIROCIN 2 % EX OINT: 1.0000 | TOPICAL_OINTMENT | Freq: Two times a day (BID) | CUTANEOUS | 0 refills | Status: DC

## 2022-08-02 NOTE — ED Triage Notes (Addendum)
Patient presents to urgent care for 4th rabies vaccine. The patient states the areas where she was bitten are healing but she still has numbness to the left foot and is not able to fully flex the area (patient using crutches). Wounds still have a scant amount of bloody drainage and are open.   Patient denies issues with prior rabies injection.  Patient reports having an abscess to left axillary region. Home interventions: none

## 2022-08-02 NOTE — ED Notes (Signed)
Loose sutures removed per providers order to back of pt leg (1 suture) and to front of leg (2 sutures)

## 2022-08-02 NOTE — Discharge Instructions (Signed)
I have placed a referral to orthopedics for follow-up given persistent leg pain and numbness.  If they do not call you soon, please call them yourself at provided contact information.  Continue dressing changes and monitor for signs of infection as you have been.  I have prescribed an antibiotic that you will take for abscess to your armpit.  Use warm compresses and change dressing daily.  It is recommended that you take a gastrointestinal probiotic or eat yogurt given that you have been on multiple antibiotics recently.

## 2022-08-02 NOTE — ED Provider Notes (Addendum)
[name]    CSN: [id number] Arrival date & time: 08/02/22  0851      History   Chief Complaint Chief Complaint  Patient presents with   Rabies Injection   Abscess    HPI Amy Guerrero is a 42 y.o. female.   Patient presents today for 3 different chief complaints.  Patient reports that she is here to get a rabies injection.  She also reports that she wants her wounds evaluated from previous dog bite on 07/19/2022. She has been evaluated multiple times for dog bite in ED, urgent care, and PCP.  Reports that they appears to be healing well but she is concerned given that she has been having persistent pain, numbness, decreased range of motion of the foot and ankle since accident occurred.  She states that she has numbness starts at her heel and radiates around to her left ankle.  She reports that she is having difficulty with flexion of the ankle and foot as well.  States that she has to walk with crutches given that she will "fall over" without them.  She was treated with Augmentin and doxycycline which she has now completed.  She has been following up with PCP.  Reports that the PCP did not want to remove sutures at previous visit a few days prior given that they were concerned that it was not ready.  Denies any fever.  Patient also reporting that she has an abscess to her left armpit.  States that this is a recurrent issue for her.  Denies any purulent drainage or fever from the area.   Abscess   Past Medical History:  Diagnosis Date   Asthma    Sinusitis     Patient Active Problem List   Diagnosis Date Noted   Essential hypertension 05/07/2020   Anxiety and depression 05/07/2020    Past Surgical History:  Procedure Laterality Date   CARPAL TUNNEL RELEASE Right    TUBAL LIGATION     tubes tied      OB History   No obstetric history on file.      Home Medications    Prior to Admission medications   Medication Sig Start Date End Date Taking?  Authorizing Provider  sulfamethoxazole-trimethoprim (BACTRIM DS) 800-160 MG tablet Take 1 tablet by mouth 2 (two) times daily for 5 days. 08/02/22 08/07/22 Yes Monterrio Gerst, Acie Fredrickson, FNP  albuterol (PROVENTIL) (2.5 MG/3ML) 0.083% nebulizer solution Take 3 mLs (2.5 mg total) by nebulization every 6 (six) hours as needed for wheezing or shortness of breath. 07/31/22   Rema Fendt, NP  albuterol (VENTOLIN HFA) 108 (90 Base) MCG/ACT inhaler Inhale 2 puffs into the lungs every 6 (six) hours as needed for wheezing or shortness of breath. 07/31/22   Rema Fendt, NP  Cetirizine HCl 10 MG CAPS Take 1 capsule (10 mg total) by mouth daily. 12/26/21 03/26/22  Rema Fendt, NP  cyclobenzaprine (FLEXERIL) 5 MG tablet Take 1 tablet (5 mg total) by mouth 2 (two) times daily as needed for muscle spasms. Patient not taking: Reported on 07/31/2022 10/02/21   Gustavus Bryant, FNP  doxycycline (VIBRAMYCIN) 100 MG capsule Take 1 capsule (100 mg total) by mouth 2 (two) times daily for 7 days. 07/26/22 08/02/22  Zenia Resides, MD  gabapentin (NEURONTIN) 300 MG capsule Take 1 capsule (300 mg total) by mouth 3 (three) times daily. 07/31/22   Rema Fendt, NP  HYDROcodone-acetaminophen (NORCO/VICODIN) 5-325 MG tablet Take 1 tablet by  mouth every 6 (six) hours as needed (pain). Patient not taking: Reported on 07/31/2022 07/26/22   Zenia Resides, MD  hydrocortisone (ANUSOL-HC) 2.5 % rectal cream Place 1 application rectally 2 (two) times daily. 07/17/20   Rema Fendt, NP  meloxicam (MOBIC) 15 MG tablet Take 1 tablet (15 mg total) by mouth daily. Patient not taking: Reported on 07/31/2022 01/22/21   Lenda Kelp, MD  mupirocin ointment (BACTROBAN) 2 % Apply 1 Application topically 2 (two) times daily. To affected area till better 07/26/22   Zenia Resides, MD  ondansetron (ZOFRAN-ODT) 8 MG disintegrating tablet Take 1 tablet (8 mg total) by mouth every 8 (eight) hours as needed for nausea. Patient not taking: Reported on  07/31/2022 02/10/21   Derwood Kaplan, MD  Spacer/Aero-Holding Chambers (AEROCHAMBER PLUS) inhaler Use as instructed Patient not taking: Reported on 07/31/2022 05/07/20   Rema Fendt, NP  dicyclomine (BENTYL) 20 MG tablet Take 1 tablet (20 mg total) by mouth 4 (four) times daily -  before meals and at bedtime. Patient not taking: Reported on 07/17/2020 06/18/20 07/22/20  Wieters, Hallie C, PA-C  famotidine (PEPCID) 40 MG tablet Take 1 tablet (40 mg total) by mouth daily. Patient not taking: Reported on 07/17/2020 06/18/20 07/22/20  Wieters, Hallie C, PA-C  lisinopril (ZESTRIL) 5 MG tablet Take 1 tablet (5 mg total) by mouth daily. Patient not taking: Reported on 07/17/2020 05/07/20 07/22/20  Rema Fendt, NP  sertraline (ZOLOFT) 25 MG tablet Take 1 tablet (25 mg total) by mouth at bedtime. Patient not taking: Reported on 07/17/2020 05/07/20 07/22/20  Rema Fendt, NP    Family History Family History  Problem Relation Age of Onset   Diabetes Mother    Diabetes Father     Social History Social History   Tobacco Use   Smoking status: Never   Smokeless tobacco: Never  Vaping Use   Vaping Use: Former  Substance Use Topics   Alcohol use: Yes    Alcohol/week: 1.0 standard drink of alcohol    Types: 1 Glasses of wine per week    Comment: occassionally   Drug use: Yes    Types: Marijuana     Allergies   Patient has no known allergies.   Review of Systems Review of Systems Per HPI  Physical Exam Triage Vital Signs ED Triage Vitals  Enc Vitals Group     BP 08/02/22 0903 112/76     Pulse Rate 08/02/22 0903 92     Resp 08/02/22 0903 18     Temp 08/02/22 0903 99 F (37.2 C)     Temp Source 08/02/22 0903 Oral     SpO2 08/02/22 0903 98 %     Weight --      Height --      Head Circumference --      Peak Flow --      Pain Score 08/02/22 0902 6     Pain Loc --      Pain Edu? --      Excl. in GC? --    No data found.  Updated Vital Signs BP 112/76 (BP Location: Left Arm)   Pulse 92    Temp 99 F (37.2 C) (Oral)   Resp 18   LMP 06/23/2022   SpO2 98%   Visual Acuity Right Eye Distance:   Left Eye Distance:   Bilateral Distance:    Right Eye Near:   Left Eye Near:    Bilateral Near:  Physical Exam Constitutional:      General: She is not in acute distress.    Appearance: Normal appearance. She is not toxic-appearing or diaphoretic.  HENT:     Head: Normocephalic and atraumatic.  Eyes:     Extraocular Movements: Extraocular movements intact.     Conjunctiva/sclera: Conjunctivae normal.  Pulmonary:     Effort: Pulmonary effort is normal.  Musculoskeletal:     Comments: Patient is having difficulty with plantar flexion and dorsiflexion of the foot given limited range of motion.  Minimal movement noted.  Skin:    Comments: Has several different healing wounds present to anterior shin and one to calf.  Wound beds appear to be healing well but are still open.  No obvious purulent drainage noted.  No surrounding swelling.  Sutures are in place to 2 lacerations with 1 being on the shin and one being on the posterior calf.  No bleeding noted.  Capillary refill and pulses intact.  Patient has a very small fluctuant abscess present to left axilla that is approximately 1 cm in diameter.  Neurological:     General: No focal deficit present.     Mental Status: She is alert and oriented to person, place, and time. Mental status is at baseline.  Psychiatric:        Mood and Affect: Mood normal.        Behavior: Behavior normal.        Thought Content: Thought content normal.        Judgment: Judgment normal.      UC Treatments / Results  Labs (all labs ordered are listed, but only abnormal results are displayed) Labs Reviewed - No data to display  EKG   Radiology No results found.  Procedures Procedures (including critical care time)  Medications Ordered in UC Medications  rabies vaccine (RABAVERT) injection 1 mL (has no administration in time range)     Initial Impression / Assessment and Plan / UC Course  I have reviewed the triage vital signs and the nursing notes.  Pertinent labs & imaging results that were available during my care of the patient were reviewed by me and considered in my medical decision making (see chart for details).     1.  Dog bite  Wounds appear to be healing well.  Given sutures have been in place for about 2 weeks, will opt to remove them today so the skin does not grow around them.  Sutures removed by clinical staff. Wounds are not fully closed so encouraged patient to keep dressings over them and change dressings daily and as needed.  She has mupirocin ointment which she has been applying as well which I do think is reasonable.  Given limited range of motion of foot and leg, recommended that she see orthopedist for further evaluation and management so ambulatory referral to orthopedics was placed for patient.  Advised patient that if they do not call her within the next few days, she is to call them herself at provided contact information.  No concerns for osteomyelitis or worsening infection on exam so do not think that emergent evaluation is necessary.  She states that she has an appointment with PCP in about 5 days as well.  Clinical staff applied dressings to legs prior to discharge. Rabies series injection administered by clinical staff today. Patient requested refill on mupirocin ointment so this was refilled as well.   2.  Axillary abscess Axillary abscess was drained with 18-gauge needle given that it  was very small with small amount of purulent drainage.  Dressing applied and patient was advised of daily dressing changes.  Discussed with patient multiple antibiotic use recently for dog bite and that it may be necessary to do additional antibiotics given axillary abscess.  She was agreeable with this so will prescribe Bactrim.  Although, discussed taking a stomach probiotic versus eating yogurt to help alleviate  any gastrointestinal adverse effects.  Discussed strict return and ER precautions for all chief complaints today.  Patient verbalized understanding and was agreeable with plan. Coding this as a level 4 given multiple chief complaints addressed today.  Final Clinical Impressions(s) / UC Diagnoses   Final diagnoses:  Dog bite, subsequent encounter  Abscess of axilla, left  Left leg pain     Discharge Instructions      I have placed a referral to orthopedics for follow-up given persistent leg pain and numbness.  If they do not call you soon, please call them yourself at provided contact information.  Continue dressing changes and monitor for signs of infection as you have been.  I have prescribed an antibiotic that you will take for abscess to your armpit.  Use warm compresses and change dressing daily.  It is recommended that you take a gastrointestinal probiotic or eat yogurt given that you have been on multiple antibiotics recently.    ED Prescriptions     Medication Sig Dispense Auth. Provider   sulfamethoxazole-trimethoprim (BACTRIM DS) 800-160 MG tablet Take 1 tablet by mouth 2 (two) times daily for 5 days. 10 tablet Gustavus Bryant, [place]      PDMP not reviewed this encounter.   Gustavus Bryant, [place] 08/02/22 1004    [address] [place] 08/02/22 1029

## 2022-08-10 ENCOUNTER — Telehealth: Payer: Self-pay

## 2022-08-10 NOTE — Telephone Encounter (Signed)
Spoke with PT on phone, Amy Guerrero assisting paper work to have completed by Amy on or before 08/12/2022.

## 2022-08-11 ENCOUNTER — Telehealth: Payer: Self-pay | Admitting: Family

## 2022-08-11 NOTE — Telephone Encounter (Signed)
Sent signed documents via email. Email used [email or web address]

## 2022-08-12 ENCOUNTER — Encounter: Payer: Self-pay | Admitting: Family

## 2022-08-12 ENCOUNTER — Ambulatory Visit: Payer: BC Managed Care – PPO | Admitting: Family

## 2022-08-12 ENCOUNTER — Other Ambulatory Visit: Payer: Self-pay | Admitting: Family

## 2022-08-12 ENCOUNTER — Ambulatory Visit (INDEPENDENT_AMBULATORY_CARE_PROVIDER_SITE_OTHER): Payer: BC Managed Care – PPO | Admitting: Family

## 2022-08-12 ENCOUNTER — Telehealth: Payer: Self-pay

## 2022-08-12 DIAGNOSIS — W540XXA Bitten by dog, initial encounter: Secondary | ICD-10-CM

## 2022-08-12 DIAGNOSIS — L089 Local infection of the skin and subcutaneous tissue, unspecified: Secondary | ICD-10-CM

## 2022-08-12 MED ORDER — AMOXICILLIN-POT CLAVULANATE 500-125 MG PO TABS: 1.0000 | ORAL_TABLET | Freq: Three times a day (TID) | ORAL | 0 refills | Status: DC

## 2022-08-12 MED ORDER — DOXYCYCLINE HYCLATE 100 MG PO TABS: 100.0000 mg | ORAL_TABLET | Freq: Two times a day (BID) | ORAL | 0 refills | Status: DC

## 2022-08-12 NOTE — Telephone Encounter (Signed)
Patient came in to sign last form because we were missing signature, I refaxed paperwork to company and gave patient a copy of paperwork and sent copy to scan center

## 2022-08-12 NOTE — Progress Notes (Signed)
Office Visit Note   Patient: Amy Guerrero           Date of Birth: 1980-08-05           MRN: [id number] Visit Date: 08/12/2022              Requested by: No referring provider defined for this encounter. PCP: Pcp, No  Chief Complaint  Patient presents with   Left Ankle - Wound Check      HPI: The patient is a 42 year old woman who is seen status post ankle bite of the left calf she was seen in urgent care for the same.  Had multiple visits.  She is currently been doing Bactroban dressing changes.  Today she presents concerned for "inability to walk on the leg.  She states she cannot put weight on it she is having a difficult time with dorsiflexion plantarflexion of the ankle.  Complains of tightness.  Denies fevers or chills denies purulence  Has previously completed a course of Augmentin as well as rabies prophylaxis  Assessment & Plan: Visit Diagnoses:  1. Dog bite, initial encounter     Plan: Will proceed with MRI of the left tibia and fibula.  Evaluate for deep abscess.  Follow-Up Instructions: Return in about 1 week (around 08/19/2022).   Ortho Exam  Patient is alert, oriented, no adenopathy, well-dressed, normal affect, normal respiratory effort. On examination of the left lower extremity please see wounds as an attached images.  Overall wound is well-appearing healing well there is no purulence or surrounding erythema she does have moderate edema with mild warmth diffuse tenderness Exam limited by poor effort with dorsiflexion plantarflexion.  Pain with passive dorsiflexion foot plantarflexion  Imaging: No results found.     Labs: Lab Results  Component Value Date   HGBA1C 5.4 07/17/2020   REPTSTATUS 02/06/2021 FINAL 02/03/2021   GRAMSTAIN  05/24/2007    NO WBC SEEN NO SQUAMOUS EPITHELIAL CELLS SEEN NO ORGANISMS SEEN   CULT  02/03/2021    NO GROUP A STREP (S.PYOGENES) ISOLATED Performed at [facility], [address] [place], [place]  [address]      Lab Results  Component Value Date   ALBUMIN 4.4 07/17/2020   ALBUMIN 3.8 06/16/2017   ALBUMIN 2.7 (L) 05/26/2007    Lab Results  Component Value Date   MG 2.2 06/16/2017   No results found for: "VD25OH"  No results found for: "PREALBUMIN"    Latest Ref Rng & Units 07/17/2020    5:02 PM 04/17/2018    6:22 PM 06/16/2017    5:10 PM  CBC EXTENDED  WBC 3.4 - 10.8 x10E3/uL 3.4  2.7  2.8   RBC 3.77 - 5.28 x10E6/uL 5.13  5.16  4.76   Hemoglobin 11.1 - 15.9 g/dL 19.1  47.8  9.5   HCT 29.5 - 46.6 % 41.1  34.8  30.4   Platelets 150 - 450 x10E3/uL 216  236  205   NEUT# 1.7 - 7.7 K/uL  1.3  1.1   Lymph# 0.7 - 4.0 K/uL  1.0  1.4      There is no height or weight on file to calculate BMI.  Orders:  Orders Placed This Encounter  Procedures   MR TIBIA FIBULA LEFT WO CONTRAST   Meds ordered this encounter  Medications   amoxicillin-clavulanate (AUGMENTIN) 500-125 MG tablet    Sig: Take 1 tablet by mouth 3 (three) times daily.    Dispense:  30 tablet  Refill:  0     Procedures: No procedures performed  Clinical Data: No additional findings.  ROS:  All other systems negative, except as noted in the HPI. Review of Systems  Objective: Vital Signs: LMP 06/23/2022   Specialty Comments:  No specialty comments available.  PMFS History: Patient Active Problem List   Diagnosis Date Noted   Essential hypertension 05/07/2020   Anxiety and depression 05/07/2020   Past Medical History:  Diagnosis Date   Asthma    Sinusitis     Family History  Problem Relation Age of Onset   Diabetes Mother    Diabetes Father     Past Surgical History:  Procedure Laterality Date   CARPAL TUNNEL RELEASE Right    TUBAL LIGATION     tubes tied     Social History   Occupational History   Not on file  Tobacco Use   Smoking status: Never   Smokeless tobacco: Never  Vaping Use   Vaping Use: Former  Substance and Sexual Activity   Alcohol use: Yes    Alcohol/week: 1.0  standard drink of alcohol    Types: 1 Glasses of wine per week    Comment: occassionally   Drug use: Yes    Types: Marijuana   Sexual activity: Not on file

## 2022-08-12 NOTE — Addendum Note (Signed)
Addended by: Barnie Del R on: 08/12/2022 04:09 PM   Modules accepted: Orders

## 2022-08-17 ENCOUNTER — Encounter: Payer: Self-pay | Admitting: Family

## 2022-08-19 ENCOUNTER — Encounter: Payer: Self-pay | Admitting: Family

## 2022-08-19 ENCOUNTER — Ambulatory Visit
Admission: RE | Admit: 2022-08-19 | Discharge: 2022-08-19 | Disposition: A | Discharge status: Home / Self Care | Payer: BC Managed Care – PPO | Source: Ambulatory Visit | Attending: Family | Admitting: Family

## 2022-08-19 DIAGNOSIS — W540XXA Bitten by dog, initial encounter: Secondary | ICD-10-CM

## 2022-08-21 ENCOUNTER — Other Ambulatory Visit: Payer: Self-pay

## 2022-08-21 ENCOUNTER — Other Ambulatory Visit: Payer: Self-pay | Admitting: Family

## 2022-08-21 ENCOUNTER — Encounter (HOSPITAL_COMMUNITY): Payer: Self-pay

## 2022-08-21 ENCOUNTER — Inpatient Hospital Stay (HOSPITAL_COMMUNITY)
Admission: EM | Admit: 2022-08-21 | Discharge: 2022-08-23 | DRG: 558 | Disposition: A | Discharge status: Home / Self Care | Payer: BC Managed Care – PPO | Attending: Family Medicine | Admitting: Family Medicine

## 2022-08-21 DIAGNOSIS — R200 Anesthesia of skin: Secondary | ICD-10-CM | POA: Diagnosis present

## 2022-08-21 DIAGNOSIS — W540XXA Bitten by dog, initial encounter: Secondary | ICD-10-CM | POA: Diagnosis not present

## 2022-08-21 DIAGNOSIS — L03116 Cellulitis of left lower limb: Principal | ICD-10-CM

## 2022-08-21 DIAGNOSIS — S81852S Open bite, left lower leg, sequela: Secondary | ICD-10-CM

## 2022-08-21 DIAGNOSIS — W540XXS Bitten by dog, sequela: Secondary | ICD-10-CM

## 2022-08-21 DIAGNOSIS — Z833 Family history of diabetes mellitus: Secondary | ICD-10-CM

## 2022-08-21 DIAGNOSIS — T148XXA Other injury of unspecified body region, initial encounter: Secondary | ICD-10-CM

## 2022-08-21 DIAGNOSIS — D509 Iron deficiency anemia, unspecified: Secondary | ICD-10-CM | POA: Diagnosis present

## 2022-08-21 DIAGNOSIS — Z881 Allergy status to other antibiotic agents status: Secondary | ICD-10-CM

## 2022-08-21 DIAGNOSIS — I890 Lymphedema, not elsewhere classified: Secondary | ICD-10-CM

## 2022-08-21 DIAGNOSIS — G894 Chronic pain syndrome: Secondary | ICD-10-CM | POA: Diagnosis present

## 2022-08-21 DIAGNOSIS — Z79899 Other long term (current) drug therapy: Secondary | ICD-10-CM

## 2022-08-21 DIAGNOSIS — M792 Neuralgia and neuritis, unspecified: Secondary | ICD-10-CM | POA: Diagnosis present

## 2022-08-21 DIAGNOSIS — M60004 Infective myositis, unspecified left leg: Secondary | ICD-10-CM | POA: Diagnosis not present

## 2022-08-21 DIAGNOSIS — J45909 Unspecified asthma, uncomplicated: Secondary | ICD-10-CM | POA: Diagnosis present

## 2022-08-21 DIAGNOSIS — S81852A Open bite, left lower leg, initial encounter: Secondary | ICD-10-CM

## 2022-08-21 DIAGNOSIS — I10 Essential (primary) hypertension: Secondary | ICD-10-CM | POA: Diagnosis present

## 2022-08-21 LAB — CBC
HCT: 33.7 % — ABNORMAL LOW (ref 36.0–46.0)
Hemoglobin: 10.5 g/dL — ABNORMAL LOW (ref 12.0–15.0)
MCH: 23.2 pg — ABNORMAL LOW (ref 26.0–34.0)
MCHC: 31.2 g/dL (ref 30.0–36.0)
MCV: 74.6 fL — ABNORMAL LOW (ref 80.0–100.0)
Platelets: 237 10*3/uL (ref 150–400)
RBC: 4.52 MIL/uL (ref 3.87–5.11)
RDW: 15.7 % — ABNORMAL HIGH (ref 11.5–15.5)
WBC: 2.6 10*3/uL — ABNORMAL LOW (ref 4.0–10.5)
nRBC: 0 % (ref 0.0–0.2)

## 2022-08-21 LAB — BASIC METABOLIC PANEL
Anion gap: 8 (ref 5–15)
BUN: 17 mg/dL (ref 6–20)
CO2: 23 mmol/L (ref 22–32)
Calcium: 8.1 mg/dL — ABNORMAL LOW (ref 8.9–10.3)
Chloride: 105 mmol/L (ref 98–111)
Creatinine, Ser: 0.98 mg/dL (ref 0.44–1.00)
GFR, Estimated: >60 mL/min (ref >60)
Glucose, Bld: 88 mg/dL (ref 70–99)
Potassium: 3.6 mmol/L (ref 3.5–5.1)
Sodium: 136 mmol/L (ref 135–145)

## 2022-08-21 LAB — LACTIC ACID, PLASMA: Lactic Acid, Venous: 0.7 mmol/L (ref 0.5–1.9)

## 2022-08-21 MED ORDER — ONDANSETRON HCL 4 MG PO TABS: 4.0000 mg | ORAL_TABLET | Freq: Four times a day (QID) | ORAL | Status: DC | PRN

## 2022-08-21 MED ORDER — MORPHINE SULFATE (PF) 4 MG/ML IV SOLN
4.0000 mg | Freq: Once | INTRAVENOUS | Status: AC
  Administered 2022-08-21: 4 mg via INTRAVENOUS
  Filled 2022-08-21: qty 1

## 2022-08-21 MED ORDER — ONDANSETRON HCL 4 MG/2ML IJ SOLN: 4.0000 mg | Freq: Four times a day (QID) | INTRAMUSCULAR | Status: DC | PRN

## 2022-08-21 MED ORDER — ACETAMINOPHEN 325 MG PO TABS: 650.0000 mg | ORAL_TABLET | Freq: Four times a day (QID) | ORAL | Status: DC | PRN

## 2022-08-21 MED ORDER — SODIUM CHLORIDE 0.9 % IV SOLN
3.0000 g | Freq: Once | INTRAVENOUS | Status: AC
  Administered 2022-08-21: 3 g via INTRAVENOUS
  Filled 2022-08-21: qty 8

## 2022-08-21 MED ORDER — SODIUM CHLORIDE 0.9 % IV SOLN
1.50 g | Freq: Once | INTRAVENOUS | Status: DC
Start: 2022-08-21 — End: 2022-08-21

## 2022-08-21 MED ORDER — OXYCODONE HCL 5 MG PO TABS
5.0000 mg | ORAL_TABLET | ORAL | Status: DC | PRN
  Administered 2022-08-22 – 2022-08-23 (×5): 5 mg via ORAL
  Filled 2022-08-21 (×6): qty 1

## 2022-08-21 MED ORDER — ACETAMINOPHEN 325 MG PO TABS
650.0000 mg | ORAL_TABLET | Freq: Once | ORAL | Status: AC
  Administered 2022-08-21: 650 mg via ORAL
  Filled 2022-08-21: qty 2

## 2022-08-21 MED ORDER — ACETAMINOPHEN 650 MG RE SUPP: 650.0000 mg | Freq: Four times a day (QID) | RECTAL | Status: DC | PRN

## 2022-08-21 MED ORDER — GABAPENTIN 300 MG PO CAPS
300.0000 mg | ORAL_CAPSULE | Freq: Three times a day (TID) | ORAL | Status: DC
  Administered 2022-08-22 – 2022-08-23 (×5): 300 mg via ORAL
  Filled 2022-08-21 (×6): qty 1

## 2022-08-21 MED ORDER — ENOXAPARIN SODIUM 40 MG/0.4ML IJ SOSY
40.0000 mg | PREFILLED_SYRINGE | INTRAMUSCULAR | Status: DC
  Administered 2022-08-22 – 2022-08-23 (×2): 40 mg via SUBCUTANEOUS
  Filled 2022-08-21 (×2): qty 0.4

## 2022-08-21 MED ORDER — VANCOMYCIN HCL 1500 MG/300ML IV SOLN
1500.0000 mg | Freq: Once | INTRAVENOUS | Status: AC
  Administered 2022-08-21: 1500 mg via INTRAVENOUS
  Filled 2022-08-21: qty 300

## 2022-08-21 NOTE — H&P (Signed)
History and Physical    Amy Guerrero ZOX:[id number] DOB: [birth year] DOA: 08/21/2022  PCP: Amy Fendt, NP   Chief Complaint:  dog    HPI: Amy Guerrero is a 42 y.o. female with medical history significant of asthma who presents the emergency department due to worsening dog bite.  Initial injury occurred on 6/2.  She states that she was assisting her dog out of a cage when he became stuck and bit her leg.  She presented to the ER on June 2.  She was given a 10-day course of Augmentin.  Unfortunately her wounds seem to persist so she presented on 6/9.  She was given doxycycline and Bactrim.  She then saw family medicine on 6/14 for follow-up.  She continued to have pain and complaints so she saw orthopedics on 6/26.  MRI was taken which showed puncture wounds with concern for infectious myositis without evidence of abscess.  Patient was recommended to present to the ER.  On evaluation she did not appear to have any extensive cellulitis however she had open puncture wound.  She was started on vancomycin and Unasyn and due to previous failed course of Augmentin was transitioned to vancomycin and Zosyn.  She was admitted for further workup.  She denies systemic complaints including fever or chills however has persistent pain in her left leg with decreased range of motion.  She states she has been unable to put weight on her leg.   Review of Systems: Review of Systems  All other systems reviewed and are negative.    As per HPI otherwise 10 point review of systems negative.   No Known Allergies  Past Medical History:  Diagnosis Date   Asthma    Sinusitis     Past Surgical History:  Procedure Laterality Date   CARPAL TUNNEL RELEASE Right    TUBAL LIGATION     tubes tied       reports that she has never smoked. She has never used smokeless tobacco. She reports current alcohol use of about 1.0 standard drink of alcohol per week. She reports current drug use. Drug:  Marijuana.  Family History  Problem Relation Age of Onset   Diabetes Mother    Diabetes Father     Prior to Admission medications   Medication Sig Start Date End Date Taking? Authorizing Provider  albuterol (PROVENTIL) (2.5 MG/3ML) 0.083% nebulizer solution Take 3 mLs (2.5 mg total) by nebulization every 6 (six) hours as needed for wheezing or shortness of breath. 07/31/22   Amy Fendt, NP  albuterol (VENTOLIN HFA) 108 (90 Base) MCG/ACT inhaler Inhale 2 puffs into the lungs every 6 (six) hours as needed for wheezing or shortness of breath. 07/31/22   Amy Fendt, NP  amoxicillin-clavulanate (AUGMENTIN) 500-125 MG tablet Take 1 tablet by mouth 3 (three) times daily. 08/12/22   Amy Huguenin, NP  Cetirizine HCl 10 MG CAPS Take 1 capsule (10 mg total) by mouth daily. 12/26/21 03/26/22  Amy Fendt, NP  cyclobenzaprine (FLEXERIL) 5 MG tablet Take 1 tablet (5 mg total) by mouth 2 (two) times daily as needed for muscle spasms. Patient not taking: Reported on 07/31/2022 10/02/21   Amy Bryant, FNP  doxycycline (VIBRA-TABS) 100 MG tablet Take 1 tablet (100 mg total) by mouth 2 (two) times daily. 08/12/22   Amy Huguenin, NP  gabapentin (NEURONTIN) 300 MG capsule Take 1 capsule (300 mg total) by mouth 3 (three) times daily. 07/31/22   Amy Guerrero, Amy  J, NP  HYDROcodone-acetaminophen (NORCO/VICODIN) 5-325 MG tablet Take 1 tablet by mouth every 6 (six) hours as needed (pain). Patient not taking: Reported on 07/31/2022 07/26/22   Amy Resides, MD  hydrocortisone (ANUSOL-HC) 2.5 % rectal cream Place 1 application rectally 2 (two) times daily. 07/17/20   Amy Fendt, NP  meloxicam (MOBIC) 15 MG tablet Take 1 tablet (15 mg total) by mouth daily. Patient not taking: Reported on 07/31/2022 01/22/21   Amy Kelp, MD  mupirocin ointment (BACTROBAN) 2 % Apply 1 Application topically 2 (two) times daily. 08/02/22   Amy Bryant, FNP  ondansetron (ZOFRAN-ODT) 8 MG disintegrating tablet Take 1  tablet (8 mg total) by mouth every 8 (eight) hours as needed for nausea. Patient not taking: Reported on 07/31/2022 02/10/21   Amy Kaplan, MD  Spacer/Aero-Holding Chambers (AEROCHAMBER PLUS) inhaler Use as instructed Patient not taking: Reported on 07/31/2022 05/07/20   Amy Fendt, NP  dicyclomine (BENTYL) 20 MG tablet Take 1 tablet (20 mg total) by mouth 4 (four) times daily -  before meals and at bedtime. Patient not taking: Reported on 07/17/2020 06/18/20 07/22/20  Amy, Hallie C, PA-Guerrero  famotidine (PEPCID) 40 MG tablet Take 1 tablet (40 mg total) by mouth daily. Patient not taking: Reported on 07/17/2020 06/18/20 07/22/20  Amy, Hallie C, PA-Guerrero  lisinopril (ZESTRIL) 5 MG tablet Take 1 tablet (5 mg total) by mouth daily. Patient not taking: Reported on 07/17/2020 05/07/20 07/22/20  Amy Fendt, NP  sertraline (ZOLOFT) 25 MG tablet Take 1 tablet (25 mg total) by mouth at bedtime. Patient not taking: Reported on 07/17/2020 05/07/20 07/22/20  Amy Fendt, NP    Physical Exam: Vitals:   08/21/22 2101  BP: (!) 129/90  Pulse: 82  Resp: 16  Temp: 99.3 F (37.4 Guerrero)  TempSrc: Oral  SpO2: 100%  Weight: 69.4 kg  Height: 5\' 1"  (1.549 m)   Physical Exam Constitutional:      Appearance: She is normal weight.  HENT:     Head: Normocephalic.     Nose: Nose normal.     Mouth/Throat:     Mouth: Mucous membranes are moist.     Pharynx: Oropharynx is clear.  Eyes:     Pupils: Pupils are equal, round, and reactive to light.  Cardiovascular:     Rate and Rhythm: Normal rate and regular rhythm.     Pulses: Normal pulses.     Heart sounds: Normal heart sounds.  Pulmonary:     Effort: Pulmonary effort is normal.     Breath sounds: Normal breath sounds.  Abdominal:     General: Abdomen is flat.  Musculoskeletal:     Cervical back: Normal range of motion.  Skin:    Capillary Refill: Capillary refill takes less than 2 seconds.     Findings: Lesion present.  Neurological:     General: No focal  deficit present.     Mental Status: She is alert.  Psychiatric:        Mood and Affect: Mood normal.        Labs on Admission: I have personally reviewed the patients's labs and imaging studies.  Assessment/Plan Principal Problem:   Dog bite # Dog bite wound complicated by infectious myositis, POA, active - Patient is failedcourse of Augmentin, doxycycline and Bactrim -MRI shows concern for infectious myositis - Presented for IV antibiotics  Plan: Placed on vancomycin and Zosyn PT consult due to weakness and leg from bite wound  # Chronic  anemia-patient's hemoglobin is 10 with previous baseline between 9 and 15.  Will recommend PCP follow-up.  # Chronic pain-continue gabapentin   Admission status: Observation Med-Surg  Certification: The appropriate patient status for this patient is OBSERVATION. Observation status is judged to be reasonable and necessary in order to provide the required intensity of service to ensure the patient's safety. The patient's presenting symptoms, physical exam findings, and initial radiographic and laboratory data in the context of their medical condition is felt to place them at decreased risk for further clinical deterioration. Furthermore, it is anticipated that the patient will be medically stable for discharge from the hospital within 2 midnights of admission.     Alan Mulder MD [facility] If 7PM-7AM, please contact night-coverage [email or web address]  08/21/2022, 11:52 PM

## 2022-08-21 NOTE — ED Provider Notes (Signed)
[place] EMERGENCY DEPARTMENT AT [facility] Provider Note   CSN: [id number] Arrival date & time: 08/21/22  2057     History  Chief Complaint  Patient presents with   Leg Injury    Amy Guerrero is a 42 y.o. female with past medical history significant for hypertension, anxiety, depression, who presents with concern for dog bite on left lower leg on 07/19/2022, she is being followed by orthopedics.  She had MRI on Wednesday and doctor called to let her know that she needed to come to the ED for IV antibiotics and admission.  She has been on Augmentin, doxycycline, and Bactrim already but continues to have pain, swelling, and nonhealing wounds.  She does endorse some mildly elevated temperature.  HPI     Home Medications Prior to Admission medications   Medication Sig Start Date End Date Taking? Authorizing Provider  albuterol (PROVENTIL) (2.5 MG/3ML) 0.083% nebulizer solution Take 3 mLs (2.5 mg total) by nebulization every 6 (six) hours as needed for wheezing or shortness of breath. 07/31/22   Rema Fendt, NP  albuterol (VENTOLIN HFA) 108 (90 Base) MCG/ACT inhaler Inhale 2 puffs into the lungs every 6 (six) hours as needed for wheezing or shortness of breath. 07/31/22   Rema Fendt, NP  amoxicillin-clavulanate (AUGMENTIN) 500-125 MG tablet Take 1 tablet by mouth 3 (three) times daily. 08/12/22   Adonis Huguenin, NP  Cetirizine HCl 10 MG CAPS Take 1 capsule (10 mg total) by mouth daily. 12/26/21 03/26/22  Rema Fendt, NP  cyclobenzaprine (FLEXERIL) 5 MG tablet Take 1 tablet (5 mg total) by mouth 2 (two) times daily as needed for muscle spasms. Patient not taking: Reported on 07/31/2022 10/02/21   Gustavus Bryant, FNP  doxycycline (VIBRA-TABS) 100 MG tablet Take 1 tablet (100 mg total) by mouth 2 (two) times daily. 08/12/22   Adonis Huguenin, NP  gabapentin (NEURONTIN) 300 MG capsule Take 1 capsule (300 mg total) by mouth 3 (three) times daily. 07/31/22   Rema Fendt, NP   HYDROcodone-acetaminophen (NORCO/VICODIN) 5-325 MG tablet Take 1 tablet by mouth every 6 (six) hours as needed (pain). Patient not taking: Reported on 07/31/2022 07/26/22   Zenia Resides, MD  hydrocortisone (ANUSOL-HC) 2.5 % rectal cream Place 1 application rectally 2 (two) times daily. 07/17/20   Rema Fendt, NP  meloxicam (MOBIC) 15 MG tablet Take 1 tablet (15 mg total) by mouth daily. Patient not taking: Reported on 07/31/2022 01/22/21   Lenda Kelp, MD  mupirocin ointment (BACTROBAN) 2 % Apply 1 Application topically 2 (two) times daily. 08/02/22   Gustavus Bryant, FNP  ondansetron (ZOFRAN-ODT) 8 MG disintegrating tablet Take 1 tablet (8 mg total) by mouth every 8 (eight) hours as needed for nausea. Patient not taking: Reported on 07/31/2022 02/10/21   Derwood Kaplan, MD  Spacer/Aero-Holding Chambers (AEROCHAMBER PLUS) inhaler Use as instructed Patient not taking: Reported on 07/31/2022 05/07/20   Rema Fendt, NP  dicyclomine (BENTYL) 20 MG tablet Take 1 tablet (20 mg total) by mouth 4 (four) times daily -  before meals and at bedtime. Patient not taking: Reported on 07/17/2020 06/18/20 07/22/20  Wieters, Hallie C, PA-C  famotidine (PEPCID) 40 MG tablet Take 1 tablet (40 mg total) by mouth daily. Patient not taking: Reported on 07/17/2020 06/18/20 07/22/20  Wieters, Hallie C, PA-C  lisinopril (ZESTRIL) 5 MG tablet Take 1 tablet (5 mg total) by mouth daily. Patient not taking: Reported on 07/17/2020 05/07/20 07/22/20  Zonia Kief, Amy J, NP  sertraline (ZOLOFT) 25 MG tablet Take 1 tablet (25 mg total) by mouth at bedtime. Patient not taking: Reported on 07/17/2020 05/07/20 07/22/20  Rema Fendt, NP      Allergies    Patient has no known allergies.    Review of Systems   Review of Systems  Skin:  Positive for wound.  All other systems reviewed and are negative.   Physical Exam Updated Vital Signs BP (!) 129/90 (BP Location: Right Arm)   Pulse 82   Temp 99.3 F (37.4 C) (Oral)   Resp 16   Ht  5\' 1"  (1.549 m)   Wt 69.4 kg   LMP 07/24/2022 (Exact Date)   SpO2 100%   BMI 28.91 kg/m  Physical Exam Vitals and nursing note reviewed.  Constitutional:      General: She is not in acute distress.    Appearance: Normal appearance.  HENT:     Head: Normocephalic and atraumatic.  Eyes:     General:        Right eye: No discharge.        Left eye: No discharge.  Cardiovascular:     Rate and Rhythm: Normal rate and regular rhythm.     Heart sounds: No murmur heard.    No friction rub. No gallop.  Pulmonary:     Effort: Pulmonary effort is normal.     Breath sounds: Normal breath sounds.  Abdominal:     General: Bowel sounds are normal.     Palpations: Abdomen is soft.  Skin:    General: Skin is warm and dry.     Capillary Refill: Capillary refill takes less than 2 seconds.     Comments: Ongoing leg wounds with poor healing on left lower extremity -- no focal abscess noted, significant soft tissue swelling throughout  Neurological:     Mental Status: She is alert and oriented to person, place, and time.  Psychiatric:        Mood and Affect: Mood normal.        Behavior: Behavior normal.     ED Results / Procedures / Treatments   Labs (all labs ordered are listed, but only abnormal results are displayed) Labs Reviewed  CBC - Abnormal; Notable for the following components:      Result Value   WBC 2.6 (*)    Hemoglobin 10.5 (*)    HCT 33.7 (*)    MCV 74.6 (*)    MCH 23.2 (*)    RDW 15.7 (*)    All other components within normal limits  BASIC METABOLIC PANEL - Abnormal; Notable for the following components:   Calcium 8.1 (*)    All other components within normal limits  CULTURE, BLOOD (ROUTINE X 2)  CULTURE, BLOOD (ROUTINE X 2)  LACTIC ACID, PLASMA  LACTIC ACID, PLASMA    EKG None  Radiology No results found.  Procedures Procedures    Medications Ordered in ED Medications  vancomycin (VANCOREADY) IVPB 1500 mg/300 mL (1,500 mg Intravenous New Bag/Given  08/21/22 2240)  Ampicillin-Sulbactam (UNASYN) 3 g in sodium chloride 0.9 % 100 mL IVPB (0 g Intravenous Stopped 08/21/22 2237)  morphine (PF) 4 MG/ML injection 4 mg (4 mg Intravenous Given 08/21/22 2211)  acetaminophen (TYLENOL) tablet 650 mg (650 mg Oral Given 08/21/22 2210)    ED Course/ Medical Decision Making/ A&P  Medical Decision Making  This patient is a 42 y.o. female who presents to the ED for concern of ongoing left lower leg infection 2/2 dog bite, this involves an extensive number of treatment options, and is a complaint that carries with it a high risk of complications and morbidity. The emergent differential diagnosis prior to evaluation includes, but is not limited to,  cellulitis, abscess, need for IV antibiotics . This is not an exhaustive differential.   Past Medical History / Co-morbidities / Social History: HTN, anxiety, depression  Additional history: Chart reviewed. Pertinent results include: Extensively reviewed recent MRI, as well as multiple evaluation as outpatient with antibiotics and in the hospital after a dog bite.  Patient has been on 3 antibiotics, Bactrim, doxycycline, and Augmentin and continues to have ongoing cellulitis  Physical Exam: Physical exam performed. The pertinent findings include: Ongoing leg wounds with poor healing on left lower extremity -- no focal abscess noted, significant soft tissue swelling throughout   Temperature 99.3 on arrival.  Otherwise vital signs stable.  Mild diastolic hypertension, 129/90.  Lab Tests: I ordered, and personally interpreted labs.  The pertinent results include: BMP with mild hypocalcemia, calcium 8.1.  CBC with mild leukocytopenia, white blood cells 2.6, mild anemia, hemoglobin 10.5, not significantly changed to baseline.  Normal lactic acid, blood cultures pending at this time.   Cardiac Monitoring:  The patient was maintained on a cardiac monitor.  My attending physician Dr. Rush Landmark  viewed and interpreted the cardiac monitored which showed an underlying rhythm of: NSR. I agree with this interpretation.   Medications: I ordered medication including unasyn, vancomycin for antibiotics, morphine for pain, tylenol for mildly temperature.   Consultations Obtained: I requested consultation with the hospitalist, spoke with Dr. Avie Arenas,  and discussed lab and imaging findings as well as pertinent plan - they recommend: admission for IV antibiotics due to ongoing cellulitis with failure of outpatient antibiotics   Disposition: After consideration of the diagnostic results and the patients response to treatment, I feel that patient would benefit from admission at this time   I discussed this case with my attending physician Dr. Rush Landmark who cosigned this note including patient's presenting symptoms, physical exam, and planned diagnostics and interventions. Attending physician stated agreement with plan or made changes to plan which were implemented.    Final Clinical Impression(s) / ED Diagnoses Final diagnoses:  Cellulitis of left lower extremity    Rx / DC Orders ED Discharge Orders     None         [place] 08/21/22 2314    Tegeler, Canary Brim, MD 08/21/22 [phone]

## 2022-08-21 NOTE — ED Notes (Signed)
Original lactic acid result was 0.7. This RN verified PA-C if another lactic was needed (one is scheduled due at 2314). Christian Prosperi PA-C told this RN another lactic does not need collected.

## 2022-08-21 NOTE — ED Triage Notes (Signed)
Pt had dog bite on left lower leg on 07/19/22 and has been followed by orthopedist. She had an MRI on Wed and her doctor called her and told her that she needed to come to ED for IV antibiotics and admission for infection.

## 2022-08-21 NOTE — Progress Notes (Signed)
PHARMACY -  BRIEF ANTIBIOTIC NOTE   Pharmacy has received consult(s) for vancomycin from an ED provider.  The patient's profile has been reviewed for ht/wt/allergies/indication/available labs.    One time order(s) placed for vancomycin 1500 mg + Unasyn 3 g  Further antibiotics/pharmacy consults should be ordered by admitting physician if indicated.                       Thank you,  Pricilla Riffle, PharmD, BCPS Clinical Pharmacist 08/21/2022 9:23 PM

## 2022-08-21 NOTE — Progress Notes (Signed)
Called and spoke with patient. She is 4 weeks out from a dog bite to the left calf.  She has been doing Bactroban dressing changes and has completed 1 course of Augmentin and is currently on second course as well as doxycycline without improvement.  Complains of no improvement in her swelling pain continues to feel she is unable to walk to the due to the calf pain no improvement since last visit on June 26.    Has had MRI L tib fib which was concerning for infectious myositis. No abscess seen. No evidence of osteomyelitis  Recommended presentation to the ED for admission and IV antibiotics.  Failed oral antibiotics.

## 2022-08-22 DIAGNOSIS — W540XXD Bitten by dog, subsequent encounter: Secondary | ICD-10-CM | POA: Diagnosis not present

## 2022-08-22 DIAGNOSIS — D509 Iron deficiency anemia, unspecified: Secondary | ICD-10-CM | POA: Diagnosis present

## 2022-08-22 DIAGNOSIS — R600 Localized edema: Secondary | ICD-10-CM | POA: Diagnosis not present

## 2022-08-22 DIAGNOSIS — W540XXS Bitten by dog, sequela: Secondary | ICD-10-CM | POA: Diagnosis not present

## 2022-08-22 DIAGNOSIS — M7989 Other specified soft tissue disorders: Secondary | ICD-10-CM

## 2022-08-22 DIAGNOSIS — Z79899 Other long term (current) drug therapy: Secondary | ICD-10-CM | POA: Diagnosis not present

## 2022-08-22 DIAGNOSIS — I10 Essential (primary) hypertension: Secondary | ICD-10-CM | POA: Diagnosis present

## 2022-08-22 DIAGNOSIS — M792 Neuralgia and neuritis, unspecified: Secondary | ICD-10-CM | POA: Diagnosis present

## 2022-08-22 DIAGNOSIS — S81852S Open bite, left lower leg, sequela: Secondary | ICD-10-CM | POA: Diagnosis not present

## 2022-08-22 DIAGNOSIS — G894 Chronic pain syndrome: Secondary | ICD-10-CM | POA: Diagnosis present

## 2022-08-22 DIAGNOSIS — L03116 Cellulitis of left lower limb: Secondary | ICD-10-CM | POA: Diagnosis present

## 2022-08-22 DIAGNOSIS — R200 Anesthesia of skin: Secondary | ICD-10-CM | POA: Diagnosis present

## 2022-08-22 DIAGNOSIS — T148XXA Other injury of unspecified body region, initial encounter: Secondary | ICD-10-CM

## 2022-08-22 DIAGNOSIS — Z881 Allergy status to other antibiotic agents status: Secondary | ICD-10-CM | POA: Diagnosis not present

## 2022-08-22 DIAGNOSIS — W540XXA Bitten by dog, initial encounter: Secondary | ICD-10-CM | POA: Diagnosis not present

## 2022-08-22 DIAGNOSIS — I890 Lymphedema, not elsewhere classified: Secondary | ICD-10-CM

## 2022-08-22 DIAGNOSIS — Z833 Family history of diabetes mellitus: Secondary | ICD-10-CM | POA: Diagnosis not present

## 2022-08-22 DIAGNOSIS — M60004 Infective myositis, unspecified left leg: Secondary | ICD-10-CM | POA: Diagnosis present

## 2022-08-22 DIAGNOSIS — J45909 Unspecified asthma, uncomplicated: Secondary | ICD-10-CM | POA: Diagnosis present

## 2022-08-22 LAB — VITAMIN B12: Vitamin B-12: 242 pg/mL (ref 180–914)

## 2022-08-22 LAB — CREATININE, SERUM
Creatinine, Ser: 1.02 mg/dL — ABNORMAL HIGH (ref 0.44–1.00)
GFR, Estimated: >60 mL/min (ref >60)

## 2022-08-22 LAB — CBC
HCT: 32.1 % — ABNORMAL LOW (ref 36.0–46.0)
Hemoglobin: 9.8 g/dL — ABNORMAL LOW (ref 12.0–15.0)
MCH: 22.8 pg — ABNORMAL LOW (ref 26.0–34.0)
MCHC: 30.5 g/dL (ref 30.0–36.0)
MCV: 74.8 fL — ABNORMAL LOW (ref 80.0–100.0)
Platelets: 217 10*3/uL (ref 150–400)
RBC: 4.29 MIL/uL (ref 3.87–5.11)
RDW: 15.7 % — ABNORMAL HIGH (ref 11.5–15.5)
WBC: 3.2 10*3/uL — ABNORMAL LOW (ref 4.0–10.5)
nRBC: 0 % (ref 0.0–0.2)

## 2022-08-22 LAB — IRON AND TIBC
Iron: 73 ug/dL (ref 28–170)
Saturation Ratios: 18 % (ref 10.4–31.8)
TIBC: 405 ug/dL (ref 250–450)
UIBC: 332 ug/dL

## 2022-08-22 LAB — RETICULOCYTES
Immature Retic Fract: 11.9 % (ref 2.3–15.9)
RBC.: 4.44 MIL/uL (ref 3.87–5.11)
Retic Count, Absolute: 54.2 10*3/uL (ref 19.0–186.0)
Retic Ct Pct: 1.2 % (ref 0.4–3.1)

## 2022-08-22 LAB — FOLATE: Folate: 12.6 ng/mL (ref >5.9)

## 2022-08-22 LAB — FERRITIN: Ferritin: 10 ng/mL — ABNORMAL LOW (ref 11–307)

## 2022-08-22 MED ORDER — PIPERACILLIN-TAZOBACTAM 3.375 G IVPB
3.3750 g | Freq: Three times a day (TID) | INTRAVENOUS | Status: DC
  Administered 2022-08-22 – 2022-08-23 (×4): 3.375 g via INTRAVENOUS
  Filled 2022-08-22 (×4): qty 50

## 2022-08-22 MED ORDER — DIPHENHYDRAMINE HCL 50 MG/ML IJ SOLN
50.0000 mg | Freq: Once | INTRAMUSCULAR | Status: AC
  Administered 2022-08-22: 50 mg via INTRAVENOUS
  Filled 2022-08-22: qty 1

## 2022-08-22 NOTE — Plan of Care (Signed)
  Problem: Education: Goal: Knowledge of General Education information will improve Description: Including pain rating scale, medication(s)/side effects and non-pharmacologic comfort measures Outcome: Progressing   Problem: Activity: Goal: Risk for activity intolerance will decrease Outcome: Progressing   Problem: Pain Managment: Goal: General experience of comfort will improve Outcome: Progressing   

## 2022-08-22 NOTE — Progress Notes (Addendum)
[facility]  PROGRESS NOTE  Amy FRANZONE JXB:[id number] DOB: Sep 30, 1980 DOA: 08/21/2022 PCP: Rema Fendt, NP   Brief HPI:   42 y.o. female with medical history significant of asthma who presents the emergency department due to worsening dog bite.  Initial injury occurred on 6/2.  She states that she was assisting her dog out of a cage when he became stuck and bit her leg.  She presented to the ER on June 2.  She was given a 10-day course of Augmentin.  Unfortunately her wounds seem to persist so she presented on 6/9.  She was given doxycycline and Bactrim.  She then saw family medicine on 6/14 for follow-up.  She continued to have pain and complaints so she saw orthopedics on 6/26.  MRI was taken which showed puncture wounds with concern for infectious myositis without evidence of abscess.  Patient was recommended to present to the ER. Patient started on vancomycin and Zosyn due to failed outpatient treatment.    Assessment/Plan:   Left lower extremity swelling -Dog bite on 6/2; has been on antibiotics for past 1 month -Does not appear to be septic; no erythema or tenderness in left lower extremity, healed lesions -Swelling likely developing lymphedema -ID consult for further recommendations for antibiotics, may not need antibiotics -MRI of left lower extremity on 7/3 showed muscle edema; no drainable abscess, no osteomyelitis  Microcytic anemia -Hemoglobin is 9.8 -Check anemia panel  Chronic pain syndrome -Continue gabapentin   Medications     enoxaparin (LOVENOX) injection  40 mg Subcutaneous Q24H   gabapentin  300 mg Oral TID     Data Reviewed:   CBG:  No results for input(s): "GLUCAP" in the last 168 hours.  SpO2: 99 %    Vitals:   08/22/22 0011 08/22/22 0100 08/22/22 0144 08/22/22 0540  BP: 123/84 118/77 126/78 121/78  Pulse: (!) 59 64 61 66  Resp: 18 17 17 18   Temp: 98 F (36.7 C)  99.1 F (37.3 C) 98.1 F (36.7 C)  TempSrc: Oral  Oral   SpO2:  100% 100% 100% 99%  Weight:      Height:          Data Reviewed:  Basic Metabolic Panel: Recent Labs  Lab 08/21/22 2153 08/21/22 2350  NA 136  --   K 3.6  --   CL 105  --   CO2 23  --   GLUCOSE 88  --   BUN 17  --   CREATININE 0.98 1.02*  CALCIUM 8.1*  --     CBC: Recent Labs  Lab 08/21/22 2153 08/21/22 2350  WBC 2.6* 3.2*  HGB 10.5* 9.8*  HCT 33.7* 32.1*  MCV 74.6* 74.8*  PLT 237 217    LFT No results for input(s): "AST", "ALT", "ALKPHOS", "BILITOT", "PROT", "ALBUMIN" in the last 168 hours.   Antibiotics: Anti-infectives (From admission, onward)    Start     Dose/Rate Route Frequency Ordered Stop   08/22/22 0600  piperacillin-tazobactam (ZOSYN) IVPB 3.375 g        3.375 g 12.5 mL/hr over 240 Minutes Intravenous Every 8 hours 08/22/22 0113     08/21/22 2130  vancomycin (VANCOREADY) IVPB 1500 mg/300 mL        1,500 mg 150 mL/hr over 120 Minutes Intravenous  Once 08/21/22 2117 08/22/22 0040   08/21/22 2130  Ampicillin-Sulbactam (UNASYN) 3 g in sodium chloride 0.9 % 100 mL IVPB        3 g 200 mL/hr  over 30 Minutes Intravenous  Once 08/21/22 2123 08/21/22 2237   08/21/22 2115  ampicillin-sulbactam (UNASYN) 1.5 g in sodium chloride 0.9 % 100 mL IVPB  Status:  Discontinued        1.5 g 200 mL/hr over 30 Minutes Intravenous  Once 08/21/22 2114 08/21/22 2123        DVT prophylaxis: Lovenox  Code Status: Full code  Family Communication: Discussed with family ember at bedside   CONSULTS    Subjective   Denies pain, states that she has developed leg swelling.  She has not moved her leg for past 1 month.  Not bearing weight on it.   Objective    Physical Examination:   General: Appears in no acute distress Cardiovascular: S1-S2, regular Respiratory: Lungs clear to auscultation bilaterally Abdomen: Soft, nontender, no organomegaly Extremities: Left lower extremity is swollen, no erythema noted, no tenderness noted.  Healed puncture wounds noted  on the skin. Neurologic: Alert, oriented x 3, no focal deficit noted   Status is: Inpatient:             Meredeth Ide   [facility] If 7PM-7AM, please contact night-coverage at [email or web address], Office  [phone]   08/22/2022, 10:04 AM  LOS: 0 days

## 2022-08-22 NOTE — ED Notes (Signed)
Patient called out complaining of itching and redness on abdomen right side, going around to her back. Patient had vancomycin running at this time and was paused. Contacted the doctor to make them aware of the reaction possibly. Doctor ordered benadryl and that was given. At this time the vancomycin is still paused and patient was sent upstairs.

## 2022-08-22 NOTE — Consult Note (Addendum)
Date of Admission:  08/21/2022          Reason for Consult: hx of dog bites and ? myositis    Referring Provider: Mauro Kaufmann, MD   Assessment:  Hx of multiple dog bites that appear to have been appropriately treated with antibiotics in particular the initial Augmentin course Residual edema in her lower extremities from these multiple bites Nerve damage to her left foot now from the bites as well   Plan:  DC antibiotics Agree with physical therapy   I will sign off please call with further questions.  Principal Problem:   Dog bite Active Problems:   Lymphedema   Nerve damage   Scheduled Meds:  enoxaparin (LOVENOX) injection  40 mg Subcutaneous Q24H   gabapentin  300 mg Oral TID   Continuous Infusions:  piperacillin-tazobactam 3.375 g (08/22/22 1518)   PRN Meds:.acetaminophen **OR** acetaminophen, ondansetron **OR** ondansetron (ZOFRAN) IV, oxyCODONE  HPI: Amy Guerrero is a 42 y.o. female with a past medical history significant for asthma and some nerve damage to her foot from a different injury who presented to the ER on June 2 after sustaining multiple dog bites.  She was given a appropriate 10-day course of Augmentin.  Wounds persisted but sounds to have been improved and she was then given doxycycline followed by Bactrim.  She was seen by her family medicine physician and also orthopedics on June 26.  In talking to her it does not seem like there is much in the way of drainage from the wounds but certainly not much in the way of purulence.  She does not have systemic symptoms of fevers or chills or nausea.  An MRI was ordered which was read by radiology as possibly showing cellulitis and myositis with edema in the soleus muscle flexor houses longus muscle flexor digitorum longus.  There is no abscess or evidence of osteomyelitis.  She was admitted and newly given Unasyn and vancomycin and then also Zosyn.  Clinically she does not have any evidence  of active infection.  I suspect the edema in her legs which has been bothering her quite a bit is still left from the multiple dog bites that occurred.  She also complains of numbness in her foot which she believes is due to nerve damage.  I would discontinue her antibiotics.  She can certainly come and see [place] if there is evidence of clinical recurrence of infection.  I think in this situation the MRI was treated without putting it into context with the patient's clinical presentation.  I have personally spent 80 minutes involved in face-to-face and non-face-to-face activities for this patient on the day of the visit. Professional time spent includes the following activities: Preparing to see the patient (review of tests), Obtaining and/or reviewing separately obtained history (admission/discharge record), Performing a medically appropriate examination and/or evaluation , Ordering medications/tests/procedures, referring and communicating with other health care professionals, Documenting clinical information in the EMR, Independently interpreting results (not separately reported), Communicating results to the patient/family/caregiver, Counseling and educating the patient/family/caregiver and Care coordination (not separately reported).    Left leg         Review of Systems: Review of Systems  Constitutional:  Negative for chills, fever, malaise/fatigue and weight loss.  HENT:  Negative for congestion and sore throat.   Eyes:  Negative for blurred vision and photophobia.  Respiratory:  Negative for cough, shortness of breath and wheezing.   Cardiovascular:  Positive for leg  swelling. Negative for chest pain and palpitations.  Gastrointestinal:  Negative for abdominal pain, blood in stool, constipation, diarrhea, heartburn, melena, nausea and vomiting.  Genitourinary:  Negative for dysuria, flank pain and hematuria.  Musculoskeletal:  Negative for back pain, falls, joint pain and myalgias.   Skin:  Negative for itching and rash.  Neurological:  Negative for dizziness, focal weakness, loss of consciousness, weakness and headaches.  Endo/Heme/Allergies:  Does not bruise/bleed easily.  Psychiatric/Behavioral:  Negative for depression and suicidal ideas. The patient does not have insomnia.     Past Medical History:  Diagnosis Date   Asthma    Sinusitis     Social History   Tobacco Use   Smoking status: Never   Smokeless tobacco: Never  Vaping Use   Vaping Use: Former  Substance Use Topics   Alcohol use: Yes    Alcohol/week: 1.0 standard drink of alcohol    Types: 1 Glasses of wine per week    Comment: occassionally   Drug use: Yes    Types: Marijuana    Family History  Problem Relation Age of Onset   Diabetes Mother    Diabetes Father    Allergies  Allergen Reactions   Vancomycin Itching and Rash    OBJECTIVE: Blood pressure (!) 131/98, pulse 78, temperature 98.5 F (36.9 C), temperature source Oral, resp. rate 18, height 5\' 1"  (1.549 m), weight 69.4 kg, last menstrual period 07/24/2022, SpO2 100 %.  Physical Exam Constitutional:      General: She is not in acute distress.    Appearance: Normal appearance. She is well-developed. She is not ill-appearing or diaphoretic.  HENT:     Head: Normocephalic and atraumatic.     Right Ear: Hearing and external ear normal.     Left Ear: Hearing and external ear normal.     Nose: No nasal deformity or rhinorrhea.  Eyes:     General: No scleral icterus.    Conjunctiva/sclera: Conjunctivae normal.     Right eye: Right conjunctiva is not injected.     Left eye: Left conjunctiva is not injected.     Pupils: Pupils are equal, round, and reactive to light.  Neck:     Vascular: No JVD.  Cardiovascular:     Rate and Rhythm: Normal rate and regular rhythm.     Heart sounds: S1 normal and S2 normal.  Pulmonary:     Effort: Pulmonary effort is normal. No respiratory distress.     Breath sounds: No wheezing.   Abdominal:     General: Bowel sounds are normal. There is no distension.     Palpations: Abdomen is soft.     Tenderness: There is no abdominal tenderness.  Musculoskeletal:        General: Normal range of motion.     Right shoulder: Normal.     Left shoulder: Normal.     Cervical back: Normal range of motion and neck supple.     Right hip: Normal.     Left hip: Normal.     Right knee: Normal.     Left knee: Normal.  Lymphadenopathy:     Head:     Right side of head: No submandibular, preauricular or posterior auricular adenopathy.     Left side of head: No submandibular, preauricular or posterior auricular adenopathy.     Cervical: No cervical adenopathy.     Right cervical: No superficial or deep cervical adenopathy.    Left cervical: No superficial or deep cervical adenopathy.  Skin:    General: Skin is warm and dry.     Coloration: Skin is not pale.     Findings: No abrasion, bruising, ecchymosis, erythema, lesion or rash.     Nails: There is no clubbing.  Neurological:     General: No focal deficit present.     Mental Status: She is alert and oriented to person, place, and time.     Sensory: No sensory deficit.     Coordination: Coordination normal.     Gait: Gait normal.  Psychiatric:        Attention and Perception: She is attentive.        Mood and Affect: Mood normal.        Speech: Speech normal.        Behavior: Behavior normal. Behavior is cooperative.        Thought Content: Thought content normal.        Judgment: Judgment normal.     Lab Results Lab Results  Component Value Date   WBC 3.2 (L) 08/21/2022   HGB 9.8 (L) 08/21/2022   HCT 32.1 (L) 08/21/2022   MCV 74.8 (L) 08/21/2022   PLT 217 08/21/2022    Lab Results  Component Value Date   CREATININE 1.02 (H) 08/21/2022   BUN 17 08/21/2022   NA 136 08/21/2022   K 3.6 08/21/2022   CL 105 08/21/2022   CO2 23 08/21/2022    Lab Results  Component Value Date   ALT 18 07/17/2020   AST 19  07/17/2020   ALKPHOS 77 07/17/2020   BILITOT 0.6 07/17/2020     Microbiology: Recent Results (from the past 240 hour(s))  Blood culture (routine x 2)     Status: None (Preliminary result)   Collection Time: 08/21/22  9:51 PM   Specimen: BLOOD LEFT ARM  Result Value Ref Range Status   Specimen Description   Final    BLOOD LEFT ARM Performed at [facility], [address] [place], [place] [address]    Special Requests   Final    Blood Culture adequate volume BOTTLES DRAWN AEROBIC AND ANAEROBIC Performed at [facility], [address], [place], [place] [address]    Culture   Final    NO GROWTH < 12 HOURS Performed at [facility], [address] [place], [place] [address]    Report Status PENDING  Incomplete  Blood culture (routine x 2)     Status: None (Preliminary result)   Collection Time: 08/21/22  9:52 PM   Specimen: BLOOD RIGHT ARM  Result Value Ref Range Status   Specimen Description   Final    BLOOD RIGHT ARM Performed at [facility], [address], [place], [place] [address]    Special Requests   Final    Blood Culture adequate volume BOTTLES DRAWN AEROBIC AND ANAEROBIC Performed at [facility], [address], [place], [place] [address]    Culture   Final    NO GROWTH < 12 HOURS Performed at [facility], [address], [place], [place] [address]    Report Status PENDING  Incomplete    Acey Lav, MD [facility]ious [facility] [phone] pager  08/22/2022, 5:51 PM

## 2022-08-22 NOTE — TOC Initial Note (Signed)
Transition of Care [facility]) - Initial/Assessment Note    Patient Details  Name: Amy Guerrero MRN: [id number] Date of Birth: 1980/12/21  Transition of Care [facility]) CM/SW Contact:    Carmina Miller, LCSWA Phone Number: 08/22/2022, 1:53 PM  Clinical Narrative:                  CSW notified by RN that there is an order for RW for this pt. CSW spoke with Jasmine at [place], youth RW ordered for pt, will be delivered to pt's room.        Patient Goals and CMS Choice            Expected Discharge Plan and Services                                              Prior Living Arrangements/Services                       Activities of Daily Living Home Assistive Devices/Equipment: Crutches ADL Screening (condition at time of admission) Patient's cognitive ability adequate to safely complete daily activities?: Yes Is the patient deaf or have difficulty hearing?: No Does the patient have difficulty seeing, even when wearing glasses/contacts?: No Does the patient have difficulty concentrating, remembering, or making decisions?: No Patient able to express need for assistance with ADLs?: Yes Does the patient have difficulty dressing or bathing?: No Independently performs ADLs?: Yes (appropriate for developmental age) Does the patient have difficulty walking or climbing stairs?: Yes Weakness of Legs: Left Weakness of Arms/Hands: None  Permission Sought/Granted                  Emotional Assessment              Admission diagnosis:  Cellulitis of left lower extremity [L03.116] Dog bite [W09.0XXA] Patient Active Problem List   Diagnosis Date Noted   Dog bite 08/21/2022   Essential hypertension 05/07/2020   Anxiety and depression 05/07/2020   PCP:  Rema Fendt, NP Pharmacy:   [id number] - [place], [place] - [address] [place] [address] Phone: [phone] Fax: [phone]     Social Determinants of  Health (SDOH) Social History: SDOH Screenings   Food Insecurity: No Food Insecurity (08/22/2022)  Housing: Low Risk  (08/22/2022)  Transportation Needs: No Transportation Needs (08/22/2022)  Utilities: Not At Risk (08/22/2022)  Alcohol Screen: Low Risk  (08/10/2022)  Depression (PHQ2-9): Low Risk  (07/31/2022)  Financial Resource Strain: Medium Risk (08/10/2022)  Physical Activity: Unknown (08/10/2022)  Social Connections: Socially Isolated (08/10/2022)  Stress: Stress Concern Present (08/10/2022)  Tobacco Use: Low Risk  (08/21/2022)   SDOH Interventions:     Readmission Risk Interventions     No data to display

## 2022-08-22 NOTE — Evaluation (Signed)
Physical Therapy Evaluation Patient Details Name: Amy Guerrero MRN: [id number] DOB: 01-05-1981 Today's Date: 08/22/2022  History of Present Illness  Pt admitted 2* L LE infected dog bite  Clinical Impression  Pt admitted as above and presenting with functional mobility limitations 2* decreased L ankle strength/ROM and inability to WB on L LE affecting balance.  Pt reports functioning for last month on crutches (which she felt unsafe on 2* prior falls) or rollator (sitting on seat and pushing around backward in home).  This date, pt crutches adjusted to better fit pt's stature and pt up to ambulate 150' with min guard for stability.  Pt introduced to RW and demonstrates marked improvement in stability and remarking "I feel so much steadier and safer with this".  Pt reports difficulty negotiating stairs at home and reports very dangerous technique placing pt at high risk for falls - pt instructed and performed appropriate technique and written instruction provided.  Pt demonstrating min AROM L ankle but achieved near neutral dorsiflex with PROM.  Pt educated on AAROM dorsiflex assisting with belt and up to stand at beside with RW and erect posture working to bring full foot into contact with floor and increasing WB tolerance.  Dependent on acute stay progress, pt could benefit from follow up OP PT to regain strength/ROM in L LE to return to PLOF.      Assistance Recommended at Discharge PRN  If plan is discharge home, recommend the following:  Can travel by private vehicle  A little help with walking and/or transfers;Assist for transportation;Help with stairs or ramp for entrance;Assistance with cooking/housework        Equipment Recommendations Rolling walker (2 wheels) (youth level)  Recommendations for Other Services  OT consult    Functional Status Assessment Patient has had a recent decline in their functional status and demonstrates the ability to make significant improvements in  function in a reasonable and predictable amount of time.     Precautions / Restrictions Precautions Precautions: Fall Restrictions Weight Bearing Restrictions: No      Mobility  Bed Mobility Overal bed mobility: Modified Independent                  Transfers Overall transfer level: Needs assistance Equipment used: Rolling walker (2 wheels), Crutches Transfers: Sit to/from Stand Sit to Stand: Supervision           General transfer comment: min cues for LE management and use of UEs to self assist and improve stability in transfers    Ambulation/Gait Ambulation/Gait assistance: Min assist, Min guard, Supervision Gait Distance (Feet): 160 Feet (twice) Assistive device: Rolling walker (2 wheels), Crutches Gait Pattern/deviations: Step-to pattern, Shuffle, Trunk flexed       General Gait Details: Pt ambulated 160' with crutches, min assist for technique and min guard for intermittent instability - no overt LOB;  Additional 160' with RW and cues for posture and position from RW  Stairs Stairs: Yes Stairs assistance: Min assist Stair Management: No rails, One rail Right, Step to pattern, Forwards, With crutches Number of Stairs: 11 General stair comments: 7 stairs with bil crutches and 4 stairs with crutch/rail  Wheelchair Mobility     Tilt Bed    Modified Rankin (Stroke Patients Only)       Balance Overall balance assessment: Mild deficits observed, not formally tested  Pertinent Vitals/Pain Pain Assessment Pain Assessment: 0-10 Pain Score: 1     Home Living Family/patient expects to be discharged to:: Private residence Living Arrangements: Alone   Type of Home: House Home Access: Stairs to enter Entrance Stairs-Rails: Right (pt states she feels is not reliable to lean on) Entrance Stairs-Number of Steps: 4   Home Layout: One level Home Equipment: Crutches;Rollator (4 wheels)       Prior Function Prior Level of Function : Independent/Modified Independent             Mobility Comments: Pt has been ambulating on crutches but feels unsafe 2* falls or wheeling bkwd seated in rollator       Hand Dominance        Extremity/Trunk Assessment   Upper Extremity Assessment Upper Extremity Assessment: Overall WFL for tasks assessed    Lower Extremity Assessment Lower Extremity Assessment: LLE deficits/detail LLE Deficits / Details: trace AROM at ankle but PROM to neutral DF; Pt states "I can't put wt on this leg it will buckle"  quads WFL    Cervical / Trunk Assessment Cervical / Trunk Assessment: Normal  Communication   Communication: No difficulties  Cognition Arousal/Alertness: Awake/alert Behavior During Therapy: WFL for tasks assessed/performed Overall Cognitive Status: Within Functional Limits for tasks assessed                                          General Comments      Exercises General Exercises - Lower Extremity Ankle Circles/Pumps: AAROM, 10 reps, Supine, Left   Assessment/Plan    PT Assessment Patient needs continued PT services  PT Problem List Decreased range of motion;Decreased strength;Decreased activity tolerance;Decreased balance;Decreased mobility       PT Treatment Interventions DME instruction;Gait training;Stair training;Functional mobility training;Therapeutic activities;Therapeutic exercise;Patient/family education    PT Goals (Current goals can be found in the Care Plan section)  Acute Rehab PT Goals Patient Stated Goal: REgain IND PT Goal Formulation: With patient Time For Goal Achievement: 09/05/22 Potential to Achieve Goals: Good    Frequency Min 1X/week     Co-evaluation               AM-PAC PT "6 Clicks" Mobility  Outcome Measure Help needed turning from your back to your side while in a flat bed without using bedrails?: None Help needed moving from lying on your back to sitting on  the side of a flat bed without using bedrails?: None Help needed moving to and from a bed to a chair (including a wheelchair)?: A Little Help needed standing up from a chair using your arms (e.g., wheelchair or bedside chair)?: A Little Help needed to walk in hospital room?: A Little Help needed climbing 3-5 steps with a railing? : A Little 6 Click Score: 20    End of Session Equipment Utilized During Treatment: Gait belt Activity Tolerance: Patient tolerated treatment well Patient left: in bed;with call bell/phone within reach;with family/visitor present Nurse Communication: Mobility status PT Visit Diagnosis: Unsteadiness on feet (R26.81);Difficulty in walking, not elsewhere classified (R26.2)    Time: 1610-9604 PT Time Calculation (min) (ACUTE ONLY): 30 min   Charges:   PT Evaluation $PT Eval Low Complexity: 1 Low PT Treatments $Gait Training: 8-22 mins PT General Charges $$ ACUTE PT VISIT: 1 Visit         Mauro Kaufmann PT Acute Rehabilitation Services Pager [phone] Office [phone]  Willy Vorce 08/22/2022, 3:12 PM

## 2022-08-22 NOTE — ED Notes (Signed)
ED TO INPATIENT HANDOFF REPORT  ED Nurse Name and Phone #: Claiborne Rigg  Name/Age/Gender Amy Guerrero 42 y.o. female Room/Bed: WA13/WA13  Code Status   Code Status: Full Code  Home/SNF/Other Home Patient oriented to: self, place, time, and situation Is this baseline? Yes   Triage Complete: Triage complete  Chief Complaint Dog bite [Z61.0XXA]  Triage Note Pt had dog bite on left lower leg on 07/19/22 and has been followed by orthopedist. She had an MRI on Wed and her doctor called her and told her that she needed to come to ED for IV antibiotics and admission for infection.    Allergies No Known Allergies  Level of Care/Admitting Diagnosis ED Disposition     ED Disposition  Admit   Condition  --   Comment  Hospital Area: [facility] [[id number]]  Level of Care: Med-Surg [16]  May place patient in observation at [facility] or [facility] if equivalent level of care is available:: Yes  Covid Evaluation: Asymptomatic - no recent exposure (last 10 days) testing not required  Diagnosis: Dog bite [[id number]]  Admitting Physician: Alan Mulder [[id number]]  Attending Physician: Alan Mulder [[id number]]          B Medical/Surgery History Past Medical History:  Diagnosis Date   Asthma    Sinusitis    Past Surgical History:  Procedure Laterality Date   CARPAL TUNNEL RELEASE Right    TUBAL LIGATION     tubes tied       A IV Location/Drains/Wounds Patient Lines/Drains/Airways Status     Active Line/Drains/Airways     Name Placement date Placement time Site Days   Peripheral IV 08/21/22 20 G Right Antecubital 08/21/22  2153  Antecubital  1            Intake/Output Last 24 hours  Intake/Output Summary (Last 24 hours) at 08/22/2022 0055 Last data filed at 08/21/2022 2237 Gross per 24 hour  Intake 100 ml  Output --  Net 100 ml    Labs/Imaging Results for orders placed or performed during the hospital encounter of 08/21/22  (from the past 48 hour(s))  CBC     Status: Abnormal   Collection Time: 08/21/22  9:53 PM  Result Value Ref Range   WBC 2.6 (L) 4.0 - 10.5 K/uL   RBC 4.52 3.87 - 5.11 MIL/uL   Hemoglobin 10.5 (L) 12.0 - 15.0 g/dL   HCT 21.3 (L) 08.6 - 57.8 %   MCV 74.6 (L) 80.0 - 100.0 fL   MCH 23.2 (L) 26.0 - 34.0 pg   MCHC 31.2 30.0 - 36.0 g/dL   RDW 46.9 (H) 62.9 - 52.8 %   Platelets 237 150 - 400 K/uL   nRBC 0.0 0.0 - 0.2 %    Comment: Performed at [facility], [address] [place], [place] [address]  Basic metabolic panel     Status: Abnormal   Collection Time: 08/21/22  9:53 PM  Result Value Ref Range   Sodium 136 135 - 145 mmol/L   Potassium 3.6 3.5 - 5.1 mmol/L   Chloride 105 98 - 111 mmol/L   CO2 23 22 - 32 mmol/L   Glucose, Bld 88 70 - 99 mg/dL    Comment: Glucose reference range applies only to samples taken after fasting for at least 8 hours.   BUN 17 6 - 20 mg/dL   Creatinine, Ser 4.01 0.44 - 1.00 mg/dL   Calcium 8.1 (L) 8.9 - 10.3  mg/dL   GFR, Estimated >54 >09 mL/min    Comment: (NOTE) Calculated using the CKD-EPI Creatinine Equation (2021)    Anion gap 8 5 - 15    Comment: Performed at [facility], [address] [place], [place] [address]  Lactic acid, plasma     Status: None   Collection Time: 08/21/22  9:53 PM  Result Value Ref Range   Lactic Acid, Venous 0.7 0.5 - 1.9 mmol/L    Comment: Performed at [facility], [address] [place], [place] [address]  CBC     Status: Abnormal   Collection Time: 08/21/22 11:50 PM  Result Value Ref Range   WBC 3.2 (L) 4.0 - 10.5 K/uL   RBC 4.29 3.87 - 5.11 MIL/uL   Hemoglobin 9.8 (L) 12.0 - 15.0 g/dL   HCT 56.2 (L) 13.0 - 86.5 %   MCV 74.8 (L) 80.0 - 100.0 fL   MCH 22.8 (L) 26.0 - 34.0 pg   MCHC 30.5 30.0 - 36.0 g/dL   RDW 78.4 (H) 69.6 - 29.5 %   Platelets 217 150 - 400 K/uL   nRBC 0.0 0.0 - 0.2 %    Comment: Performed at [facility], [address] [place], [place] [address]  Creatinine, serum     Status: Abnormal   Collection Time: 08/21/22 11:50 PM  Result Value Ref Range   Creatinine, Ser 1.02 (H) 0.44 - 1.00 mg/dL   GFR, Estimated >24 >40 mL/min    Comment: (NOTE) Calculated using the CKD-EPI Creatinine Equation (2021) Performed at [facility], [address], [place], [place] [address]    No results found.  Pending Labs Unresulted Labs (From admission, onward)     Start     Ordered   08/28/22 0500  Creatinine, serum  (enoxaparin (LOVENOX)    CrCl >/= 30 ml/min)  Weekly,   R     Comments: while on enoxaparin therapy    08/21/22 2351   08/22/22 0025  HIV Antibody (routine testing w rflx)  Once,   R        08/22/22 0025   08/21/22 2114  Blood culture (routine x 2)  BLOOD CULTURE X 2,   R (with STAT occurrences)      08/21/22 2114            Vitals/Pain Today's Vitals   08/21/22 2101 08/21/22 2111 08/21/22 2240 08/22/22 0011  BP: (!) 129/90   123/84  Pulse: 82   (!) 59  Resp: 16   18  Temp: 99.3 F (37.4 C)   98 F (36.7 C)  TempSrc: Oral   Oral  SpO2: 100%   100%  Weight: 153 lb (69.4 kg)     Height: 5\' 1"  (1.549 m)     PainSc:  7  0-No pain     Isolation Precautions No active isolations  Medications Medications  gabapentin (NEURONTIN) capsule 300 mg (300 mg Oral Given 08/22/22 0009)  enoxaparin (LOVENOX) injection 40 mg (has no administration in time range)  acetaminophen (TYLENOL) tablet 650 mg (has no administration in time range)    Or  acetaminophen (TYLENOL) suppository 650 mg (has no administration in time range)  oxyCODONE (Oxy IR/ROXICODONE) immediate release tablet 5 mg (has no administration in time range)  ondansetron (ZOFRAN) tablet 4 mg (has no administration in time range)    Or  ondansetron (ZOFRAN) injection 4 mg (has no administration in time range)  vancomycin (VANCOREADY) IVPB  1500 mg/300 mL (1,500 mg Intravenous New Bag/Given 08/21/22 2240)   Ampicillin-Sulbactam (UNASYN) 3 g in sodium chloride 0.9 % 100 mL IVPB (0 g Intravenous Stopped 08/21/22 2237)  morphine (PF) 4 MG/ML injection 4 mg (4 mg Intravenous Given 08/21/22 2211)  acetaminophen (TYLENOL) tablet 650 mg (650 mg Oral Given 08/21/22 2210)    Mobility walks with device/used wheelchair to assist to the bathroom     Focused Assessments Previous dog bite a month ago. Dog bite has since got infected and being admitted for IV antibiotics.   R Recommendations: See Admitting Provider Note  Report given to:   Additional Notes: Vancomycin is currently running at this time,

## 2022-08-23 DIAGNOSIS — M7989 Other specified soft tissue disorders: Secondary | ICD-10-CM | POA: Diagnosis not present

## 2022-08-23 DIAGNOSIS — W540XXA Bitten by dog, initial encounter: Secondary | ICD-10-CM

## 2022-08-23 DIAGNOSIS — T148XXA Other injury of unspecified body region, initial encounter: Secondary | ICD-10-CM

## 2022-08-23 DIAGNOSIS — I890 Lymphedema, not elsewhere classified: Secondary | ICD-10-CM

## 2022-08-23 DIAGNOSIS — W540XXD Bitten by dog, subsequent encounter: Secondary | ICD-10-CM | POA: Diagnosis not present

## 2022-08-23 LAB — CULTURE, BLOOD (ROUTINE X 2): Special Requests: ADEQUATE

## 2022-08-23 LAB — HIV ANTIBODY (ROUTINE TESTING W REFLEX): HIV Screen 4th Generation wRfx: NONREACTIVE

## 2022-08-23 MED ORDER — FERROUS GLUCONATE 324 (38 FE) MG PO TABS: 324.0000 mg | ORAL_TABLET | Freq: Every day | ORAL | 3 refills | Status: AC

## 2022-08-23 MED ORDER — CYANOCOBALAMIN 500 MCG PO TABS: 500.0000 ug | ORAL_TABLET | Freq: Every day | ORAL | 0 refills | Status: AC

## 2022-08-23 MED ORDER — OXYCODONE HCL 5 MG PO TABS: 5.0000 mg | ORAL_TABLET | Freq: Four times a day (QID) | ORAL | 0 refills | Status: DC | PRN

## 2022-08-23 NOTE — Plan of Care (Signed)
Pt ready to DC home with husband 

## 2022-08-23 NOTE — Discharge Summary (Addendum)
Physician Discharge Summary   Patient: Amy Guerrero MRN: [id number] DOB: 05-27-1980  Admit date:     08/21/2022  Discharge date: 08/23/22  Discharge Physician: Meredeth Ide   PCP: Rema Fendt, NP   Recommendations at discharge:   Outpatient neurology referral if no improvement in numbness with therapy Outpatient OT Stop antibiotics  Discharge Diagnoses: Principal Problem:   Dog bite Active Problems:   Lymphedema   Nerve damage Iron-deficiency anemia  Resolved Problems:   * No resolved hospital problems. *  Hospital Course: 42 y.o. female with medical history significant of asthma who presents the emergency department due to worsening dog bite.  Initial injury occurred on 6/2.  She states that she was assisting her dog out of a cage when he became stuck and bit her leg.  She presented to the ER on June 2.  She was given a 10-day course of Augmentin.  Unfortunately her wounds seem to persist so she presented on 6/9.  She was given doxycycline and Bactrim.  She then saw family medicine on 6/14 for follow-up.  She continued to have pain and complaints so she saw orthopedics on 6/26.  MRI was taken which showed puncture wounds with concern for infectious myositis without evidence of abscess.  Patient was recommended to present to the ER. Patient started on vancomycin and Zosyn due to failed outpatient treatment.    Assessment and Plan:  Left lower extremity swelling -Dog bite on 6/2; has been on antibiotics for past 1 month -Does not appear to be septic; no erythema or tenderness in left lower extremity, healed lesions -Swelling likely developing lymphedema -ID consult for further recommendations for antibiotics, did not recommend antibiotics.  Antibiotics discontinued. -MRI of left lower extremity on 7/3 showed muscle edema; no drainable abscess, no osteomyelitis -OT/PT consultation obtained, patient given rolling walker and will be discharged with outpatient OT consult    Microcytic anemia/iron deficiency anemia -Hemoglobin 9.8 -Anemia panel showed iron deficiency with ferritin of 10 -Will discharge on ferrous gluconate 324 mg p.o. daily  Numbness of left lower extremity -Secondary to dog bite -Patient has swelling of left lower extremity -Started on Neurontin and also started on physical therapy, Occupational Therapy -Will start B12 500 mcg daily for 30 days, B12 level 248 -If no improvement with above measures, she will need outpatient neurology referral    Chronic pain syndrome -Continue gabapentin -Will discharge on oxycodone 5 mg every 6 hours as needed for pain        Consultants:  Procedures performed:  Disposition: Home Diet recommendation:  Discharge Diet Orders (From admission, onward)     Start     Ordered   08/23/22 0000  Diet - low sodium heart healthy        08/23/22 1410           Regular diet DISCHARGE MEDICATION: Allergies as of 08/23/2022       Reactions   Vancomycin Itching, Rash        Medication List     STOP taking these medications    amoxicillin-clavulanate 500-125 MG tablet Commonly known as: Augmentin   doxycycline 100 MG tablet Commonly known as: VIBRA-TABS   HYDROcodone-acetaminophen 5-325 MG tablet Commonly known as: NORCO/VICODIN   hydrocortisone 2.5 % rectal cream Commonly known as: Anusol-HC   meloxicam 15 MG tablet Commonly known as: MOBIC   mupirocin ointment 2 % Commonly known as: BACTROBAN       TAKE these medications    AeroChamber Plus  inhaler Use as instructed   albuterol (2.5 MG/3ML) 0.083% nebulizer solution Commonly known as: PROVENTIL Take 3 mLs (2.5 mg total) by nebulization every 6 (six) hours as needed for wheezing or shortness of breath.   albuterol 108 (90 Base) MCG/ACT inhaler Commonly known as: VENTOLIN HFA Inhale 2 puffs into the lungs every 6 (six) hours as needed for wheezing or shortness of breath.   Cetirizine HCl 10 MG Caps Take 1 capsule (10 mg  total) by mouth daily.   cyanocobalamin 500 MCG tablet Commonly known as: VITAMIN B12 Take 1 tablet (500 mcg total) by mouth daily.   cyclobenzaprine 5 MG tablet Commonly known as: FLEXERIL Take 1 tablet (5 mg total) by mouth 2 (two) times daily as needed for muscle spasms.   ferrous gluconate 324 MG tablet Commonly known as: FERGON Take 1 tablet (324 mg total) by mouth daily with breakfast.   gabapentin 300 MG capsule Commonly known as: NEURONTIN Take 1 capsule (300 mg total) by mouth 3 (three) times daily.   ondansetron 8 MG disintegrating tablet Commonly known as: ZOFRAN-ODT Take 1 tablet (8 mg total) by mouth every 8 (eight) hours as needed for nausea.   oxyCODONE 5 MG immediate release tablet Commonly known as: Oxy IR/ROXICODONE Take 1 tablet (5 mg total) by mouth every 6 (six) hours as needed for moderate pain.               Durable Medical Equipment  (From admission, onward)           Start     Ordered   08/22/22 1333  For home use only DME [employer]  Once       Question Answer Comment  Patient needs a walker to treat with the following condition Dog bite   Patient needs a walker to treat with the following condition Myositis      08/22/22 1333            Follow-up Information     Rema Fendt, NP Follow up in 1 week(s).   Specialty: Nurse Practitioner Contact information: [address] [place] [address] [phone]                Discharge Exam: Ceasar Mons Weights   08/21/22 2101  Weight: 69.4 kg   General-appears in no acute distress Heart-S1-S2, regular, no murmur auscultated Lungs-clear to auscultation bilaterally, no wheezing or crackles auscultated Abdomen-soft, nontender, no organomegaly Extremities-no edema in the lower extremities Neuro-alert, oriented x3, no focal deficit noted  Condition at discharge: good  The results of significant diagnostics from this hospitalization (including imaging,  microbiology, ancillary and laboratory) are listed below for reference.   Imaging Studies: MR TIBIA FIBULA LEFT WO CONTRAST  Result Date: 08/19/2022 CLINICAL DATA:  Soft tissue infection status post dog bite. EXAM: MRI OF LOWER LEFT EXTREMITY WITHOUT CONTRAST TECHNIQUE: Multiplanar, multisequence MR imaging of the left lower leg was performed. No intravenous contrast was administered. COMPARISON:  None Available. FINDINGS: Bones/Joint/Cartilage No fracture or dislocation. Normal alignment. No joint effusion. No marrow signal abnormality. Ligaments Collateral ligaments are intact. Muscles and Tendons Muscle edema in the soleus muscle, flexor hallucis longus muscle, and flexor digitorum longus muscle concerning for infectious myositis given the puncture wounds. No intramuscular fluid collection. Soft tissue No fluid collection or hematoma. No soft tissue mass. Puncture wound along the anteromedial proximal-mid left lower leg and posterior proximal-mid lower leg with skin thickening concerning for cellulitis. IMPRESSION: 1. Puncture wound along the anteromedial proximal-mid  left lower leg and posterior proximal-mid lower leg with skin thickening concerning for cellulitis. Muscle edema in the soleus muscle, flexor hallucis longus muscle, and flexor digitorum longus muscle concerning for infectious myositis given the puncture wounds. No intramuscular fluid collection to suggest an abscess. 2. No evidence of osteomyelitis of the left tibia and fibula. Electronically Signed   By: Elige Ko M.D.   On: 08/19/2022 18:04    Microbiology: Results for orders placed or performed during the hospital encounter of 08/21/22  Blood culture (routine x 2)     Status: None (Preliminary result)   Collection Time: 08/21/22  9:51 PM   Specimen: BLOOD LEFT ARM  Result Value Ref Range Status   Specimen Description   Final    BLOOD LEFT ARM Performed at [facility], [address], [place], [place] [address]     Special Requests   Final    Blood Culture adequate volume BOTTLES DRAWN AEROBIC AND ANAEROBIC Performed at [facility], [address] [place], [place] [address]    Culture   Final    NO GROWTH 2 DAYS Performed at [facility], [address] [place], [place] [address]    Report Status PENDING  Incomplete  Blood culture (routine x 2)     Status: None (Preliminary result)   Collection Time: 08/21/22  9:52 PM   Specimen: BLOOD RIGHT ARM  Result Value Ref Range Status   Specimen Description   Final    BLOOD RIGHT ARM Performed at [facility], [address], [place], [place] [address]    Special Requests   Final    Blood Culture adequate volume BOTTLES DRAWN AEROBIC AND ANAEROBIC Performed at [facility], [address] [place], [place] [address]    Culture   Final    NO GROWTH 2 DAYS Performed at [facility], [address] [place], [place] [address]    Report Status PENDING  Incomplete    Labs: CBC: Recent Labs  Lab 08/21/22 2153 08/21/22 2350  WBC 2.6* 3.2*  HGB 10.5* 9.8*  HCT 33.7* 32.1*  MCV 74.6* 74.8*  PLT 237 217   Basic Metabolic Panel: Recent Labs  Lab 08/21/22 2153 08/21/22 2350  NA 136  --   K 3.6  --   CL 105  --   CO2 23  --   GLUCOSE 88  --   BUN 17  --   CREATININE 0.98 1.02*  CALCIUM 8.1*  --    Liver Function Tests: No results for input(s): "AST", "ALT", "ALKPHOS", "BILITOT", "PROT", "ALBUMIN" in the last 168 hours. CBG: No results for input(s): "GLUCAP" in the last 168 hours.  Discharge time spent: greater than 30 minutes.  Signed: Meredeth Ide, MD [facility] 08/23/2022

## 2022-08-23 NOTE — Evaluation (Signed)
Occupational Therapy Evaluation Patient Details Name: KAYLI MILESKI MRN: [id number] DOB: 26-May-1980 Today's Date: 08/23/2022   History of Present Illness Paitnet is a 42 year old female who was admitted secondary to infected left lower extremity from dog bite with h/o falls at home.   Clinical Impression   Patient is a 42 year old female who was admitted for above. Patient was living at home independently working for [employer] prior to hospitalization. Currently, patient needs supervision for ADLs with patient unable to tolerate RLE on floor without increased pain. Patient was educated on slowly increasing time of RLE on floor and using one hand support on RW at least when it was on floor incase of spasms. Patient verbalized and demonstrated understanding. Patient plans to transition home with roommate and family support at time of d/c. Patient would benefit from [facility] services for environmental modifications, standing balance, and increasing functional activity tolerance.  Patient would continue to benefit from skilled OT services at this time while admitted and after d/c to address noted deficits in order to improve overall safety and independence in ADLs.       Recommendations for follow up therapy are one component of a multi-disciplinary discharge planning process, led by the attending physician.  Recommendations may be updated based on patient status, additional functional criteria and insurance authorization.   Assistance Recommended at Discharge Frequent or constant Supervision/Assistance  Patient can return home with the following A little help with walking and/or transfers;A little help with bathing/dressing/bathroom;Assistance with cooking/housework;Assist for transportation;Help with stairs or ramp for entrance    Functional Status Assessment  Patient has had a recent decline in their functional status and demonstrates the ability to make significant improvements in function in a reasonable  and predictable amount of time.  Equipment Recommendations  None recommended by OT       Precautions / Restrictions Precautions Precautions: Fall Restrictions Weight Bearing Restrictions: No      Mobility Bed Mobility Overal bed mobility: Modified Independent              Balance Overall balance assessment: Mild deficits observed, not formally tested           ADL either performed or assessed with clinical judgement   ADL Overall ADL's : At baseline       General ADL Comments: patient was abel to complete bed mobility with MI, transfers from bed to commode in bathroom with RW with NWB on LLE, and LB dressing tasks. patient was able to stand on RLE and balance while completing hand hygiene two steps away from sink with no LOB. patient reproted she knew she should have moved closer to the sink when therapist started to speak and reported she did not want to place dirty hands on walker. patient was educated on starting to place foot down on floor to increase standing balance. patient verbalized understanding reporting increased pain in back of leg near achillies tendon with placing foot on floor with spasm like pain. patient was encoueraged to try a little each time. patient verbalized understanding. patient reported that she missed going to work. patient was educated on how that was valid feeling as it is part of her independence that she is limited in participating in at this time. patient expressed desires to conitnue walking. patient was educated on continued attempts to place foot on floor and use LLE in functional mobility. patient verbalized and demonstrated understanding. patient was educated on walker tray use or bags to transport items from one  area of house to the other. patient verbalized understanding reporting she typically uses her rollator. patient was educated that rollator was not the same as RW as you cannot WB through it as well. patient verbalized understanding.  patient was educated on chaplin services and importance of voicing frustrations v.s. letting them getting bottled up. patient verbalized understanding but declined to have a chat with them. patient reported that she practiced getting into shower in room already and she did not need to practice with therapist at this time.     Vision Patient Visual Report: No change from baseline       Perception     Praxis      Pertinent Vitals/Pain Pain Assessment Pain Assessment: Faces Faces Pain Scale: Hurts even more Pain Location: left ankle Pain Descriptors / Indicators: Grimacing, Guarding Pain Intervention(s): Limited activity within patient's tolerance, Monitored during session, Repositioned     Hand Dominance     Extremity/Trunk Assessment Upper Extremity Assessment Upper Extremity Assessment: Overall WFL for tasks assessed   Lower Extremity Assessment Lower Extremity Assessment: Defer to PT evaluation   Cervical / Trunk Assessment Cervical / Trunk Assessment: Normal   Communication Communication Communication: No difficulties   Cognition Arousal/Alertness: Awake/alert Behavior During Therapy: WFL for tasks assessed/performed Overall Cognitive Status: Within Functional Limits for tasks assessed       General Comments: plesant and cooperative during session. noted to be tearful at times during session regarding not being back to independent. patients thoughts and concerns were heard and validated.                Home Living Family/patient expects to be discharged to:: Private residence Living Arrangements: Other (Comment) (rooommate)   Type of Home: House Home Access: Stairs to enter Secretary/administrator of Steps: 4 Entrance Stairs-Rails: Right Home Layout: One level     Bathroom Shower/Tub: Tub/shower unit;Curtain         Home Equipment: Crutches;Rollator (4 wheels);Shower seat   Additional Comments: patient reported that her niece and nephew come over to  check on her daily.      Prior Functioning/Environment Prior Level of Function : Independent/Modified Independent              OT Problem List: Decreased activity tolerance;Impaired balance (sitting and/or standing);Decreased knowledge of use of DME or AE      OT Treatment/Interventions: Self-care/ADL training;DME and/or AE instruction;Patient/family education;Balance training    OT Goals(Current goals can be found in the care plan section) Acute Rehab OT Goals Patient Stated Goal: to get back to being independent OT Goal Formulation: With patient Time For Goal Achievement: 09/06/22 Potential to Achieve Goals: Fair  OT Frequency: Min 1X/week       AM-PAC OT "6 Clicks" Daily Activity     Outcome Measure Help from another person eating meals?: None Help from another person taking care of personal grooming?: None Help from another person toileting, which includes using toliet, bedpan, or urinal?: A Little Help from another person bathing (including washing, rinsing, drying)?: A Little Help from another person to put on and taking off regular upper body clothing?: None Help from another person to put on and taking off regular lower body clothing?: A Little 6 Click Score: 21   End of Session Equipment Utilized During Treatment: Gait belt;Rolling walker (2 wheels)  Activity Tolerance: Patient tolerated treatment well;Patient limited by pain Patient left: in bed;with call bell/phone within reach;with family/visitor present  OT Visit Diagnosis: Unsteadiness on feet (R26.81);Pain Pain - Right/Left:  Right Pain - part of body: Leg;Ankle and joints of foot                Time: 1130-1150 OT Time Calculation (min): 20 min Charges:  OT General Charges $OT Visit: 1 Visit OT Evaluation $OT Eval Low Complexity: 1 Low  Danai Gotto OTR/L, MS Acute Rehabilitation Department Office# [phone]   Selinda Flavin 08/23/2022, 12:01 PM

## 2022-08-23 NOTE — Progress Notes (Addendum)
Physical Therapy Treatment Patient Details Name: Amy Guerrero MRN: [id number] DOB: Aug 18, 1980 Today's Date: 08/23/2022   History of Present Illness Pt admitted 2* L LE infected dog bite    PT Comments  Pt progressing well, much more stable with RW;  pt did stairs yesterday without difficulty    Assistance Recommended at Discharge PRN  If plan is discharge home, recommend the following:  Can travel by private vehicle    A little help with walking and/or transfers;Assist for transportation;Help with stairs or ramp for entrance;Assistance with cooking/housework      Equipment Recommendations  Rolling walker (2 wheels) (youth level)    Recommendations for Other Services OT consult     Precautions / Restrictions Precautions Precautions: Fall Restrictions Weight Bearing Restrictions: No     Mobility  Bed Mobility Overal bed mobility: Modified Independent                  Transfers Overall transfer level: Needs assistance Equipment used: Rolling walker (2 wheels), Crutches Transfers: Sit to/from Stand Sit to Stand: Supervision           General transfer comment: min cues for LE management and use of UEs to self assist and improve stability in transfers    Ambulation/Gait Ambulation/Gait assistance: Min guard, Supervision Gait Distance (Feet): 140 Feet Assistive device: Rolling walker (2 wheels) Gait Pattern/deviations: Step-to pattern, Shuffle, Trunk flexed       General Gait Details: cues for RW position and upward gaze; steady gait with RW, pt amb NWB d/t pain L ankle pain   Stairs             Wheelchair Mobility     Tilt Bed    Modified Rankin (Stroke Patients Only)       Balance Overall balance assessment: Mild deficits observed, not formally tested                                          Cognition Arousal/Alertness: Awake/alert Behavior During Therapy: WFL for tasks assessed/performed Overall Cognitive  Status: Within Functional Limits for tasks assessed                                          Exercises General Exercises - Lower Extremity Ankle Circles/Pumps: AAROM, 10 reps, Supine, Left Other Exercises Other Exercises: worked on sitting heel to floor;  standing heel to floor; passive L ankle df stretch    General Comments        Pertinent Vitals/Pain Pain Assessment Pain Assessment: 0-10 Pain Score: 7  Pain Location: left ankle Pain Descriptors / Indicators: Grimacing, Guarding Pain Intervention(s): Limited activity within patient's tolerance, Monitored during session, Repositioned    Home Living                          Prior Function            PT Goals (current goals can now be found in the care plan section) Acute Rehab PT Goals Patient Stated Goal: REgain IND PT Goal Formulation: With patient Time For Goal Achievement: 09/05/22 Potential to Achieve Goals: Good Progress towards PT goals: Progressing toward goals    Frequency    Min 1X/week      PT Plan  Co-evaluation              AM-PAC PT "6 Clicks" Mobility   Outcome Measure  Help needed turning from your back to your side while in a flat bed without using bedrails?: None Help needed moving from lying on your back to sitting on the side of a flat bed without using bedrails?: None Help needed moving to and from a bed to a chair (including a wheelchair)?: A Little Help needed standing up from a chair using your arms (e.g., wheelchair or bedside chair)?: A Little Help needed to walk in hospital room?: A Little Help needed climbing 3-5 steps with a railing? : A Little 6 Click Score: 20    End of Session Equipment Utilized During Treatment: Gait belt Activity Tolerance: Patient tolerated treatment well Patient left: in bed;with call bell/phone within reach;with family/visitor present Nurse Communication: Mobility status PT Visit Diagnosis: Unsteadiness on feet  (R26.81);Difficulty in walking, not elsewhere classified (R26.2)     Time: 1100-1120 PT Time Calculation (min) (ACUTE ONLY): 10 min  Charges:    $ gait PT General Charges $$ ACUTE PT VISIT: 1 Visit                     Lillard Bailon, PT  Acute Rehab Dept [facility]) [phone]  08/23/2022    [facility] 08/23/2022, 11:36 AM

## 2022-08-23 NOTE — Discharge Instructions (Signed)
If no improvement in numbness of left foot with therapy, you will need outpatient neurology consultation.  Your PCP can make referral.

## 2022-08-23 NOTE — TOC Transition Note (Signed)
Transition of Care [facility]) - CM/SW Discharge Note   Patient Details  Name: Amy Guerrero MRN: [id number] Date of Birth: 1981-01-27  Transition of Care [facility]) CM/SW Contact:  Carmina Miller, LCSWA Phone Number: 08/23/2022, 2:34 PM   Clinical Narrative:     [facility]  REHABILITATION SERVICES REFERRAL        Occupational Therapy * Physical Therapy * Speech Therapy                           DATE 08/23/22  PATIENT NAME [name]  PATIENT MRN [id number]       DIAGNOSIS/DIAGNOSIS CODE   DATE OF DISCHARGE: 08/23/22       PRIMARY CARE PHYSICIAN      PCP PHONE/FAX      Dear Provider,   I certify that I have examined this patient and that occupational/physical/speech therapy is necessary on an outpatient basis.    The patient has expressed interest in completing their recommended course of therapy at your  location.  Once a formal order from the patient's primary care physician has been obtained, please  contact him/her to schedule an appointment for evaluation at your earliest convenience.   [x ]  Physical Therapy Evaluate and Treat  [  ]  Occupational Therapy Evaluate and Treat  [  ]  Speech Therapy Evaluate and Treat         Barriers to Discharge: No Barriers Identified   Patient Goals and CMS Choice      Discharge Placement                         Discharge Plan and Services Additional resources added to the After Visit Summary for   In-house Referral: Clinical Social Work              DME Arranged: Dan Humphreys youth DME Agency: [employer] Date DME Agency Contacted: 08/22/22 Time DME Agency Contacted: 1356 Representative spoke with at DME Agency: Leavy Cella            Social Determinants of Health (SDOH) Interventions SDOH Screenings   Food Insecurity: No Food Insecurity (08/22/2022)  Housing: Low Risk  (08/22/2022)  Transportation Needs: No Transportation Needs (08/22/2022)  Utilities: Not At Risk (08/22/2022)  Alcohol Screen: Low Risk   (08/10/2022)  Depression (PHQ2-9): Low Risk  (07/31/2022)  Financial Resource Strain: Medium Risk (08/10/2022)  Physical Activity: Unknown (08/10/2022)  Social Connections: Socially Isolated (08/10/2022)  Stress: Stress Concern Present (08/10/2022)  Tobacco Use: Low Risk  (08/21/2022)     Readmission Risk Interventions     No data to display

## 2022-08-24 ENCOUNTER — Telehealth: Payer: Self-pay

## 2022-08-24 LAB — CULTURE, BLOOD (ROUTINE X 2)
Culture: NO GROWTH
Culture: NO GROWTH

## 2022-08-24 NOTE — Transitions of Care (Post Inpatient/ED Visit) (Signed)
08/24/2022  Name: Amy Guerrero MRN: [id number] DOB: 10-20-1980  Today's TOC FU Call Status: Today's TOC FU Call Status:: Successful TOC FU Call Competed TOC FU Call Complete Date: 08/24/22  Transition Care Management Follow-up Telephone Call Date of Discharge: 08/23/22 Discharge Facility: [facility]) Type of Discharge: Inpatient Admission Primary Inpatient Discharge Diagnosis:: dog bite How have you been since you were released from the hospital?: Better Any questions or concerns?: Yes Patient Questions/Concerns:: She has paperwork regarding her disability claim and she needs to have PCP email on the documents.  I told her that she can email them to me and I will forward them to Amy. She then said that she is not sure that will work. I asked about sending them via MyChart and at first said she couldn't do that and then said that she would try.  I told her that I would need to check with Amy before giving her Amy's email. Patient Questions/Concerns Addressed: Notified Provider of Patient Questions/Concerns  Items Reviewed: Did you receive and understand the discharge instructions provided?: Yes Medications obtained,verified, and reconciled?: No Medications Not Reviewed Reasons:: Other: (She said she has all of her medications as well as a nebuizer and she  did not have any questions about the med regime and did not need to review the med list) Any new allergies since your discharge?: No Dietary orders reviewed?: Yes Type of Diet Ordered:: heart healthy Do you have support at home?: Yes  Medications Reviewed Today: Medications Reviewed Today     Reviewed by Valda Lamb, CPhT (Pharmacy Technician) on 08/22/22 at 0027  Med List Status: Complete   Medication Order Taking? Sig Documenting Provider Last Dose Status Informant  albuterol (PROVENTIL) (2.5 MG/3ML) 0.083% nebulizer solution [id number] Yes Take 3 mLs (2.5 mg total) by nebulization every 6 (six) hours as needed  for wheezing or shortness of breath. Rema Fendt, NP Past Week Active Self  albuterol (VENTOLIN HFA) 108 (90 Base) MCG/ACT inhaler [id number] Yes Inhale 2 puffs into the lungs every 6 (six) hours as needed for wheezing or shortness of breath. Rema Fendt, NP Past Week Active Self  amoxicillin-clavulanate (AUGMENTIN) 500-125 MG tablet [id number] Yes Take 1 tablet by mouth 3 (three) times daily. Adonis Huguenin, NP 08/21/2022 Active Self           Med Note Bonna Gains I   Sat Aug 22, 2022 12:24 AM) ABT Start Date 08/12/22.  Cetirizine HCl 10 MG CAPS [id number]  Take 1 capsule (10 mg total) by mouth daily. Rema Fendt, NP  Expired 03/26/22 2359   cyclobenzaprine (FLEXERIL) 5 MG tablet [id number] No Take 1 tablet (5 mg total) by mouth 2 (two) times daily as needed for muscle spasms.  Patient not taking: Reported on 07/31/2022   Gustavus Bryant, FNP Not Taking Active Self  doxycycline (VIBRA-TABS) 100 MG tablet [id number] Yes Take 1 tablet (100 mg total) by mouth 2 (two) times daily. Adonis Huguenin, NP 08/20/2022 Active Self           Med Note Cyndie Chime, [place] I   Sat Aug 22, 2022 12:25 AM) ABT Start Date 08/12/22.  gabapentin (NEURONTIN) 300 MG capsule [id number] Yes Take 1 capsule (300 mg total) by mouth 3 (three) times daily. Rema Fendt, NP 08/21/2022 Active Self  HYDROcodone-acetaminophen (NORCO/VICODIN) 5-325 MG tablet [id number] No Take 1 tablet by mouth every 6 (six) hours as needed (pain).  Patient not taking: Reported on 07/31/2022   Loreta Ave  K, MD Not Taking Active Self  hydrocortisone (ANUSOL-HC) 2.5 % rectal cream [id number] No Place 1 application rectally 2 (two) times daily.  Patient not taking: Reported on 08/22/2022   Rema Fendt, NP Completed Course Active Self  meloxicam (MOBIC) 15 MG tablet [id number] No Take 1 tablet (15 mg total) by mouth daily.  Patient not taking: Reported on 07/31/2022   Lenda Kelp, MD Not Taking Active Self  mupirocin ointment  (BACTROBAN) 2 % [id number] Yes Apply 1 Application topically 2 (two) times daily.  Patient taking differently: Apply 1 Application topically daily.   Gustavus Bryant, [place] 08/21/2022 Active Self  ondansetron (ZOFRAN-ODT) 8 MG disintegrating tablet [id number] No Take 1 tablet (8 mg total) by mouth every 8 (eight) hours as needed for nausea.  Patient not taking: Reported on 07/31/2022   Derwood Kaplan, MD Completed Course Active Self  Spacer/Aero-Holding Chambers (AEROCHAMBER PLUS) inhaler [id number]  Use as instructed  Patient not taking: Reported on 07/31/2022   Rema Fendt, NP  Active Self            Home Care and Equipment/Supplies: Were Home Health Services Ordered?: No Any new equipment or medical supplies ordered?: Yes Name of Medical supply agency?: [facility] [employer] Were you able to get the equipment/medical supplies?: Yes Do you have any questions related to the use of the equipment/supplies?: No  Functional Questionnaire: Do you need assistance with bathing/showering or dressing?: No Do you need assistance with meal preparation?: No (She has been managing the best she can) Do you need assistance with eating?: No Do you have difficulty maintaining continence: No Do you need assistance with getting out of bed/getting out of a chair/moving?: Yes (has RW to use with ambulation) Do you have difficulty managing or taking your medications?: No  Follow up appointments reviewed: PCP Follow-up appointment confirmed?: Yes Date of PCP follow-up appointment?: 08/28/22 Follow-up Provider: Ricky Stabs, NP Specialist Hospital Follow-up appointment confirmed?: NA Do you need transportation to your follow-up appointment?: No Do you understand care options if your condition(s) worsen?: Yes-patient verbalized understanding    SIGNATURE  Robyne Peers, RN

## 2022-08-24 NOTE — Telephone Encounter (Signed)
Amy Guerrero,  This is from the [facility] call.  She has an appt with you on 08/28/2022.   She has paperwork regarding her disability claim and she needs to have PCP email on the documents. I told her that she can email them to me and I will forward them to Amy Guerrero. She then said that she is not sure that will work. I asked about sending them via MyChart and at first said she couldn't do that and then said that she would try. I told her that I would need to check with Amy Guerrero before giving her Amy Guerrero's email.

## 2022-08-25 LAB — CULTURE, BLOOD (ROUTINE X 2): Special Requests: ADEQUATE

## 2022-08-25 NOTE — Telephone Encounter (Signed)
Disability paperwork may be best completed by Orthopedics due to disability is related to a condition they are managing. However, if Orthopedics prefers for Primary Care to complete please ask them to send me details regarding patient's disability, expected duration of disability, instructions of medical clearance to return to work, and any additional information that will assist with completion of paperwork. I do not accept paperwork/forms through email. If patient can please fax to office or bring copy to office. Note - I discussed in detail plan of care with Georganna Skeans, MD.

## 2022-08-26 NOTE — Progress Notes (Addendum)
TRANSITION OF CARE VISIT   Date of Admission: 08/21/2022   Date of Discharge: 08/23/2022  Transitions of Care Call: 08/24/2022  Discharged from: [facility]   Discharge Diagnosis:  Principal Problem:   Dog bite Active Problems:   Lymphedema   Nerve damage Iron-deficiency anemia  Summary of Admission per MD note: Assessment and Plan:   Left lower extremity swelling -Dog bite on 6/2; has been on antibiotics for past 1 month -Does not appear to be septic; no erythema or tenderness in left lower extremity, healed lesions -Swelling likely developing lymphedema -ID consult for further recommendations for antibiotics, did not recommend antibiotics.  Antibiotics discontinued. -MRI of left lower extremity on 7/3 showed muscle edema; no drainable abscess, no osteomyelitis -OT/PT consultation obtained, patient given rolling walker and will be discharged with outpatient OT consult   Microcytic anemia/iron deficiency anemia -Hemoglobin 9.8 -Anemia panel showed iron deficiency with ferritin of 10 -Will discharge on ferrous gluconate 324 mg p.o. daily   Numbness of left lower extremity -Secondary to dog bite -Patient has swelling of left lower extremity -Started on Neurontin and also started on physical therapy, Occupational Therapy -Will start B12 500 mcg daily for 30 days, B12 level 248 -If no improvement with above measures, she will need outpatient neurology referral    Chronic pain syndrome -Continue gabapentin -Will discharge on oxycodone 5 mg every 6 hours as needed for pain   Today's visit 08/28/2022: Patient presents for hospital discharge follow-up. She is accompanied by her significant other. States she is feeling improved since hospital discharge. She is taking medications as prescribed. Needs referrals to Orthopedics, PT, and OT. Has Short-Term Disability Claim Form today in office for completion. 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 Requests handicap placard. No further issues/concerns for discussion today.   Patient/Caregiver self-reported problems/concerns: see above   MEDICATIONS  Medication Reconciliation conducted with patient/caregiver? (Yes/ No): Yes  New medications prescribed/discontinued upon discharge? (Yes/No): Yes  Barriers identified related to medications: No   LABS  Lab Reviewed (Yes/No/NA): Yes  PHYSICAL EXAM:  Physical Exam HENT:     Head: Normocephalic and atraumatic.     Nose: Nose normal.     Mouth/Throat:     Mouth: Mucous membranes are moist.     Pharynx: Oropharynx is clear.  Eyes:     Extraocular Movements: Extraocular movements intact.     Conjunctiva/sclera: Conjunctivae normal.     Pupils: Pupils are equal, round, and reactive to light.  Cardiovascular:     Rate and Rhythm: Normal rate and regular rhythm.     Pulses: Normal pulses.     Heart sounds: Normal heart sounds.  Pulmonary:     Effort: Pulmonary effort is normal.     Breath sounds: Normal breath sounds.  Musculoskeletal:     Right shoulder: Normal.     Left shoulder: Normal.     Right upper arm: Normal.     Left upper arm: Normal.     Right elbow: Normal.     Left elbow: Normal.     Right forearm: Normal.     Left forearm: Normal.     Right wrist: Normal.     Left wrist: Normal.     Right hand: Normal.     Left hand: Normal.     Cervical back: Normal, normal range of motion and neck supple.     Thoracic back: Normal.     Lumbar back: Normal.  Right hip: Normal.     Left hip: Normal.     Right upper leg: Normal.     Left upper leg: Normal.     Right knee: Normal.     Left knee: Normal.     Right lower leg: Normal.     Right ankle: Normal.     Left ankle: Normal.     Right foot: Normal.     Left foot: Normal.     Comments: Left lower extremity with healed wounds anterior and posterior, no drainage, no additional presentation.   Skin:    General: Skin  is warm and dry.  Neurological:     General: No focal deficit present.     Mental Status: She is alert and oriented to person, place, and time.  Psychiatric:        Mood and Affect: Mood normal.        Behavior: Behavior normal.     ASSESSMENT AND PLAN: 1. Hospital discharge follow-up - Reviewed hospital course, current medications, ensured proper follow-up in place, and addressed concerns.   2. Dog bite, sequela 3. Lymphedema 4. Nerve damage - Continue present management.  - Referral to Occupation Therapy, Physical Therapy, and Orthopedics for further evaluation/management.  - Ambulatory referral to Occupational Therapy - Ambulatory referral to Physical Therapy - Ambulatory referral to Orthopedics  5. Iron deficiency anemia, unspecified iron deficiency anemia type - Continue present management.  - Follow-up with primary provider in 4 weeks or sooner if needed.   6. Encounter for completion of form with patient - Short-Term Disability Claim Form - Initial Report of Disability (to begin 07/19/2022) completed today in office.  - Handicap placard (6 months) completed today in office. - Patient reports Orthopedics to complete additional forms.   PATIENT EDUCATION PROVIDED: See AVS   FOLLOW-UP (Include any further testing or referrals):  - Referral to Occupational Therapy.  - Referral to Physical Therapy.  - Referral to Orthopedics. - Follow-up with primary provider as scheduled.   Patient was given clear instructions to go to Emergency Department or return to medical center if symptoms don't improve, worsen, or new problems develop.The patient verbalized understanding.

## 2022-08-28 ENCOUNTER — Ambulatory Visit (INDEPENDENT_AMBULATORY_CARE_PROVIDER_SITE_OTHER): Payer: BC Managed Care – PPO | Admitting: Family

## 2022-08-28 ENCOUNTER — Encounter: Payer: Self-pay | Admitting: Family

## 2022-08-28 VITALS — BP 127/81 | HR 73 | Temp 97.7°F | Ht 61.0 in | Wt 165.0 lb

## 2022-08-28 DIAGNOSIS — Z09 Encounter for follow-up examination after completed treatment for conditions other than malignant neoplasm: Secondary | ICD-10-CM

## 2022-08-28 DIAGNOSIS — D509 Iron deficiency anemia, unspecified: Secondary | ICD-10-CM

## 2022-08-28 DIAGNOSIS — T148XXA Other injury of unspecified body region, initial encounter: Secondary | ICD-10-CM | POA: Diagnosis not present

## 2022-08-28 DIAGNOSIS — I890 Lymphedema, not elsewhere classified: Secondary | ICD-10-CM

## 2022-08-28 DIAGNOSIS — W540XXS Bitten by dog, sequela: Secondary | ICD-10-CM

## 2022-08-28 DIAGNOSIS — Z0289 Encounter for other administrative examinations: Secondary | ICD-10-CM

## 2022-08-28 NOTE — Progress Notes (Signed)
Pt needs referral for outpatient physical therapy.  Pt is asking explanation of hospital diagnosis.

## 2022-09-04 ENCOUNTER — Other Ambulatory Visit: Payer: Self-pay | Admitting: Family

## 2022-09-04 DIAGNOSIS — Z203 Contact with and (suspected) exposure to rabies: Secondary | ICD-10-CM

## 2022-09-04 DIAGNOSIS — W540XXS Bitten by dog, sequela: Secondary | ICD-10-CM

## 2022-09-04 DIAGNOSIS — L089 Local infection of the skin and subcutaneous tissue, unspecified: Secondary | ICD-10-CM

## 2022-09-08 ENCOUNTER — Encounter: Payer: Self-pay | Admitting: Family

## 2022-09-08 ENCOUNTER — Ambulatory Visit (INDEPENDENT_AMBULATORY_CARE_PROVIDER_SITE_OTHER): Payer: BC Managed Care – PPO | Admitting: Family

## 2022-09-08 ENCOUNTER — Other Ambulatory Visit: Payer: Self-pay | Admitting: Family

## 2022-09-08 DIAGNOSIS — M6702 Short Achilles tendon (acquired), left ankle: Secondary | ICD-10-CM

## 2022-09-08 DIAGNOSIS — I890 Lymphedema, not elsewhere classified: Secondary | ICD-10-CM

## 2022-09-08 DIAGNOSIS — W540XXA Bitten by dog, initial encounter: Secondary | ICD-10-CM

## 2022-09-08 MED ORDER — GABAPENTIN 300 MG PO CAPS
300.0000 mg | ORAL_CAPSULE | Freq: Three times a day (TID) | ORAL | 0 refills | Status: DC
Start: 2022-09-08 — End: 2023-06-07

## 2022-09-08 NOTE — Telephone Encounter (Signed)
Complete

## 2022-09-08 NOTE — Progress Notes (Unsigned)
Office Visit Note   Patient: Amy Guerrero           Date of Birth: 04-01-1980           MRN: [id number] Visit Date: 09/08/2022              Requested by: Rema Fendt, NP [address] [place],  [place] [address] PCP: Rema Fendt, NP  Chief Complaint  Patient presents with  . Left Leg - Wound Check      HPI: ***  Assessment & Plan: Visit Diagnoses:  1. Dog bite, initial encounter   2. Heel cord tightness, left     Plan: ***  Follow-Up Instructions: No follow-ups on file.   Ortho Exam  Patient is alert, oriented, no adenopathy, well-dressed, normal affect, normal respiratory effort. ***  Imaging: No results found. No images are attached to the encounter.  Labs: Lab Results  Component Value Date   HGBA1C 5.4 07/17/2020   REPTSTATUS 08/26/2022 FINAL 08/21/2022   GRAMSTAIN  05/24/2007    NO WBC SEEN NO SQUAMOUS EPITHELIAL CELLS SEEN NO ORGANISMS SEEN   CULT  08/21/2022    NO GROWTH 5 DAYS Performed at [facility], [address] [place], [place] [address]      Lab Results  Component Value Date   ALBUMIN 4.4 07/17/2020   ALBUMIN 3.8 06/16/2017   ALBUMIN 2.7 (L) 05/26/2007    Lab Results  Component Value Date   MG 2.2 06/16/2017   No results found for: "VD25OH"  No results found for: "PREALBUMIN"    Latest Ref Rng & Units 08/22/2022    6:52 PM 08/21/2022   11:50 PM 08/21/2022    9:53 PM  CBC EXTENDED  WBC 4.0 - 10.5 K/uL  3.2  2.6   RBC 3.87 - 5.11 MIL/uL 4.44  4.29  4.52   Hemoglobin 12.0 - 15.0 g/dL  9.8  13.2   HCT 44.0 - 46.0 %  32.1  33.7   Platelets 150 - 400 K/uL  217  237      There is no height or weight on file to calculate BMI.  Orders:  Orders Placed This Encounter  Procedures  . Ambulatory referral to Physical Therapy   No orders of the defined types were placed in this encounter.    Procedures: No procedures performed  Clinical Data: No additional findings.  ROS:  All other systems  negative, except as noted in the HPI. Review of Systems  Objective: Vital Signs: LMP 07/24/2022 (Exact Date)   Specialty Comments:  No specialty comments available.  PMFS History: Patient Active Problem List   Diagnosis Date Noted  . Lymphedema 08/22/2022  . Nerve damage 08/22/2022  . Dog bite 08/21/2022  . Essential hypertension 05/07/2020  . Anxiety and depression 05/07/2020   Past Medical History:  Diagnosis Date  . Asthma   . Sinusitis     Family History  Problem Relation Age of Onset  . Diabetes Mother   . Diabetes Father     Past Surgical History:  Procedure Laterality Date  . CARPAL TUNNEL RELEASE Right   . TUBAL LIGATION    . tubes tied     Social History   Occupational History  . Not on file  Tobacco Use  . Smoking status: Never  . Smokeless tobacco: Never  Vaping Use  . Vaping status: Former  Substance and Sexual Activity  . Alcohol use: Yes    Alcohol/week: 1.0 standard  drink of alcohol    Types: 1 Glasses of wine per week    Comment: occassionally  . Drug use: Yes    Types: Marijuana  . Sexual activity: Not on file

## 2022-09-11 ENCOUNTER — Telehealth: Payer: Self-pay | Admitting: Family

## 2022-09-11 ENCOUNTER — Ambulatory Visit (HOSPITAL_BASED_OUTPATIENT_CLINIC_OR_DEPARTMENT_OTHER): Payer: BC Managed Care – PPO | Admitting: Student

## 2022-09-11 NOTE — Telephone Encounter (Signed)
Patient called advised she fell this morning trying to walk without her walker. Patient said she can not walk. Patient said she starts (PT) 09/23/2022. Patient said her foot is swollen and she could not put her shoe. Patient said she can not go back to work  in 4 weeks. Patient said her leg is weak.  The number to contact patient is [phone]

## 2022-09-11 NOTE — Telephone Encounter (Signed)
Patient Significant other dropped off paperwork to be filled out let patient know that we will give it to provider to look over to determine if she needs her to come in for appointment to fill it out or if she can fill it out without appointment, patient stated she needed it filled out as soon as possible because this is to extend her pay while on disability leave. Patient stated she understood and I let her know we will give her a call once provider makes a decision. Gave paperwork to TB with a copy of the other instability forms we faxed in July for patient.

## 2022-09-14 NOTE — Telephone Encounter (Signed)
Patient request call fron CMA

## 2022-09-17 NOTE — Telephone Encounter (Signed)
I tried to call her left Vm to call the office back

## 2022-09-21 NOTE — Therapy (Unsigned)
OUTPATIENT PHYSICAL THERAPY LOWER EXTREMITY EVALUATION   Patient Name: Amy Guerrero MRN: [id number] DOB:January 17, 1981, 42 y.o., female Today's Date: 09/23/2022  END OF SESSION:  PT End of Session - 09/23/22 1206     Visit Number 1    Number of Visits 12    Date for PT Re-Evaluation 11/22/22    Authorization Type BCBS    PT Start Time 1130    PT Stop Time 1215    PT Time Calculation (min) 45 min    Activity Tolerance Patient tolerated treatment well    Behavior During Therapy [facility] for tasks assessed/performed             Past Medical History:  Diagnosis Date   Asthma    Sinusitis    Past Surgical History:  Procedure Laterality Date   CARPAL TUNNEL RELEASE Right    TUBAL LIGATION     tubes tied     Patient Active Problem List   Diagnosis Date Noted   Lymphedema 08/22/2022   Nerve damage 08/22/2022   Dog bite 08/21/2022   Essential hypertension 05/07/2020   Anxiety and depression 05/07/2020    PCP: Amy Fendt, NP   REFERRING PROVIDER: Adonis Huguenin, NP   REFERRING DIAG: [id number] (ICD-10-CM) - Dog bite, initial encounter M67.02 (ICD-10-CM) - Heel cord tightness, left  THERAPY DIAG:  Left Achilles tendinitis - Plan: PT plan of care cert/re-cert  Muscle weakness (generalized) - Plan: PT plan of care cert/re-cert  Other abnormalities of gait and mobility - Plan: PT plan of care cert/re-cert  Rationale for Evaluation and Treatment: Rehabilitation  ONSET DATE: 07/19/22  SUBJECTIVE:   SUBJECTIVE STATEMENT: Reports decreased ankle ROM following dog bite.  Has been able to wean from AD but is considering a cane for stability  PERTINENT HISTORY: HPI: The patient is a 42 year old woman who presents today in follow-up after a dog bite to the left lower leg on June 2.  Completed a course of Augmentin initially after the injury went on to have subsequent cellulitis completed a second course of Augmentin as well as doxycycline continue to have swelling and pain  difficulty bearing weight due to significant pain.   An MRI was concerning for infectious myositis did have another hospitalization was seen by ID.  Antibiotics were stopped.   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 Yes: NPRS scale: 10/10 Pain location: L calf Pain description: ache, burn, tingling Aggravating factors: walking, Weight bearing tasks Relieving factors: nothing  PRECAUTIONS: Fall  RED FLAGS: None   WEIGHT BEARING RESTRICTIONS: No  FALLS:  Has patient fallen in last 6 months? Yes. 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 Puncture wound along the anteromedial proximal-mid left lower leg and posterior proximal-mid lower leg with skin thickening concerning for cellulitis. Muscle edema in the soleus muscle, flexor hallucis longus muscle, and flexor digitorum longus muscle concerning for infectious myositis given the puncture wounds. No intramuscular fluid collection to suggest an abscess. 2. No evidence of osteomyelitis of the left tibia and fibula.     Electronically Signed   By: Alan Ripper  Patel M.D.   On: 08/19/2022 18:04  PATIENT SURVEYS:  FOTO 49(67 predicted)   MUSCLE LENGTH:   POSTURE: No Significant postural limitations  PALPATION: TTP L achilles tendon but not gastroc  LOWER EXTREMITY ROM:  A/PROM Right eval Left eval  Hip flexion    Hip extension    Hip abduction    Hip adduction    Hip internal rotation    Hip external rotation    Knee flexion  WFL  Knee extension  WFL  Ankle dorsiflexion  -6/0d  Ankle plantarflexion  WFL  Ankle inversion  30d  Ankle  eversion  18d   (Blank rows = not tested)  LOWER EXTREMITY MMT:  MMT Right eval Left eval  Hip flexion    Hip extension    Hip abduction    Hip adduction    Hip internal rotation    Hip external rotation    Knee flexion    Knee extension    Ankle dorsiflexion  3+  Ankle plantarflexion  3  Ankle inversion  3+  Ankle eversion  3+   (Blank rows = not tested)  LOWER EXTREMITY SPECIAL TESTS:  N/a  FUNCTIONAL TESTS:  5x STS 24s arms crossed  GAIT: Distance walked: 6ft x2 Assistive device utilized: None Level of assistance: Modified independence Comments: decreased L stance time and push off   TODAY'S TREATMENT:                                                                                                                              DATE: 09/22/22 Eval and HEP    PATIENT EDUCATION:  Education details: Discussed eval findings, rehab rationale and POC and patient is in agreement  Person educated: Patient Education method: Explanation Education comprehension: verbalized understanding and needs further education  HOME EXERCISE PROGRAM: Access Code: [email or web address] URL: [email or web address] Date: 09/23/2022 Prepared by: Gustavus Bryant  Exercises - Long Sitting Plantar Fascia Stretch with Towel  - 2 x daily - 5 x weekly - 1 sets - 2 reps - 30s hold - Heel Toe Raises with Counter Support  - 2 x daily - 5 x weekly - 2 sets - 10 reps - Single Leg Stance with Support  - 2 x daily - 5 x weekly - 1 sets - 2 reps - 10s hold  ASSESSMENT:  CLINICAL IMPRESSION: Patient is a 42 y.o. female who was seen today for physical therapy evaluation and treatment for L ankle and lower leg pain, paresthesias and weakness following a dog bite.  No redness or warmth detected, no marked edema present.  Patient apprehensive about WB on LLE due to pain.  Symptoms localized to achilles tendon primarily but also extend to anterior ankle joint and lateral ankle along peroneal distribution.   Some inconsistencies noted with resisted strength testing.    OBJECTIVE IMPAIRMENTS: Abnormal gait, decreased activity tolerance, decreased balance, decreased knowledge of condition, decreased mobility, difficulty walking, decreased ROM, decreased strength, and pain.  ACTIVITY LIMITATIONS: squatting, stairs, and locomotion level  PERSONAL FACTORS: Age, Fitness, Past/current experiences, and 1 comorbidity: dog bite  are also affecting patient's functional outcome.   REHAB POTENTIAL: Good  CLINICAL DECISION MAKING: Stable/uncomplicated  EVALUATION COMPLEXITY: Low   GOALS: Goals reviewed with patient? No  SHORT TERM GOALS: Target date: 10/14/2022   Patient to demonstrate independence in HEP  Baseline: [id number] Goal status: INITIAL  2.  Decrease pain to 6/10 Baseline: 10/10 Goal status: INITIAL  LONG TERM GOALS: Target date: 11/04/2022    Increase FOTO score to 67 Baseline: 49 Goal status: INITIAL  2.  Increase AROM DF to 10d Baseline:                    A/PROM    L  09/22/22  Knee flexion  [facility]  Knee extension  WFL  Ankle dorsiflexion  -6/0d  Ankle plantarflexion  WFL  Ankle inversion  30d  Ankle eversion  18d   Goal status: INITIAL  3.  Normalize gait pattern by increasing L stance time and push off Baseline: decreased L stance time and push off Goal status: INITIAL  4.  Decrease pain to 4/10 Baseline: 10/10 Goal status: INITIAL  5.  Increase L ankle strength to 4/5 Baseline:                        MMT  L 09/23/22  Ankle dorsiflexion  3+  Ankle plantarflexion  3  Ankle inversion  3+  Ankle eversion  3+   Goal status: INITIAL   PLAN:  PT FREQUENCY: 1-2x/week  PT DURATION: 6 weeks  PLANNED INTERVENTIONS: Therapeutic exercises, Therapeutic activity, Neuromuscular re-education, Balance training, Gait training, Patient/Family education, Self Care, Joint mobilization, Stair training, DME instructions, Dry Needling, Electrical stimulation, Cryotherapy, Moist  heat, Manual therapy, and Re-evaluation  PLAN FOR NEXT SESSION: HEP review and update, manual techniques as appropriate, aerobic tasks, ROM and flexibility activities, strengthening and PREs, TPDN, gait and balance training as needed     Hildred Laser, PT 09/23/2022, 1:24 PM

## 2022-09-22 ENCOUNTER — Telehealth: Payer: Self-pay | Admitting: Family

## 2022-09-22 NOTE — Telephone Encounter (Addendum)
Patient called to check the status of these forms that need to be faxed over to [facility]. Per Vikki Ports, the forms are being worked on now and patient will get a call when they are finished, patient advised of information and patient is upset about this. Patient stated she will be coming to the office today to check on these papers today, 09/22/2022, in person and she said these papers are important, that Ethelene Browns knows this. Also, patient stated that it has been over 2 weeks since she dropped these off.   Patients callback # [phone]

## 2022-09-22 NOTE — Telephone Encounter (Signed)
The front office got this taken care of.

## 2022-09-23 ENCOUNTER — Ambulatory Visit: Payer: BC Managed Care – PPO | Attending: Family

## 2022-09-23 ENCOUNTER — Other Ambulatory Visit: Payer: Self-pay

## 2022-09-23 DIAGNOSIS — W540XXA Bitten by dog, initial encounter: Secondary | ICD-10-CM | POA: Insufficient documentation

## 2022-09-23 DIAGNOSIS — M7662 Achilles tendinitis, left leg: Secondary | ICD-10-CM | POA: Insufficient documentation

## 2022-09-23 DIAGNOSIS — M6281 Muscle weakness (generalized): Secondary | ICD-10-CM | POA: Insufficient documentation

## 2022-09-23 DIAGNOSIS — R2689 Other abnormalities of gait and mobility: Secondary | ICD-10-CM | POA: Insufficient documentation

## 2022-09-23 DIAGNOSIS — M6702 Short Achilles tendon (acquired), left ankle: Secondary | ICD-10-CM | POA: Insufficient documentation

## 2022-09-25 NOTE — Telephone Encounter (Signed)
Pt stated on the form PCP filled out for her she did not add a return-to-work date. Pt asked for a letter to be sent to [employer] with a return-to-work date. She stated she left 4 forms at the front desk mentioned the office has the fax number.   Please call pt to let her know when it's done.

## 2022-09-25 NOTE — Telephone Encounter (Signed)
Call patient with update. Please refer to telephone note from 09/22/2022. Please let me know if I can further assist. Thank you.

## 2022-09-29 NOTE — Telephone Encounter (Signed)
Patient has been notified to keep appointment scheduled with orthopedics and have them to complete the [employer] work clearance form going forward.

## 2022-09-29 NOTE — Telephone Encounter (Signed)
Forms completed and patient sent to ortho with a referral

## 2022-10-02 NOTE — Therapy (Unsigned)
OUTPATIENT PHYSICAL THERAPY LOWER EXTREMITY EVALUATION   Patient Name: Amy Guerrero MRN: [id number] DOB:[birth year], 42 y.o., female Today's Date: 10/05/2022  END OF SESSION:  PT End of Session - 10/05/22 1618     Visit Number 2    Number of Visits 12    Date for PT Re-Evaluation 11/22/22    Authorization Type BCBS    PT Start Time 1617    PT Stop Time 1655    PT Time Calculation (min) 38 min    Activity Tolerance Patient tolerated treatment well    Behavior During Therapy WFL for tasks assessed/performed              Past Medical History:  Diagnosis Date   Asthma    Sinusitis    Past Surgical History:  Procedure Laterality Date   CARPAL TUNNEL RELEASE Right    TUBAL LIGATION     tubes tied     Patient Active Problem List   Diagnosis Date Noted   Lymphedema 08/22/2022   Nerve damage 08/22/2022   Dog bite 08/21/2022   Essential hypertension 05/07/2020   Anxiety and depression 05/07/2020    PCP: Rema Fendt, NP   REFERRING PROVIDER: Adonis Huguenin, NP   REFERRING DIAG: [id number] (ICD-10-CM) - Dog bite, initial encounter M67.02 (ICD-10-CM) - Heel cord tightness, left  THERAPY DIAG:  Left Achilles tendinitis  Muscle weakness (generalized)  Other abnormalities of gait and mobility  Rationale for Evaluation and Treatment: Rehabilitation  ONSET DATE: 07/19/22  SUBJECTIVE:   SUBJECTIVE STATEMENT: Has been partially compliant with HEP and has been trying to stay active.  Baseline pain is 6-7/10, but can spike to 10/10   PERTINENT HISTORY: HPI: The patient is a 42 year old woman who presents today in follow-up after a dog bite to the left lower leg on June 2.  Completed a course of Augmentin initially after the injury went on to have subsequent cellulitis completed a second course of Augmentin as well as doxycycline continue to have swelling and pain difficulty bearing weight due to significant pain.   An MRI was concerning for infectious myositis  did have another hospitalization was seen by ID.  Antibiotics were stopped.   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 Yes: NPRS scale: 10/10 Pain location: L calf Pain description: ache, burn, tingling Aggravating factors: walking, Weight bearing tasks Relieving factors: nothing  PRECAUTIONS: Fall  RED FLAGS: None   WEIGHT BEARING RESTRICTIONS: No  FALLS:  Has patient fallen in last 6 months? Yes. 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 Puncture wound along the anteromedial proximal-mid left lower leg and posterior proximal-mid lower leg with skin thickening concerning for cellulitis. Muscle edema in the soleus muscle, flexor hallucis longus muscle, and flexor digitorum longus muscle concerning for infectious myositis given the puncture wounds. No intramuscular fluid collection to suggest an abscess. 2. No evidence of osteomyelitis of the left tibia and fibula.     Electronically Signed   By: Elige Ko M.D.   On: 08/19/2022 18:04  PATIENT SURVEYS:  FOTO 49(67 predicted)   MUSCLE LENGTH:  POSTURE: No Significant postural limitations  PALPATION: TTP L achilles tendon but not gastroc  LOWER EXTREMITY ROM:  A/PROM Right eval Left eval  Hip flexion    Hip extension    Hip abduction    Hip adduction    Hip internal rotation    Hip external rotation    Knee flexion  WFL  Knee extension  WFL  Ankle dorsiflexion  -6/0d  Ankle plantarflexion  WFL  Ankle inversion  30d  Ankle eversion  18d   (Blank rows = not tested)  LOWER EXTREMITY MMT:  MMT Right eval Left eval   Hip flexion    Hip extension    Hip abduction    Hip adduction    Hip internal rotation    Hip external rotation    Knee flexion    Knee extension    Ankle dorsiflexion  3+  Ankle plantarflexion  3  Ankle inversion  3+  Ankle eversion  3+   (Blank rows = not tested)  LOWER EXTREMITY SPECIAL TESTS:  N/a  FUNCTIONAL TESTS:  5x STS 24s arms crossed  GAIT: Distance walked: 23ft x2 Assistive device utilized: None Level of assistance: Modified independence Comments: decreased L stance time and push off   TODAY'S TREATMENT:     [facility] Adult PT Treatment:                                                DATE: 10/05/22 Therapeutic Exercise: Nustep L2 8 min PF stretch 30s x2 L gastroc stretch 30s x2 L soleus stretch 30s x2 Inv/Ev/DF YTB 15x2 ea.                                                                                                                          DATE: 09/22/22 Eval and HEP    PATIENT EDUCATION:  Education details: Discussed eval findings, rehab rationale and POC and patient is in agreement  Person educated: Patient Education method: Explanation Education comprehension: verbalized understanding and needs further education  HOME EXERCISE PROGRAM: Access Code: [email or web address] URL: [email or web address] Date: 09/23/2022 Prepared by: Gustavus Bryant  Exercises - Long Sitting Plantar Fascia Stretch with Towel  - 2 x daily - 5 x weekly - 1 sets - 2 reps - 30s hold - Heel Toe Raises with Counter Support  - 2 x daily - 5 x weekly - 2 sets - 10 reps - Single Leg Stance with Support  - 2 x daily - 5 x weekly - 1 sets - 2 reps - 10s hold  ASSESSMENT:  CLINICAL IMPRESSION: First f/u session, symptoms essentially unchanged and diffuse.  Focus of today was stretching, strengthening and aerobic work to promote circulation.  Discussed compression hose for edema control   (Eval)Patient is a 42 y.o. female who was seen today for physical therapy evaluation and  treatment for  L ankle and lower leg pain, paresthesias and weakness following a dog bite.  No redness or warmth detected, no marked edema present.  Patient apprehensive about WB on LLE due to pain.  Symptoms localized to achilles tendon primarily but also extend to anterior ankle joint and lateral ankle along peroneal distribution.  Some inconsistencies noted with resisted strength testing.    OBJECTIVE IMPAIRMENTS: Abnormal gait, decreased activity tolerance, decreased balance, decreased knowledge of condition, decreased mobility, difficulty walking, decreased ROM, decreased strength, and pain.   ACTIVITY LIMITATIONS: squatting, stairs, and locomotion level  PERSONAL FACTORS: Age, Fitness, Past/current experiences, and 1 comorbidity: dog bite  are also affecting patient's functional outcome.   REHAB POTENTIAL: Good  CLINICAL DECISION MAKING: Stable/uncomplicated  EVALUATION COMPLEXITY: Low   GOALS: Goals reviewed with patient? No  SHORT TERM GOALS: Target date: 10/14/2022   Patient to demonstrate independence in HEP  Baseline: [email or web address] Goal status: INITIAL  2.  Decrease pain to 6/10 Baseline: 10/10 Goal status: INITIAL  LONG TERM GOALS: Target date: 11/04/2022    Increase FOTO score to 67 Baseline: 49 Goal status: INITIAL  2.  Increase AROM DF to 10d Baseline:                    A/PROM    L  09/22/22  Knee flexion  [facility]  Knee extension  WFL  Ankle dorsiflexion  -6/0d  Ankle plantarflexion  WFL  Ankle inversion  30d  Ankle eversion  18d   Goal status: INITIAL  3.  Normalize gait pattern by increasing L stance time and push off Baseline: decreased L stance time and push off Goal status: INITIAL  4.  Decrease pain to 4/10 Baseline: 10/10 Goal status: INITIAL  5.  Increase L ankle strength to 4/5 Baseline:                        MMT  L 09/23/22  Ankle dorsiflexion  3+  Ankle plantarflexion  3  Ankle inversion  3+  Ankle eversion  3+   Goal status:  INITIAL   PLAN:  PT FREQUENCY: 1-2x/week  PT DURATION: 6 weeks  PLANNED INTERVENTIONS: Therapeutic exercises, Therapeutic activity, Neuromuscular re-education, Balance training, Gait training, Patient/Family education, Self Care, Joint mobilization, Stair training, DME instructions, Dry Needling, Electrical stimulation, Cryotherapy, Moist heat, Manual therapy, and Re-evaluation  PLAN FOR NEXT SESSION: HEP review and update, manual techniques as appropriate, aerobic tasks, ROM and flexibility activities, strengthening and PREs, TPDN, gait and balance training as needed     Hildred Laser, PT 10/05/2022, 4:57 PM

## 2022-10-05 ENCOUNTER — Ambulatory Visit: Payer: BC Managed Care – PPO

## 2022-10-05 DIAGNOSIS — R2689 Other abnormalities of gait and mobility: Secondary | ICD-10-CM

## 2022-10-05 DIAGNOSIS — M7662 Achilles tendinitis, left leg: Secondary | ICD-10-CM | POA: Diagnosis not present

## 2022-10-05 DIAGNOSIS — M6281 Muscle weakness (generalized): Secondary | ICD-10-CM

## 2022-10-08 ENCOUNTER — Ambulatory Visit: Payer: BC Managed Care – PPO

## 2022-10-08 DIAGNOSIS — R2689 Other abnormalities of gait and mobility: Secondary | ICD-10-CM

## 2022-10-08 DIAGNOSIS — M7662 Achilles tendinitis, left leg: Secondary | ICD-10-CM | POA: Diagnosis not present

## 2022-10-08 DIAGNOSIS — M6281 Muscle weakness (generalized): Secondary | ICD-10-CM

## 2022-10-08 NOTE — Therapy (Signed)
OUTPATIENT PHYSICAL THERAPY TREATMENT NOTE   Patient Name: Amy Guerrero MRN: [id number] DOB:04/30/1980, 42 y.o., female Today's Date: 10/08/2022  END OF SESSION:  PT End of Session - 10/08/22 1622     Visit Number 3    Number of Visits 12    Date for PT Re-Evaluation 11/22/22    Authorization Type BCBS    PT Start Time 1623   Pt arrived late   PT Stop Time 1658    PT Time Calculation (min) 35 min    Activity Tolerance Patient tolerated treatment well    Behavior During Therapy [facility] for tasks assessed/performed               Past Medical History:  Diagnosis Date   Asthma    Sinusitis    Past Surgical History:  Procedure Laterality Date   CARPAL TUNNEL RELEASE Right    TUBAL LIGATION     tubes tied     Patient Active Problem List   Diagnosis Date Noted   Lymphedema 08/22/2022   Nerve damage 08/22/2022   Dog bite 08/21/2022   Essential hypertension 05/07/2020   Anxiety and depression 05/07/2020    PCP: Rema Fendt, NP   REFERRING PROVIDER: Adonis Huguenin, NP   REFERRING DIAG: [email or web address] (ICD-10-CM) - Dog bite, initial encounter M67.02 (ICD-10-CM) - Heel cord tightness, left  THERAPY DIAG:  Left Achilles tendinitis  Muscle weakness (generalized)  Other abnormalities of gait and mobility  Rationale for Evaluation and Treatment: Rehabilitation  ONSET DATE: 07/19/22  SUBJECTIVE:   SUBJECTIVE STATEMENT: Patient reports continued pain in her Lt ankle and up her Lt calf.    PERTINENT HISTORY: HPI: The patient is a 42 year old woman who presents today in follow-up after a dog bite to the left lower leg on June 2.  Completed a course of Augmentin initially after the injury went on to have subsequent cellulitis completed a second course of Augmentin as well as doxycycline continue to have swelling and pain difficulty bearing weight due to significant pain.   An MRI was concerning for infectious myositis did have another hospitalization was seen by ID.   Antibiotics were stopped.   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 Yes: NPRS scale: 6-7/10 Pain location: L calf Pain description: ache, burn, tingling Aggravating factors: walking, Weight bearing tasks Relieving factors: nothing  PRECAUTIONS: Fall  RED FLAGS: None   WEIGHT BEARING RESTRICTIONS: No  FALLS:  Has patient fallen in last 6 months? Yes. 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 Puncture wound along the anteromedial proximal-mid left lower leg and posterior proximal-mid lower leg with skin thickening concerning for cellulitis. Muscle edema in the soleus muscle, flexor hallucis longus muscle, and flexor digitorum longus muscle concerning for infectious myositis given the puncture wounds. No intramuscular fluid collection to suggest an abscess. 2. No evidence of osteomyelitis of the left tibia and fibula.     Electronically Signed   By: Elige Ko M.D.   On: 08/19/2022 18:04  PATIENT SURVEYS:  FOTO 49(67 predicted)   MUSCLE LENGTH:   POSTURE: No Significant  postural limitations  PALPATION: TTP L achilles tendon but not gastroc  LOWER EXTREMITY ROM:  A/PROM Right eval Left eval  Hip flexion    Hip extension    Hip abduction    Hip adduction    Hip internal rotation    Hip external rotation    Knee flexion  WFL  Knee extension  WFL  Ankle dorsiflexion  -6/0d  Ankle plantarflexion  WFL  Ankle inversion  30d  Ankle eversion  18d   (Blank rows = not tested)  LOWER EXTREMITY MMT:  MMT Right eval Left eval  Hip flexion    Hip extension    Hip abduction     Hip adduction    Hip internal rotation    Hip external rotation    Knee flexion    Knee extension    Ankle dorsiflexion  3+  Ankle plantarflexion  3  Ankle inversion  3+  Ankle eversion  3+   (Blank rows = not tested)  LOWER EXTREMITY SPECIAL TESTS:  N/a  FUNCTIONAL TESTS:  5x STS 24s arms crossed  GAIT: Distance walked: 74ft x2 Assistive device utilized: None Level of assistance: Modified independence Comments: decreased L stance time and push off   TODAY'S TREATMENT:     [facility] Adult PT Treatment:                                                DATE: 10/08/22 Therapeutic Exercise: Nustep L2 8 min PF stretch 30s x2 L gastroc stretch 30s x2 L soleus stretch 30s x2 Seated rockerboard PF/DF 2x1' Inv/Ev/DF/PF YTB 15x ea   [facility] Adult PT Treatment:                                                DATE: 10/05/22 Therapeutic Exercise: Nustep L2 8 min PF stretch 30s x2 L gastroc stretch 30s x2 L soleus stretch 30s x2 Inv/Ev/DF YTB 15x2 ea.                                                                                                                          DATE: 09/22/22 Eval and HEP    PATIENT EDUCATION:  Education details: Discussed eval findings, rehab rationale and POC and patient is in agreement  Person educated: Patient Education method: Explanation Education comprehension: verbalized understanding and needs further education  HOME EXERCISE PROGRAM: Access Code: [email or web address] URL: [email or web address] Date: 09/23/2022 Prepared by: Gustavus Bryant  Exercises - Long Sitting Plantar Fascia Stretch with Towel  - 2 x daily - 5 x weekly - 1 sets - 2 reps - 30s hold - Heel Toe Raises with Counter Support  - 2 x daily - 5 x weekly - 2 sets -  10 reps - Single Leg Stance with Support  - 2 x daily - 5 x weekly - 1 sets - 2 reps - 10s hold  ASSESSMENT:  CLINICAL IMPRESSION:  Patient presents to PT reporting continued high levels of pain in her Lt ankle and  calf. Session today continued to focus on gentle stretching and strengthening to promote ankle ROM and functional mobility. Patient continues to benefit from skilled PT services and should be progressed as able to improve functional independence.    (Eval)Patient is a 42 y.o. female who was seen today for physical therapy evaluation and treatment for L ankle and lower leg pain, paresthesias and weakness following a dog bite.  No redness or warmth detected, no marked edema present.  Patient apprehensive about WB on LLE due to pain.  Symptoms localized to achilles tendon primarily but also extend to anterior ankle joint and lateral ankle along peroneal distribution.  Some inconsistencies noted with resisted strength testing.    OBJECTIVE IMPAIRMENTS: Abnormal gait, decreased activity tolerance, decreased balance, decreased knowledge of condition, decreased mobility, difficulty walking, decreased ROM, decreased strength, and pain.   ACTIVITY LIMITATIONS: squatting, stairs, and locomotion level  PERSONAL FACTORS: Age, Fitness, Past/current experiences, and 1 comorbidity: dog bite  are also affecting patient's functional outcome.   REHAB POTENTIAL: Good  CLINICAL DECISION MAKING: Stable/uncomplicated  EVALUATION COMPLEXITY: Low   GOALS: Goals reviewed with patient? No  SHORT TERM GOALS: Target date: 10/14/2022   Patient to demonstrate independence in HEP  Baseline: [email or web address] Goal status: INITIAL  2.  Decrease pain to 6/10 Baseline: 10/10 Goal status: INITIAL  LONG TERM GOALS: Target date: 11/04/2022    Increase FOTO score to 67 Baseline: 49 Goal status: INITIAL  2.  Increase AROM DF to 10d Baseline:                    A/PROM    L  09/22/22  Knee flexion  [facility]  Knee extension  WFL  Ankle dorsiflexion  -6/0d  Ankle plantarflexion  WFL  Ankle inversion  30d  Ankle eversion  18d   Goal status: INITIAL  3.  Normalize gait pattern by increasing L stance time and push off Baseline:  decreased L stance time and push off Goal status: INITIAL  4.  Decrease pain to 4/10 Baseline: 10/10 Goal status: INITIAL  5.  Increase L ankle strength to 4/5 Baseline:                        MMT  L 09/23/22  Ankle dorsiflexion  3+  Ankle plantarflexion  3  Ankle inversion  3+  Ankle eversion  3+   Goal status: INITIAL   PLAN:  PT FREQUENCY: 1-2x/week  PT DURATION: 6 weeks  PLANNED INTERVENTIONS: Therapeutic exercises, Therapeutic activity, Neuromuscular re-education, Balance training, Gait training, Patient/Family education, Self Care, Joint mobilization, Stair training, DME instructions, Dry Needling, Electrical stimulation, Cryotherapy, Moist heat, Manual therapy, and Re-evaluation  PLAN FOR NEXT SESSION: HEP review and update, manual techniques as appropriate, aerobic tasks, ROM and flexibility activities, strengthening and PREs, TPDN, gait and balance training as needed     Berta Minor, PTA 10/08/2022, 5:04 PM

## 2022-10-12 ENCOUNTER — Ambulatory Visit: Payer: BC Managed Care – PPO

## 2022-10-12 DIAGNOSIS — R2689 Other abnormalities of gait and mobility: Secondary | ICD-10-CM

## 2022-10-12 DIAGNOSIS — M7662 Achilles tendinitis, left leg: Secondary | ICD-10-CM

## 2022-10-12 DIAGNOSIS — M6281 Muscle weakness (generalized): Secondary | ICD-10-CM

## 2022-10-12 NOTE — Therapy (Signed)
OUTPATIENT PHYSICAL THERAPY TREATMENT NOTE   Patient Name: Amy Guerrero MRN: [id number] DOB:1980-03-26, 42 y.o., female Today's Date: 10/12/2022  END OF SESSION:  PT End of Session - 10/12/22 1657     Visit Number 4    Number of Visits 12    Date for PT Re-Evaluation 11/22/22    Authorization Type BCBS    PT Start Time 1700    PT Stop Time 1740    PT Time Calculation (min) 40 min    Activity Tolerance Patient tolerated treatment well    Behavior During Therapy [facility] for tasks assessed/performed                Past Medical History:  Diagnosis Date   Asthma    Sinusitis    Past Surgical History:  Procedure Laterality Date   CARPAL TUNNEL RELEASE Right    TUBAL LIGATION     tubes tied     Patient Active Problem List   Diagnosis Date Noted   Lymphedema 08/22/2022   Nerve damage 08/22/2022   Dog bite 08/21/2022   Essential hypertension 05/07/2020   Anxiety and depression 05/07/2020    PCP: Amy Fendt, NP   REFERRING PROVIDER: Adonis Huguenin, NP   REFERRING DIAG: [email or web address] (ICD-10-CM) - Dog bite, initial encounter M67.02 (ICD-10-CM) - Heel cord tightness, left  THERAPY DIAG:  Left Achilles tendinitis  Muscle weakness (generalized)  Other abnormalities of gait and mobility  Rationale for Evaluation and Treatment: Rehabilitation  ONSET DATE: 07/19/22  SUBJECTIVE:   SUBJECTIVE STATEMENT: Continues to cite paresthesias edema in L ankle and lateral L foot.  Has not yet obtained compression hose as recommended.   PERTINENT HISTORY: HPI: The patient is a 42 year old woman who presents today in follow-up after a dog bite to the left lower leg on June 2.  Completed a course of Augmentin initially after the injury went on to have subsequent cellulitis completed a second course of Augmentin as well as doxycycline continue to have swelling and pain difficulty bearing weight due to significant pain.   An MRI was concerning for infectious myositis did have  another hospitalization was seen by ID.  Antibiotics were stopped.   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 Yes: NPRS scale: 6-7/10 Pain location: L calf Pain description: ache, burn, tingling Aggravating factors: walking, Weight bearing tasks Relieving factors: nothing  PRECAUTIONS: Fall  RED FLAGS: None   WEIGHT BEARING RESTRICTIONS: No  FALLS:  Has patient fallen in last 6 months? Yes. 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 Puncture wound along the anteromedial proximal-mid left lower leg and posterior proximal-mid lower leg with skin thickening concerning for cellulitis. Muscle edema in the soleus muscle, flexor hallucis longus muscle, and flexor digitorum longus muscle concerning for infectious myositis given the puncture wounds. No intramuscular fluid collection to suggest an abscess. 2. No evidence of osteomyelitis of the left tibia and fibula.     Electronically Signed   By: Elige Ko M.D.   On: 08/19/2022 18:04  PATIENT SURVEYS:  FOTO 49(67 predicted)   MUSCLE LENGTH:  POSTURE: No Significant postural limitations  PALPATION: TTP L achilles tendon but not gastroc  LOWER EXTREMITY ROM:  A/PROM Right eval Left eval  Hip flexion    Hip extension    Hip abduction    Hip adduction    Hip internal rotation    Hip external rotation    Knee flexion  WFL  Knee extension  WFL  Ankle dorsiflexion  -6/0d  Ankle plantarflexion  WFL  Ankle inversion  30d  Ankle eversion  18d   (Blank rows = not tested)  LOWER EXTREMITY MMT:  MMT Right eval Left eval  Hip  flexion    Hip extension    Hip abduction    Hip adduction    Hip internal rotation    Hip external rotation    Knee flexion    Knee extension    Ankle dorsiflexion  3+  Ankle plantarflexion  3  Ankle inversion  3+  Ankle eversion  3+   (Blank rows = not tested)  LOWER EXTREMITY SPECIAL TESTS:  N/a  FUNCTIONAL TESTS:  5x STS 24s arms crossed  GAIT: Distance walked: 29ft x2 Assistive device utilized: None Level of assistance: Modified independence Comments: decreased L stance time and push off   TODAY'S TREATMENT:     [facility] Adult PT Treatment:                                                DATE: 10/12/22 Therapeutic Exercise: Nustep L3 8 min B gastroc stretch 30s x2 slant board B soleus stretch 30s x2 slant board Standing rockerboard PF/DF 2x1' Static RB stance A/P 60s x2 Inv/Ev/DF RTB 15x ea Step ups 4 in 15x Lateral step ups 4in 15x Heel raises with tennis ball squeeze 15x  [facility] Adult PT Treatment:                                                DATE: 10/08/22 Therapeutic Exercise: Nustep L2 8 min PF stretch 30s x2 L gastroc stretch 30s x2 L soleus stretch 30s x2 Seated rockerboard PF/DF 2x1' Inv/Ev/DF/PF YTB 15x ea   [facility] Adult PT Treatment:                                                DATE: 10/05/22 Therapeutic Exercise: Nustep L2 8 min PF stretch 30s x2 L gastroc stretch 30s x2 L soleus stretch 30s x2 Inv/Ev/DF YTB 15x2 ea.  DATE: 09/22/22 Eval and HEP    PATIENT EDUCATION:  Education details: Discussed eval findings, rehab rationale and POC and patient is in agreement  Person educated: Patient Education method: Explanation Education comprehension: verbalized understanding and needs further education  HOME EXERCISE PROGRAM: Access Code: [email or web address] URL: [email or web address] Date: 09/23/2022 Prepared by: Gustavus Bryant  Exercises - Long Sitting Plantar Fascia Stretch with Towel  - 2 x daily - 5 x weekly - 1 sets - 2 reps - 30s hold - Heel Toe Raises with Counter Support  - 2 x daily - 5 x weekly - 2 sets - 10 reps - Single Leg Stance with Support  - 2 x daily - 5 x weekly - 1 sets - 2 reps - 10s hold  ASSESSMENT:  CLINICAL IMPRESSION: Advanced stretching and strengthening as noted.  Incorporated static and dynamic RB tasks alternating b/t static and dynamic.  Increased resistance to RTB for PREs.  Continued c/o L ankle swelling during tasks, discussed need for MD f/u if paresthesias, swelling and pain persist.   (Eval)Patient is a 42 y.o. female who was seen today for physical therapy evaluation and treatment for L ankle and lower leg pain, paresthesias and weakness following a dog bite.  No redness or warmth detected, no marked edema present.  Patient apprehensive about WB on LLE due to pain.  Symptoms localized to achilles tendon primarily but also extend to anterior ankle joint and lateral ankle along peroneal distribution.  Some inconsistencies noted with resisted strength testing.    OBJECTIVE IMPAIRMENTS: Abnormal gait, decreased activity tolerance, decreased balance, decreased knowledge of condition, decreased mobility, difficulty walking, decreased ROM, decreased strength, and pain.   ACTIVITY LIMITATIONS: squatting, stairs, and locomotion level  PERSONAL FACTORS: Age, Fitness, Past/current experiences, and 1 comorbidity: dog bite  are also affecting patient's functional outcome.   REHAB POTENTIAL: Good  CLINICAL DECISION MAKING: Stable/uncomplicated  EVALUATION COMPLEXITY: Low   GOALS: Goals reviewed with patient? No  SHORT TERM GOALS: Target date: 10/14/2022   Patient to demonstrate independence in HEP  Baseline: [email or web address] Goal status: INITIAL  2.  Decrease pain to 6/10 Baseline: 10/10 Goal status: INITIAL  LONG TERM GOALS: Target date: 11/04/2022    Increase FOTO score to  67 Baseline: 49 Goal status: INITIAL  2.  Increase AROM DF to 10d Baseline:                    A/PROM    L  09/22/22  Knee flexion  [facility]  Knee extension  WFL  Ankle dorsiflexion  -6/0d  Ankle plantarflexion  WFL  Ankle inversion  30d  Ankle eversion  18d   Goal status: INITIAL  3.  Normalize gait pattern by increasing L stance time and push off Baseline: decreased L stance time and push off Goal status: INITIAL  4.  Decrease pain to 4/10 Baseline: 10/10 Goal status: INITIAL  5.  Increase L ankle strength to 4/5 Baseline:                        MMT  L 09/23/22  Ankle dorsiflexion  3+  Ankle plantarflexion  3  Ankle inversion  3+  Ankle eversion  3+   Goal status: INITIAL   PLAN:  PT FREQUENCY: 1-2x/week  PT DURATION: 6 weeks  PLANNED INTERVENTIONS: Therapeutic exercises, Therapeutic activity, Neuromuscular re-education, Balance training, Gait training, Patient/Family education, Self Care, Joint mobilization, Stair training, DME instructions, Dry Needling, Electrical stimulation, Cryotherapy, Moist heat,  Manual therapy, and Re-evaluation  PLAN FOR NEXT SESSION: HEP review and update, manual techniques as appropriate, aerobic tasks, ROM and flexibility activities, strengthening and PREs, TPDN, gait and balance training as needed     Hildred Laser, PT 10/12/2022, 5:40 PM

## 2022-10-12 NOTE — Therapy (Signed)
OUTPATIENT PHYSICAL THERAPY TREATMENT NOTE   Patient Name: CHEYNNE MENASCO MRN: [id number] DOB:[birth year], 42 y.o., female Today's Date: 10/12/2022  END OF SESSION:      Past Medical History:  Diagnosis Date   Asthma    Sinusitis    Past Surgical History:  Procedure Laterality Date   CARPAL TUNNEL RELEASE Right    TUBAL LIGATION     tubes tied     Patient Active Problem List   Diagnosis Date Noted   Lymphedema 08/22/2022   Nerve damage 08/22/2022   Dog bite 08/21/2022   Essential hypertension 05/07/2020   Anxiety and depression 05/07/2020    PCP: Rema Fendt, NP   REFERRING PROVIDER: Adonis Huguenin, NP   REFERRING DIAG: [phone] (ICD-10-CM) - Dog bite, initial encounter M67.02 (ICD-10-CM) - Heel cord tightness, left  THERAPY DIAG:  No diagnosis found.  Rationale for Evaluation and Treatment: Rehabilitation  ONSET DATE: 07/19/22  SUBJECTIVE:   SUBJECTIVE STATEMENT: Patient reports continued pain in her Lt ankle and up her Lt calf.    PERTINENT HISTORY: HPI: The patient is a 42 year old woman who presents today in follow-up after a dog bite to the left lower leg on June 2.  Completed a course of Augmentin initially after the injury went on to have subsequent cellulitis completed a second course of Augmentin as well as doxycycline continue to have swelling and pain difficulty bearing weight due to significant pain.   An MRI was concerning for infectious myositis did have another hospitalization was seen by ID.  Antibiotics were stopped.   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 Yes: NPRS scale: 6-7/10 Pain location: L calf Pain description: ache, burn, tingling Aggravating factors: walking, Weight bearing tasks Relieving  factors: nothing  PRECAUTIONS: Fall  RED FLAGS: None   WEIGHT BEARING RESTRICTIONS: No  FALLS:  Has patient fallen in last 6 months? Yes. 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 Puncture wound along the anteromedial proximal-mid left lower leg and posterior proximal-mid lower leg with skin thickening concerning for cellulitis. Muscle edema in the soleus muscle, flexor hallucis longus muscle, and flexor digitorum longus muscle concerning for infectious myositis given the puncture wounds. No intramuscular fluid collection to suggest an abscess. 2. No evidence of osteomyelitis of the left tibia and fibula.     Electronically Signed   By: Elige Ko M.D.   On: 08/19/2022 18:04  PATIENT SURVEYS:  FOTO 49(67 predicted)   MUSCLE LENGTH:   POSTURE: No Significant postural limitations  PALPATION: TTP L achilles tendon but not gastroc  LOWER EXTREMITY ROM:  A/PROM Right eval Left eval  Hip flexion    Hip extension    Hip abduction    Hip adduction    Hip internal rotation    Hip external rotation    Knee flexion  WFL  Knee extension  WFL  Ankle dorsiflexion  -6/0d  Ankle plantarflexion  WFL  Ankle inversion  30d  Ankle eversion  18d   (Blank rows = not tested)  LOWER EXTREMITY MMT:  MMT Right eval Left eval  Hip  flexion    Hip extension    Hip abduction    Hip adduction    Hip internal rotation    Hip external rotation    Knee flexion    Knee extension    Ankle dorsiflexion  3+  Ankle plantarflexion  3  Ankle inversion  3+  Ankle eversion  3+   (Blank rows = not tested)  LOWER EXTREMITY SPECIAL TESTS:  N/a  FUNCTIONAL TESTS:  5x STS 24s arms crossed  GAIT: Distance walked: 30ft x2 Assistive device utilized: None Level of assistance: Modified independence Comments: decreased L  stance time and push off   TODAY'S TREATMENT:     [facility] Adult PT Treatment:                                                DATE: 10/08/22 Therapeutic Exercise: Nustep L2 8 min PF stretch 30s x2 L gastroc stretch 30s x2 L soleus stretch 30s x2 Seated rockerboard PF/DF 2x1' Inv/Ev/DF/PF YTB 15x ea   [facility] Adult PT Treatment:                                                DATE: 10/05/22 Therapeutic Exercise: Nustep L2 8 min PF stretch 30s x2 L gastroc stretch 30s x2 L soleus stretch 30s x2 Inv/Ev/DF YTB 15x2 ea.                                                                                                                          DATE: 09/22/22 Eval and HEP    PATIENT EDUCATION:  Education details: Discussed eval findings, rehab rationale and POC and patient is in agreement  Person educated: Patient Education method: Explanation Education comprehension: verbalized understanding and needs further education  HOME EXERCISE PROGRAM: Access Code: [email or web address] URL: [email or web address] Date: 09/23/2022 Prepared by: Gustavus Bryant  Exercises - Long Sitting Plantar Fascia Stretch with Towel  - 2 x daily - 5 x weekly - 1 sets - 2 reps - 30s hold - Heel Toe Raises with Counter Support  - 2 x daily - 5 x weekly - 2 sets - 10 reps - Single Leg Stance with Support  - 2 x daily - 5 x weekly - 1 sets - 2 reps - 10s hold  ASSESSMENT:  CLINICAL IMPRESSION:  Patient presents to PT reporting continued high levels of pain in her Lt ankle and calf. Session today continued to focus on gentle stretching and strengthening to promote ankle ROM and functional mobility. Patient continues to benefit from skilled PT services and should be progressed as able to improve functional independence.    (Eval)Patient is a 42 y.o. female who was seen today for physical therapy evaluation and  treatment for L ankle and lower leg pain, paresthesias and weakness following a dog bite.  No redness or warmth  detected, no marked edema present.  Patient apprehensive about WB on LLE due to pain.  Symptoms localized to achilles tendon primarily but also extend to anterior ankle joint and lateral ankle along peroneal distribution.  Some inconsistencies noted with resisted strength testing.    OBJECTIVE IMPAIRMENTS: Abnormal gait, decreased activity tolerance, decreased balance, decreased knowledge of condition, decreased mobility, difficulty walking, decreased ROM, decreased strength, and pain.   ACTIVITY LIMITATIONS: squatting, stairs, and locomotion level  PERSONAL FACTORS: Age, Fitness, Past/current experiences, and 1 comorbidity: dog bite  are also affecting patient's functional outcome.   REHAB POTENTIAL: Good  CLINICAL DECISION MAKING: Stable/uncomplicated  EVALUATION COMPLEXITY: Low   GOALS: Goals reviewed with patient? No  SHORT TERM GOALS: Target date: 10/14/2022   Patient to demonstrate independence in HEP  Baseline: [email or web address] Goal status: INITIAL  2.  Decrease pain to 6/10 Baseline: 10/10 Goal status: INITIAL  LONG TERM GOALS: Target date: 11/04/2022    Increase FOTO score to 67 Baseline: 49 Goal status: INITIAL  2.  Increase AROM DF to 10d Baseline:                    A/PROM    L  09/22/22  Knee flexion  [facility]  Knee extension  WFL  Ankle dorsiflexion  -6/0d  Ankle plantarflexion  WFL  Ankle inversion  30d  Ankle eversion  18d   Goal status: INITIAL  3.  Normalize gait pattern by increasing L stance time and push off Baseline: decreased L stance time and push off Goal status: INITIAL  4.  Decrease pain to 4/10 Baseline: 10/10 Goal status: INITIAL  5.  Increase L ankle strength to 4/5 Baseline:                        MMT  L 09/23/22  Ankle dorsiflexion  3+  Ankle plantarflexion  3  Ankle inversion  3+  Ankle eversion  3+   Goal status: INITIAL   PLAN:  PT FREQUENCY: 1-2x/week  PT DURATION: 6 weeks  PLANNED INTERVENTIONS: Therapeutic exercises,  Therapeutic activity, Neuromuscular re-education, Balance training, Gait training, Patient/Family education, Self Care, Joint mobilization, Stair training, DME instructions, Dry Needling, Electrical stimulation, Cryotherapy, Moist heat, Manual therapy, and Re-evaluation  PLAN FOR NEXT SESSION: HEP review and update, manual techniques as appropriate, aerobic tasks, ROM and flexibility activities, strengthening and PREs, TPDN, gait and balance training as needed     Hildred Laser, PT 10/12/2022, 6:10 AM

## 2022-10-14 NOTE — Therapy (Unsigned)
OUTPATIENT PHYSICAL THERAPY TREATMENT NOTE   Patient Name: Amy Guerrero MRN: [id number] DOB:19-Sep-1980, 42 y.o., female Today's Date: 10/15/2022  END OF SESSION:  PT End of Session - 10/15/22 1446     Visit Number 5    Number of Visits 12    Date for PT Re-Evaluation 11/22/22    Authorization Type BC[employer]    PT Start Time 1445    PT Stop Time 1525    PT Time Calculation (min) 40 min    Activity Tolerance Patient tolerated treatment well    Behavior During Therapy [facility] for tasks assessed/performed            Past Medical History:  Diagnosis Date   Asthma    Sinusitis    Past Surgical History:  Procedure Laterality Date   CARPAL TUNNEL RELEASE Right    TUBAL LIGATION     tubes tied     Patient Active Problem List   Diagnosis Date Noted   Lymphedema 08/22/2022   Nerve damage 08/22/2022   Dog bite 08/21/2022   Essential hypertension 05/07/2020   Anxiety and depression 05/07/2020    PCP: Rema Fendt, NP   REFERRING PROVIDER: Adonis Huguenin, NP   REFERRING DIAG: [id number] (ICD-10-CM) - Dog bite, initial encounter M67.02 (ICD-10-CM) - Heel cord tightness, left  THERAPY DIAG:  Left Achilles tendinitis  Muscle weakness (generalized)  Other abnormalities of gait and mobility  Rationale for Evaluation and Treatment: Rehabilitation  ONSET DATE: 07/19/22  SUBJECTIVE:   SUBJECTIVE STATEMENT: Has obtained compression hose and has worn over the past 24 hours.  Continues with diffuse L ankle pain and lateral L ankle/foot paresthesias.  Considering an Orthopedic UC visit tomorrow to discuss ongoing issues.   PERTINENT HISTORY: HPI: The patient is a 41 year old woman who presents today in follow-up after a dog bite to the left lower leg on June 2.  Completed a course of Augmentin initially after the injury went on to have subsequent cellulitis completed a second course of Augmentin as well as doxycycline continue to have swelling and pain difficulty bearing weight  due to significant pain.   An MRI was concerning for infectious myositis did have another hospitalization was seen by ID.  Antibiotics were stopped.   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 Yes: NPRS scale: 6-7/10 Pain location: L calf Pain description: ache, burn, tingling Aggravating factors: walking, Weight bearing tasks Relieving factors: nothing  PRECAUTIONS: Fall  RED FLAGS: None   WEIGHT BEARING RESTRICTIONS: No  FALLS:  Has patient fallen in last 6 months? Yes. 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 Puncture wound along the anteromedial proximal-mid left lower leg and posterior proximal-mid lower leg with skin thickening concerning for cellulitis. Muscle edema in the soleus muscle, flexor hallucis longus muscle, and flexor digitorum longus muscle concerning for infectious myositis given the puncture wounds. No intramuscular fluid collection to suggest an abscess. 2. No evidence of osteomyelitis of the left tibia and fibula.     Electronically Signed   By: Elige Ko M.D.   On: 08/19/2022 18:04  PATIENT  SURVEYS:  FOTO 49(67 predicted)   MUSCLE LENGTH: N/A   POSTURE: No Significant postural limitations  PALPATION: TTP L achilles tendon but not gastroc  LOWER EXTREMITY ROM:  A/PROM Right eval Left eval 10/15/22  Hip flexion     Hip extension     Hip abduction     Hip adduction     Hip internal rotation     Hip external rotation     Knee flexion  WFL   Knee extension  WFL   Ankle dorsiflexion  -6/0d 0/5d  Ankle plantarflexion  WFL WFL  Ankle inversion  30d 30d   Ankle eversion  18d 20   (Blank rows = not tested)  LOWER EXTREMITY MMT:  MMT Right eval Left eval  Hip flexion    Hip extension    Hip abduction    Hip adduction    Hip internal rotation    Hip external rotation    Knee flexion    Knee extension    Ankle dorsiflexion  3+  Ankle plantarflexion  3  Ankle inversion  3+  Ankle eversion  3+   (Blank rows = not tested)  LOWER EXTREMITY SPECIAL TESTS:  N/a  FUNCTIONAL TESTS:  5x STS 24s arms crossed  GAIT: Distance walked: 17ft x2 Assistive device utilized: None Level of assistance: Modified independence Comments: decreased L stance time and push off   TODAY'S TREATMENT:     [facility] Adult PT Treatment:                                                DATE: 10/15/22 Therapeutic Exercise: Nustep L4 8 min B gastroc stretch 30s x2 slant board B soleus stretch 30s x2 slant board Standing rockerboard PF/DF 2x1' Standing RB WS M/L 2 x1 Runners step 6 in 15x B Heel raises with tennis ball squeeze 15x2  [facility] Adult PT Treatment:                                                DATE: 10/12/22 Therapeutic Exercise: Nustep L3 8 min B gastroc stretch 30s x2 slant board B soleus stretch 30s x2 slant board Standing rockerboard PF/DF 2x1' Static RB stance A/P 60s x2 Inv/Ev/DF RTB 15x ea Step ups 4 in 15x Lateral step ups 4in 15x Heel raises with tennis ball squeeze 15x  [facility] Adult PT Treatment:                                                DATE: 10/08/22 Therapeutic Exercise: Nustep L2 8 min PF stretch 30s x2 L gastroc stretch 30s x2 L soleus stretch 30s x2 Seated rockerboard PF/DF 2x1' Inv/Ev/DF/PF YTB 15x ea   [facility] Adult PT Treatment:                                                DATE: 10/05/22 Therapeutic Exercise: Nustep L2 8 min PF stretch 30s x2 L gastroc stretch 30s x2 L soleus  stretch 30s x2 Inv/Ev/DF YTB 15x2 ea.                                                                                                                           DATE: 09/22/22 Eval and HEP    PATIENT EDUCATION:  Education details: Discussed eval findings, rehab rationale and POC and patient is in agreement  Person educated: Patient Education method: Explanation Education comprehension: verbalized understanding and needs further education  HOME EXERCISE PROGRAM: Access Code: [email or web address] URL: [email or web address] Date: 09/23/2022 Prepared by: Gustavus Bryant  Exercises - Long Sitting Plantar Fascia Stretch with Towel  - 2 x daily - 5 x weekly - 1 sets - 2 reps - 30s hold - Heel Toe Raises with Counter Support  - 2 x daily - 5 x weekly - 2 sets - 10 reps - Single Leg Stance with Support  - 2 x daily - 5 x weekly - 1 sets - 2 reps - 10s hold  ASSESSMENT:  CLINICAL IMPRESSION: Arrives with compression hose which offer support and stability.  Still dealing with paresthesias and pain.  ROM updated and DF has improved.  AROM DF at neutral but equal to contralateral side when observed during functional tasks in clinic.  Focus of today was increasing strength, proprioception and confidence in L ankle.  Able to perform all tasks w/o setback or symptom exacerbation.   (Eval)Patient is a 42 y.o. female who was seen today for physical therapy evaluation and treatment for L ankle and lower leg pain, paresthesias and weakness following a dog bite.  No redness or warmth detected, no marked edema present.  Patient apprehensive about WB on LLE due to pain.  Symptoms localized to achilles tendon primarily but also extend to anterior ankle joint and lateral ankle along peroneal distribution.  Some inconsistencies noted with resisted strength testing.    OBJECTIVE IMPAIRMENTS: Abnormal gait, decreased activity tolerance, decreased balance, decreased knowledge of condition, decreased mobility, difficulty walking, decreased ROM, decreased strength, and pain.   ACTIVITY LIMITATIONS: squatting, stairs, and locomotion level  PERSONAL  FACTORS: Age, Fitness, Past/current experiences, and 1 comorbidity: dog bite  are also affecting patient's functional outcome.   REHAB POTENTIAL: Good  CLINICAL DECISION MAKING: Stable/uncomplicated  EVALUATION COMPLEXITY: Low   GOALS: Goals reviewed with patient? No  SHORT TERM GOALS: Target date: 10/14/2022   Patient to demonstrate independence in HEP  Baseline: [email or web address] Goal status: INITIAL  2.  Decrease pain to 6/10 Baseline: 10/10 Goal status: INITIAL  LONG TERM GOALS: Target date: 11/04/2022    Increase FOTO score to 67 Baseline: 49 Goal status: INITIAL  2.  Increase AROM DF to 10d Baseline:                    A/PROM    L  09/22/22  Knee flexion  [facility]  Knee extension  [facility]  Ankle dorsiflexion  -6/0d  Ankle plantarflexion  WFL  Ankle inversion  30d  Ankle eversion  18d   Goal status: INITIAL  3.  Normalize gait pattern by increasing L stance time and push off Baseline: decreased L stance time and push off Goal status: INITIAL  4.  Decrease pain to 4/10 Baseline: 10/10 Goal status: INITIAL  5.  Increase L ankle strength to 4/5 Baseline:                        MMT  L 09/23/22  Ankle dorsiflexion  3+  Ankle plantarflexion  3  Ankle inversion  3+  Ankle eversion  3+   Goal status: INITIAL   PLAN:  PT FREQUENCY: 1-2x/week  PT DURATION: 6 weeks  PLANNED INTERVENTIONS: Therapeutic exercises, Therapeutic activity, Neuromuscular re-education, Balance training, Gait training, Patient/Family education, Self Care, Joint mobilization, Stair training, DME instructions, Dry Needling, Electrical stimulation, Cryotherapy, Moist heat, Manual therapy, and Re-evaluation  PLAN FOR NEXT SESSION: HEP review and update, manual techniques as appropriate, aerobic tasks, ROM and flexibility activities, strengthening and PREs, TPDN, gait and balance training as needed     Hildred Laser, PT 10/15/2022, 3:37 PM

## 2022-10-15 ENCOUNTER — Ambulatory Visit: Payer: BC Managed Care – PPO

## 2022-10-15 DIAGNOSIS — M6281 Muscle weakness (generalized): Secondary | ICD-10-CM

## 2022-10-15 DIAGNOSIS — R2689 Other abnormalities of gait and mobility: Secondary | ICD-10-CM

## 2022-10-15 DIAGNOSIS — M7662 Achilles tendinitis, left leg: Secondary | ICD-10-CM | POA: Diagnosis not present

## 2022-10-16 ENCOUNTER — Encounter (HOSPITAL_BASED_OUTPATIENT_CLINIC_OR_DEPARTMENT_OTHER): Payer: Self-pay | Admitting: Student

## 2022-10-16 ENCOUNTER — Ambulatory Visit (HOSPITAL_BASED_OUTPATIENT_CLINIC_OR_DEPARTMENT_OTHER): Payer: BC Managed Care – PPO

## 2022-10-16 ENCOUNTER — Other Ambulatory Visit (HOSPITAL_BASED_OUTPATIENT_CLINIC_OR_DEPARTMENT_OTHER): Payer: Self-pay

## 2022-10-16 ENCOUNTER — Ambulatory Visit (HOSPITAL_BASED_OUTPATIENT_CLINIC_OR_DEPARTMENT_OTHER): Payer: BC Managed Care – PPO | Admitting: Student

## 2022-10-16 DIAGNOSIS — M25572 Pain in left ankle and joints of left foot: Secondary | ICD-10-CM | POA: Diagnosis not present

## 2022-10-16 DIAGNOSIS — W540XXA Bitten by dog, initial encounter: Secondary | ICD-10-CM

## 2022-10-16 MED ORDER — MELOXICAM 15 MG PO TABS
15.0000 mg | ORAL_TABLET | Freq: Every day | ORAL | 0 refills | Status: AC
  Filled 2022-10-16: qty 10, 10d supply, fill #0

## 2022-10-17 NOTE — Progress Notes (Signed)
Chief Complaint: Left ankle pain     History of Present Illness:    Amy Guerrero is a 42 y.o. female presenting today for evaluation of continued pain and swelling of the left ankle after sustaining a dog bite 3 months ago.  Her history is significant for cellulitis infections due to the wounds with multiple courses of antibiotics.  She has been working with physical therapy for strengthening and range of motion.  Today she reports continued swelling, pain that occasionally shoots up the leg, as well as paresthesias over the lateral foot.  She has been taking ibuprofen as well as gabapentin 300 mg 3 times daily without significant relief.  No worsening redness, fever, or chills.   Surgical History:   None  PMH/PSH/Family History/Social History/Meds/Allergies:    Past Medical History:  Diagnosis Date   Asthma    Sinusitis    Past Surgical History:  Procedure Laterality Date   CARPAL TUNNEL RELEASE Right    TUBAL LIGATION     tubes tied     Social History   Socioeconomic History   Marital status: Single    Spouse name: Not on file   Number of children: Not on file   Years of education: Not on file   Highest education level: Not on file  Occupational History   Not on file  Tobacco Use   Smoking status: Never   Smokeless tobacco: Never  Vaping Use   Vaping status: Former  Substance and Sexual Activity   Alcohol use: Yes    Alcohol/week: 1.0 standard drink of alcohol    Types: 1 Glasses of wine per week    Comment: occassionally   Drug use: Yes    Types: Marijuana   Sexual activity: Not on file  Other Topics Concern   Not on file  Social History Narrative   Not on file   Social Determinants of Health   Financial Resource Strain: Medium Risk (08/10/2022)   Overall Financial Resource Strain (CARDIA)    Difficulty of Paying Living Expenses: Somewhat hard  Food Insecurity: No Food Insecurity (08/22/2022)   Hunger Vital Sign     Worried About Running Out of Food in the Last Year: Never true    Ran Out of Food in the Last Year: Never true  Transportation Needs: No Transportation Needs (08/22/2022)   PRAPARE - Administrator, Civil Service (Medical): No    Lack of Transportation (Non-Medical): No  Physical Activity: Unknown (08/10/2022)   Exercise Vital Sign    Days of Exercise per Week: 0 days    Minutes of Exercise per Session: Not on file  Stress: Stress Concern Present (08/10/2022)   [employer] of Occupational Health - Occupational Stress Questionnaire    Feeling of Stress : Very much  Social Connections: Socially Isolated (08/10/2022)   Social Connection and Isolation Panel [NHANES]    Frequency of Communication with Friends and Family: More than three times a week    Frequency of Social Gatherings with Friends and Family: More than three times a week    Attends Religious Services: Never    Database administrator or Organizations: No    Attends Engineer, structural: Not on file    Marital Status: Never married   Family History  Problem Relation Age of  Onset   Diabetes Mother    Diabetes Father    Allergies  Allergen Reactions   Vancomycin Itching and Rash   Current Outpatient Medications  Medication Sig Dispense Refill   meloxicam (MOBIC) 15 MG tablet Take 1 tablet (15 mg total) by mouth daily for 10 days. 10 tablet 0   albuterol (PROVENTIL) (2.5 MG/3ML) 0.083% nebulizer solution Take 3 mLs (2.5 mg total) by nebulization every 6 (six) hours as needed for wheezing or shortness of breath. 150 mL 2   albuterol (VENTOLIN HFA) 108 (90 Base) MCG/ACT inhaler Inhale 2 puffs into the lungs every 6 (six) hours as needed for wheezing or shortness of breath. 8 g 2   Cetirizine HCl 10 MG CAPS Take 1 capsule (10 mg total) by mouth daily. 90 capsule 0   cyclobenzaprine (FLEXERIL) 5 MG tablet Take 1 tablet (5 mg total) by mouth 2 (two) times daily as needed for muscle spasms. (Patient not  taking: Reported on 07/31/2022) 20 tablet 0   ferrous gluconate (FERGON) 324 MG tablet Take 1 tablet (324 mg total) by mouth daily with breakfast. 30 tablet 3   gabapentin (NEURONTIN) 300 MG capsule Take 1 capsule (300 mg total) by mouth 3 (three) times daily. 90 capsule 0   ondansetron (ZOFRAN-ODT) 8 MG disintegrating tablet Take 1 tablet (8 mg total) by mouth every 8 (eight) hours as needed for nausea. (Patient not taking: Reported on 07/31/2022) 10 tablet 0   oxyCODONE (OXY IR/ROXICODONE) 5 MG immediate release tablet Take 1 tablet (5 mg total) by mouth every 6 (six) hours as needed for moderate pain. 15 tablet 0   Spacer/Aero-Holding Chambers (AEROCHAMBER PLUS) inhaler Use as instructed (Patient not taking: Reported on 07/31/2022) 1 each 2   No current facility-administered medications for this visit.   No results found.  Review of Systems:   A ROS was performed including pertinent positives and negatives as documented in the HPI.  Physical Exam :   Constitutional: NAD and appears stated age Neurological: Alert and oriented Psych: Appropriate affect and cooperative There were no vitals taken for this visit.   Comprehensive Musculoskeletal Exam:    Patient wearing bilateral compression stockings.  Bite wounds of left lower extremity are well-healed.  Active range of motion of the left ankle to 10 degrees dorsiflexion and 30 responder flexion.  Tenderness noted over the lateral ankle and over the Achilles distribution.  Decreased sensation noted over the inferior lateral ankle and foot.  DP pulse 2+.  Imaging:   Xray (left ankle 3 views): Negative for acute bony abnormality.  Mild lateral soft tissue edema.   I personally reviewed and interpreted the radiographs.   Assessment:   42 y.o. female with continued left ankle pain, swelling, and nerve symptoms.  She does have notable tightness in the Achilles, however this has been improving per recent PT notes notably she will continue to  benefit ROM with therapy.  I was unable to see if any prior ankle radiographs have been taken, but these did not reveal any underlying abnormalities today.  I will plan to start meloxicam to aid with her pain and inflammation.  I would like her to follow-up soon with Barnie Del, NP for repeat evaluation.  Plan :    -Start meloxicam 15 mg for 10 days -Follow-up with Barnie Del for further evaluation    I personally saw and evaluated the patient, and participated in the management and treatment plan.  Hazle Nordmann, PA-C Orthopedics

## 2022-10-20 ENCOUNTER — Ambulatory Visit: Payer: BC Managed Care – PPO | Attending: Family

## 2022-10-20 DIAGNOSIS — R2689 Other abnormalities of gait and mobility: Secondary | ICD-10-CM | POA: Diagnosis present

## 2022-10-20 DIAGNOSIS — M7662 Achilles tendinitis, left leg: Secondary | ICD-10-CM | POA: Insufficient documentation

## 2022-10-20 DIAGNOSIS — M6281 Muscle weakness (generalized): Secondary | ICD-10-CM | POA: Insufficient documentation

## 2022-10-20 NOTE — Therapy (Signed)
OUTPATIENT PHYSICAL THERAPY TREATMENT NOTE   Patient Name: Amy Guerrero MRN: [id number] DOB:[birth year], 42 y.o., female Today's Date: 10/20/2022  END OF SESSION:  PT End of Session - 10/20/22 1353     Visit Number 6    Number of Visits 12    Date for PT Re-Evaluation 11/22/22    Authorization Type BCBS    PT Start Time 1400    PT Stop Time 1440    PT Time Calculation (min) 40 min    Activity Tolerance Patient tolerated treatment well    Behavior During Therapy [facility] for tasks assessed/performed             Past Medical History:  Diagnosis Date   Asthma    Sinusitis    Past Surgical History:  Procedure Laterality Date   CARPAL TUNNEL RELEASE Right    TUBAL LIGATION     tubes tied     Patient Active Problem List   Diagnosis Date Noted   Lymphedema 08/22/2022   Nerve damage 08/22/2022   Dog bite 08/21/2022   Essential hypertension 05/07/2020   Anxiety and depression 05/07/2020    PCP: Rema Fendt, NP   REFERRING PROVIDER: Adonis Huguenin, NP   REFERRING DIAG: [email or web address] (ICD-10-CM) - Dog bite, initial encounter M67.02 (ICD-10-CM) - Heel cord tightness, left  THERAPY DIAG:  Left Achilles tendinitis  Muscle weakness (generalized)  Other abnormalities of gait and mobility  Rationale for Evaluation and Treatment: Rehabilitation  ONSET DATE: 07/19/22  SUBJECTIVE:   SUBJECTIVE STATEMENT: Compression hose helpful.  Saw ortho UC MD, no change in POC recommended.  Issued meloxicam for swelling and paresthesias.   PERTINENT HISTORY: HPI: The patient is a 42 year old woman who presents today in follow-up after a dog bite to the left lower leg on June 2.  Completed a course of Augmentin initially after the injury went on to have subsequent cellulitis completed a second course of Augmentin as well as doxycycline continue to have swelling and pain difficulty bearing weight due to significant pain.   An MRI was concerning for infectious myositis did have another  hospitalization was seen by ID.  Antibiotics were stopped.   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 Yes: NPRS scale: 6-7/10 Pain location: L calf Pain description: ache, burn, tingling Aggravating factors: walking, Weight bearing tasks Relieving factors: nothing  PRECAUTIONS: Fall  RED FLAGS: None   WEIGHT BEARING RESTRICTIONS: No  FALLS:  Has patient fallen in last 6 months? Yes. 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 Puncture wound along the anteromedial proximal-mid left lower leg and posterior proximal-mid lower leg with skin thickening concerning for cellulitis. Muscle edema in the soleus muscle, flexor hallucis longus muscle, and flexor digitorum longus muscle concerning for infectious myositis given the puncture wounds. No intramuscular fluid collection to suggest an abscess. 2. No evidence of osteomyelitis of the left tibia and fibula.     Electronically Signed   By: Elige Ko M.D.   On: 08/19/2022 18:04  PATIENT SURVEYS:  FOTO 49(67 predicted) : 10/20/22 64   MUSCLE LENGTH: N/A  POSTURE: No Significant postural limitations  PALPATION: TTP L achilles tendon but not gastroc  LOWER EXTREMITY ROM:  A/PROM Right eval Left eval 10/15/22  Hip flexion     Hip extension     Hip abduction     Hip adduction     Hip internal rotation     Hip external rotation     Knee flexion  WFL   Knee extension  WFL   Ankle dorsiflexion  -6/0d 0/5d  Ankle plantarflexion  [facility] WFL  Ankle inversion  30d 30d  Ankle eversion  18d 20   (Blank rows = not tested)  LOWER EXTREMITY  MMT:  MMT Right eval Left eval  Hip flexion    Hip extension    Hip abduction    Hip adduction    Hip internal rotation    Hip external rotation    Knee flexion    Knee extension    Ankle dorsiflexion  3+  Ankle plantarflexion  3  Ankle inversion  3+  Ankle eversion  3+   (Blank rows = not tested)  LOWER EXTREMITY SPECIAL TESTS:  N/a  FUNCTIONAL TESTS:  5x STS 24s arms crossed  GAIT: Distance walked: 90ft x2 Assistive device utilized: None Level of assistance: Modified independence Comments: decreased L stance time and push off   TODAY'S TREATMENT:     [facility] Adult PT Treatment:                                                DATE: 10/20/22 Therapeutic Exercise: Nustep L5 8 min B gastroc stretch 30s x2 slant board B soleus stretch 30s x2 slant board Standing rockerboard PF/DF 2x1' Standing RB WS M/L 2 x1 Runners step 6 in 15x B 5# KB Heel raises with tennis ball squeeze 15x2  [facility] Adult PT Treatment:                                                DATE: 10/15/22 Therapeutic Exercise: Nustep L4 8 min B gastroc stretch 30s x2 slant board B soleus stretch 30s x2 slant board Standing rockerboard PF/DF 2x1' Standing RB WS M/L 2 x1 Runners step 6 in 15x B Heel raises with tennis ball squeeze 15x2  [facility] Adult PT Treatment:                                                DATE: 10/12/22 Therapeutic Exercise: Nustep L3 8 min B gastroc stretch 30s x2 slant board B soleus stretch 30s x2 slant board Standing rockerboard PF/DF 2x1' Static RB stance A/P 60s x2 Inv/Ev/DF RTB 15x ea Step ups 4 in 15x Lateral step ups 4in 15x Heel raises with tennis ball squeeze 15x  [facility] Adult PT Treatment:                                                DATE: 10/08/22 Therapeutic Exercise: Nustep L2 8 min PF stretch 30s x2 L gastroc stretch  30s x2 L soleus stretch 30s x2 Seated rockerboard PF/DF 2x1' Inv/Ev/DF/PF YTB 15x ea   [facility] Adult PT Treatment:                                                 DATE: 10/05/22 Therapeutic Exercise: Nustep L2 8 min PF stretch 30s x2 L gastroc stretch 30s x2 L soleus stretch 30s x2 Inv/Ev/DF YTB 15x2 ea.                                                                                                                          DATE: 09/22/22 Eval and HEP    PATIENT EDUCATION:  Education details: Discussed eval findings, rehab rationale and POC and patient is in agreement  Person educated: Patient Education method: Explanation Education comprehension: verbalized understanding and needs further education  HOME EXERCISE PROGRAM: Access Code: [email or web address] URL: [email or web address] Date: 09/23/2022 Prepared by: Gustavus Bryant  Exercises - Long Sitting Plantar Fascia Stretch with Towel  - 2 x daily - 5 x weekly - 1 sets - 2 reps - 30s hold - Heel Toe Raises with Counter Support  - 2 x daily - 5 x weekly - 2 sets - 10 reps - Single Leg Stance with Support  - 2 x daily - 5 x weekly - 1 sets - 2 reps - 10s hold  ASSESSMENT:  CLINICAL IMPRESSION: Continued to focus on ROM, strength and functional training of L ankle.  Increased challenge of exercises as noted.  FOTO score increased to 64.   (Eval)Patient is a 42 y.o. female who was seen today for physical therapy evaluation and treatment for L ankle and lower leg pain, paresthesias and weakness following a dog bite.  No redness or warmth detected, no marked edema present.  Patient apprehensive about WB on LLE due to pain.  Symptoms localized to achilles tendon primarily but also extend to anterior ankle joint and lateral ankle along peroneal distribution.  Some inconsistencies noted with resisted strength testing.    OBJECTIVE IMPAIRMENTS: Abnormal gait, decreased activity tolerance, decreased balance, decreased knowledge of condition, decreased mobility, difficulty walking, decreased ROM, decreased strength, and pain.   ACTIVITY LIMITATIONS: squatting, stairs, and locomotion  level  PERSONAL FACTORS: Age, Fitness, Past/current experiences, and 1 comorbidity: dog bite  are also affecting patient's functional outcome.   REHAB POTENTIAL: Good  CLINICAL DECISION MAKING: Stable/uncomplicated  EVALUATION COMPLEXITY: Low   GOALS: Goals reviewed with patient? No  SHORT TERM GOALS: Target date: 10/14/2022   Patient to demonstrate independence in HEP  Baseline: [email or web address] Goal status: Met  2.  Decrease pain to 6/10 Baseline: 10/10; 10/20/22 5/10 Goal status: INITIAL  LONG TERM GOALS: Target date: 11/04/2022    Increase FOTO score to 67 Baseline: 49; 10/20/22 64 Goal status: Ongoing  2.  Increase AROM DF to 10d Baseline:  A/PROM    L  09/22/22  Knee flexion  WFL  Knee extension  WFL  Ankle dorsiflexion  -6/0d  Ankle plantarflexion  WFL  Ankle inversion  30d  Ankle eversion  18d   Goal status: INITIAL  3.  Normalize gait pattern by increasing L stance time and push off Baseline: decreased L stance time and push off Goal status: INITIAL  4.  Decrease pain to 4/10 Baseline: 10/10 Goal status: INITIAL  5.  Increase L ankle strength to 4/5 Baseline:                        MMT  L 09/23/22  Ankle dorsiflexion  3+  Ankle plantarflexion  3  Ankle inversion  3+  Ankle eversion  3+   Goal status: INITIAL   PLAN:  PT FREQUENCY: 1-2x/week  PT DURATION: 6 weeks  PLANNED INTERVENTIONS: Therapeutic exercises, Therapeutic activity, Neuromuscular re-education, Balance training, Gait training, Patient/Family education, Self Care, Joint mobilization, Stair training, DME instructions, Dry Needling, Electrical stimulation, Cryotherapy, Moist heat, Manual therapy, and Re-evaluation  PLAN FOR NEXT SESSION: HEP review and update, manual techniques as appropriate, aerobic tasks, ROM and flexibility activities, strengthening and PREs, TPDN, gait and balance training as needed     Hildred Laser, PT 10/20/2022, 2:37 PM

## 2022-10-22 NOTE — Telephone Encounter (Signed)
See routing comment(s) for message information.

## 2022-10-23 ENCOUNTER — Ambulatory Visit: Payer: BC Managed Care – PPO

## 2022-10-23 DIAGNOSIS — M6281 Muscle weakness (generalized): Secondary | ICD-10-CM

## 2022-10-23 DIAGNOSIS — R2689 Other abnormalities of gait and mobility: Secondary | ICD-10-CM

## 2022-10-23 DIAGNOSIS — M7662 Achilles tendinitis, left leg: Secondary | ICD-10-CM | POA: Diagnosis not present

## 2022-10-23 NOTE — Therapy (Signed)
OUTPATIENT PHYSICAL THERAPY TREATMENT NOTE   Patient Name: Amy Guerrero MRN: [id number] DOB:1980-12-13, 42 y.o., female Today's Date: 10/23/2022  END OF SESSION:  PT End of Session - 10/23/22 1059     Visit Number 7    Number of Visits 12    Date for PT Re-Evaluation 11/22/22    Authorization Type BCBS    PT Start Time 1100    PT Stop Time 1140    PT Time Calculation (min) 40 min    Activity Tolerance Patient tolerated treatment well    Behavior During Therapy WFL for tasks assessed/performed              Past Medical History:  Diagnosis Date   Asthma    Sinusitis    Past Surgical History:  Procedure Laterality Date   CARPAL TUNNEL RELEASE Right    TUBAL LIGATION     tubes tied     Patient Active Problem List   Diagnosis Date Noted   Lymphedema 08/22/2022   Nerve damage 08/22/2022   Dog bite 08/21/2022   Essential hypertension 05/07/2020   Anxiety and depression 05/07/2020    PCP: Rema Fendt, NP   REFERRING PROVIDER: Adonis Huguenin, NP   REFERRING DIAG: [email or web address] (ICD-10-CM) - Dog bite, initial encounter M67.02 (ICD-10-CM) - Heel cord tightness, left  THERAPY DIAG:  Left Achilles tendinitis  Muscle weakness (generalized)  Other abnormalities of gait and mobility  Rationale for Evaluation and Treatment: Rehabilitation  ONSET DATE: 07/19/22  SUBJECTIVE:   SUBJECTIVE STATEMENT:  Pt presents to PT with continued reports of slight L ankle pain. Has been compliant with HEP.    PERTINENT HISTORY: HPI: The patient is a 42 year old woman who presents today in follow-up after a dog bite to the left lower leg on June 2.  Completed a course of Augmentin initially after the injury went on to have subsequent cellulitis completed a second course of Augmentin as well as doxycycline continue to have swelling and pain difficulty bearing weight due to significant pain.   An MRI was concerning for infectious myositis did have another hospitalization was seen  by ID.  Antibiotics were stopped.   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 Yes: NPRS scale: 6-7/10 Pain location: L calf Pain description: ache, burn, tingling Aggravating factors: walking, Weight bearing tasks Relieving factors: nothing  PRECAUTIONS: Fall  RED FLAGS: None   WEIGHT BEARING RESTRICTIONS: No  FALLS:  Has patient fallen in last 6 months? Yes. 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 Puncture wound along the anteromedial proximal-mid left lower leg and posterior proximal-mid lower leg with skin thickening concerning for cellulitis. Muscle edema in the soleus muscle, flexor hallucis longus muscle, and flexor digitorum longus muscle concerning for infectious myositis given the puncture wounds. No intramuscular fluid collection to suggest an abscess. 2. No evidence of osteomyelitis of the left tibia and fibula.     Electronically Signed   By: Elige Ko M.D.   On: 08/19/2022 18:04  PATIENT SURVEYS:  FOTO 49(67 predicted) : 10/20/22 64   MUSCLE LENGTH: N/A  POSTURE: No Significant postural limitations  PALPATION: TTP L achilles tendon but not gastroc  LOWER EXTREMITY ROM:  A/PROM Right eval Left eval 10/15/22  Hip flexion     Hip extension     Hip abduction     Hip adduction     Hip internal rotation     Hip external rotation     Knee flexion  WFL   Knee extension  WFL   Ankle dorsiflexion  -6/0d 0/5d  Ankle plantarflexion  [facility] WFL  Ankle inversion  30d 30d  Ankle eversion  18d 20   (Blank rows = not tested)  LOWER EXTREMITY MMT:  MMT Right eval  Left eval  Hip flexion    Hip extension    Hip abduction    Hip adduction    Hip internal rotation    Hip external rotation    Knee flexion    Knee extension    Ankle dorsiflexion  3+  Ankle plantarflexion  3  Ankle inversion  3+  Ankle eversion  3+   (Blank rows = not tested)  LOWER EXTREMITY SPECIAL TESTS:  N/a  FUNCTIONAL TESTS:  5x STS 24s arms crossed  GAIT: Distance walked: 51ft x2 Assistive device utilized: None Level of assistance: Modified independence Comments: decreased L stance time and push off   TODAY'S TREATMENT:     [facility] Adult PT Treatment:                                                DATE: 10/23/22 Therapeutic Exercise: Nustep L5 x 6 min while taking subjective B gastroc stretch 30s x2 slant board B soleus stretch 30s x2 slant board Standing rockerboard PF/DF 2x60" Standing RB WS M/L 2x60" Tandem stance 2x30" each Heel raises with tennis ball squeeze 3x15 Eccentric heel raise/lower 2x10 2in L ankle DF/inv/ev 2x10 RTB Seated heel raise 25# KB 2x15 L  [facility] Adult PT Treatment:                                                DATE: 10/20/22 Therapeutic Exercise: Nustep L5 8 min B gastroc stretch 30s x2 slant board B soleus stretch 30s x2 slant board Standing rockerboard PF/DF 2x1' Standing RB WS M/L 2 x1 Runners step 6 in 15x B 5# KB Heel raises with tennis ball squeeze 15x2  [facility] Adult PT Treatment:                                                DATE: 10/15/22 Therapeutic Exercise: Nustep L4 8 min B gastroc stretch 30s x2 slant board B soleus stretch 30s x2 slant board Standing rockerboard PF/DF 2x1' Standing RB WS M/L 2 x1 Runners step 6 in 15x B Heel raises with tennis ball squeeze 15x2  PATIENT EDUCATION:  Education details: Discussed eval findings, rehab rationale and POC and patient is in agreement  Person educated: Patient Education method: Explanation Education comprehension: verbalized understanding and needs further  education  HOME EXERCISE PROGRAM: Access Code: [email or web address] URL: [email or web address] Date: 09/23/2022 Prepared by: Gustavus Bryant  Exercises - Long Sitting Plantar Fascia Stretch  with Towel  - 2 x daily - 5 x weekly - 1 sets - 2 reps - 30s hold - Heel Toe Raises with Counter Support  - 2 x daily - 5 x weekly - 2 sets - 10 reps - Single Leg Stance with Support  - 2 x daily - 5 x weekly - 1 sets - 2 reps - 10s hold  ASSESSMENT:  CLINICAL IMPRESSION:  Pt was able to complete all prescribed exercises with no adverse effect. Therapy today focused on ankle strength and proprioception. Continues to progress well with therapy, will continue per POC as prescribed.   (Eval)Patient is a 42 y.o. female who was seen today for physical therapy evaluation and treatment for L ankle and lower leg pain, paresthesias and weakness following a dog bite.  No redness or warmth detected, no marked edema present.  Patient apprehensive about WB on LLE due to pain.  Symptoms localized to achilles tendon primarily but also extend to anterior ankle joint and lateral ankle along peroneal distribution.  Some inconsistencies noted with resisted strength testing.    OBJECTIVE IMPAIRMENTS: Abnormal gait, decreased activity tolerance, decreased balance, decreased knowledge of condition, decreased mobility, difficulty walking, decreased ROM, decreased strength, and pain.   ACTIVITY LIMITATIONS: squatting, stairs, and locomotion level  PERSONAL FACTORS: Age, Fitness, Past/current experiences, and 1 comorbidity: dog bite  are also affecting patient's functional outcome.   REHAB POTENTIAL: Good  CLINICAL DECISION MAKING: Stable/uncomplicated  EVALUATION COMPLEXITY: Low   GOALS: Goals reviewed with patient? No  SHORT TERM GOALS: Target date: 10/14/2022   Patient to demonstrate independence in HEP  Baseline: [email or web address] Goal status: Met  2.  Decrease pain to 6/10 Baseline: 10/10; 10/20/22 5/10 Goal status:  INITIAL  LONG TERM GOALS: Target date: 11/04/2022    Increase FOTO score to 67 Baseline: 49; 10/20/22 64 Goal status: Ongoing  2.  Increase AROM DF to 10d Baseline:                    A/PROM    L  09/22/22  Knee flexion  [facility]  Knee extension  WFL  Ankle dorsiflexion  -6/0d  Ankle plantarflexion  WFL  Ankle inversion  30d  Ankle eversion  18d   Goal status: INITIAL  3.  Normalize gait pattern by increasing L stance time and push off Baseline: decreased L stance time and push off Goal status: INITIAL  4.  Decrease pain to 4/10 Baseline: 10/10 Goal status: INITIAL  5.  Increase L ankle strength to 4/5 Baseline:                        MMT  L 09/23/22  Ankle dorsiflexion  3+  Ankle plantarflexion  3  Ankle inversion  3+  Ankle eversion  3+   Goal status: INITIAL   PLAN:  PT FREQUENCY: 1-2x/week  PT DURATION: 6 weeks  PLANNED INTERVENTIONS: Therapeutic exercises, Therapeutic activity, Neuromuscular re-education, Balance training, Gait training, Patient/Family education, Self Care, Joint mobilization, Stair training, DME instructions, Dry Needling, Electrical stimulation, Cryotherapy, Moist heat, Manual therapy, and Re-evaluation  PLAN FOR NEXT SESSION: HEP review and update, manual techniques as appropriate, aerobic tasks, ROM and flexibility activities, strengthening and PREs, TPDN, gait and balance training as needed     Eloy End, PT 10/23/2022, 11:41 AM

## 2022-10-25 NOTE — Therapy (Deleted)
OUTPATIENT PHYSICAL THERAPY TREATMENT NOTE   Patient Name: Amy Guerrero MRN: [id number] DOB:1980/02/26, 42 y.o., female Today's Date: 10/25/2022  END OF SESSION:     Past Medical History:  Diagnosis Date   Asthma    Sinusitis    Past Surgical History:  Procedure Laterality Date   CARPAL TUNNEL RELEASE Right    TUBAL LIGATION     tubes tied     Patient Active Problem List   Diagnosis Date Noted   Lymphedema 08/22/2022   Nerve damage 08/22/2022   Dog bite 08/21/2022   Essential hypertension 05/07/2020   Anxiety and depression 05/07/2020    PCP: Rema Fendt, NP   REFERRING PROVIDER: Adonis Huguenin, NP   REFERRING DIAG: [phone] (ICD-10-CM) - Dog bite, initial encounter M67.02 (ICD-10-CM) - Heel cord tightness, left  THERAPY DIAG:  No diagnosis found.  Rationale for Evaluation and Treatment: Rehabilitation  ONSET DATE: 07/19/22  SUBJECTIVE:   SUBJECTIVE STATEMENT:  Pt presents to PT with continued reports of slight L ankle pain. Has been compliant with HEP.    PERTINENT HISTORY: HPI: The patient is a 41 year old woman who presents today in follow-up after a dog bite to the left lower leg on June 2.  Completed a course of Augmentin initially after the injury went on to have subsequent cellulitis completed a second course of Augmentin as well as doxycycline continue to have swelling and pain difficulty bearing weight due to significant pain.   An MRI was concerning for infectious myositis did have another hospitalization was seen by ID.  Antibiotics were stopped.   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 Yes: NPRS scale: 6-7/10 Pain location: L calf Pain description: ache, burn, tingling Aggravating factors: walking, Weight  bearing tasks Relieving factors: nothing  PRECAUTIONS: Fall  RED FLAGS: None   WEIGHT BEARING RESTRICTIONS: No  FALLS:  Has patient fallen in last 6 months? Yes. 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 Puncture wound along the anteromedial proximal-mid left lower leg and posterior proximal-mid lower leg with skin thickening concerning for cellulitis. Muscle edema in the soleus muscle, flexor hallucis longus muscle, and flexor digitorum longus muscle concerning for infectious myositis given the puncture wounds. No intramuscular fluid collection to suggest an abscess. 2. No evidence of osteomyelitis of the left tibia and fibula.     Electronically Signed   By: Elige Ko M.D.   On: 08/19/2022 18:04  PATIENT SURVEYS:  FOTO 49(67 predicted) : 10/20/22 64   MUSCLE LENGTH: N/A   POSTURE: No Significant postural limitations  PALPATION: TTP L achilles tendon but not gastroc  LOWER EXTREMITY ROM:  A/PROM Right eval Left eval 10/15/22  Hip flexion     Hip extension     Hip abduction     Hip adduction     Hip internal rotation     Hip external rotation     Knee flexion  [facility]   Knee extension  WFL   Ankle dorsiflexion  -6/0d 0/5d  Ankle plantarflexion  [facility] WFL  Ankle inversion  30d 30d  Ankle eversion  18d 20   (Blank rows = not tested)  LOWER EXTREMITY MMT:  MMT Right eval Left eval  Hip flexion    Hip extension    Hip abduction    Hip adduction    Hip internal rotation    Hip external rotation    Knee flexion    Knee extension    Ankle dorsiflexion  3+  Ankle plantarflexion  3  Ankle inversion  3+  Ankle eversion  3+   (Blank rows = not tested)  LOWER EXTREMITY SPECIAL TESTS:  N/a  FUNCTIONAL TESTS:  5x STS 24s arms crossed  GAIT: Distance walked: 49ft x2 Assistive device utilized:  None Level of assistance: Modified independence Comments: decreased L stance time and push off   TODAY'S TREATMENT:     [facility] Adult PT Treatment:                                                DATE: 10/23/22 Therapeutic Exercise: Nustep L5 x 6 min while taking subjective B gastroc stretch 30s x2 slant board B soleus stretch 30s x2 slant board Standing rockerboard PF/DF 2x60" Standing RB WS M/L 2x60" Tandem stance 2x30" each Heel raises with tennis ball squeeze 3x15 Eccentric heel raise/lower 2x10 2in L ankle DF/inv/ev 2x10 RTB Seated heel raise 25# KB 2x15 L  [facility] Adult PT Treatment:                                                DATE: 10/20/22 Therapeutic Exercise: Nustep L5 8 min B gastroc stretch 30s x2 slant board B soleus stretch 30s x2 slant board Standing rockerboard PF/DF 2x1' Standing RB WS M/L 2 x1 Runners step 6 in 15x B 5# KB Heel raises with tennis ball squeeze 15x2  [facility] Adult PT Treatment:                                                DATE: 10/15/22 Therapeutic Exercise: Nustep L4 8 min B gastroc stretch 30s x2 slant board B soleus stretch 30s x2 slant board Standing rockerboard PF/DF 2x1' Standing RB WS M/L 2 x1 Runners step 6 in 15x B Heel raises with tennis ball squeeze 15x2  PATIENT EDUCATION:  Education details: Discussed eval findings, rehab rationale and POC and patient is in agreement  Person educated: Patient Education method: Explanation Education comprehension: verbalized understanding and needs further education  HOME EXERCISE PROGRAM: Access Code: [email or web address] URL: [email or web address] Date: 09/23/2022 Prepared by: Gustavus Bryant  Exercises - Long Sitting Plantar Fascia Stretch with Towel  - 2 x daily - 5 x weekly - 1 sets - 2 reps - 30s hold - Heel Toe Raises with Counter Support  - 2 x daily - 5 x weekly - 2 sets - 10 reps - Single Leg Stance with Support  - 2 x daily - 5 x weekly - 1 sets - 2 reps - 10s  hold  ASSESSMENT:  CLINICAL IMPRESSION:  Pt was able to complete all prescribed exercises with no adverse effect. Therapy today focused on ankle strength and proprioception. Continues to progress well with  therapy, will continue per POC as prescribed.   (Eval)Patient is a 42 y.o. female who was seen today for physical therapy evaluation and treatment for L ankle and lower leg pain, paresthesias and weakness following a dog bite.  No redness or warmth detected, no marked edema present.  Patient apprehensive about WB on LLE due to pain.  Symptoms localized to achilles tendon primarily but also extend to anterior ankle joint and lateral ankle along peroneal distribution.  Some inconsistencies noted with resisted strength testing.    OBJECTIVE IMPAIRMENTS: Abnormal gait, decreased activity tolerance, decreased balance, decreased knowledge of condition, decreased mobility, difficulty walking, decreased ROM, decreased strength, and pain.   ACTIVITY LIMITATIONS: squatting, stairs, and locomotion level  PERSONAL FACTORS: Age, Fitness, Past/current experiences, and 1 comorbidity: dog bite  are also affecting patient's functional outcome.   REHAB POTENTIAL: Good  CLINICAL DECISION MAKING: Stable/uncomplicated  EVALUATION COMPLEXITY: Low   GOALS: Goals reviewed with patient? No  SHORT TERM GOALS: Target date: 10/14/2022   Patient to demonstrate independence in HEP  Baseline: [email or web address] Goal status: Met  2.  Decrease pain to 6/10 Baseline: 10/10; 10/20/22 5/10 Goal status: INITIAL  LONG TERM GOALS: Target date: 11/04/2022    Increase FOTO score to 67 Baseline: 49; 10/20/22 64 Goal status: Ongoing  2.  Increase AROM DF to 10d Baseline:                    A/PROM    L  09/22/22  Knee flexion  [facility]  Knee extension  WFL  Ankle dorsiflexion  -6/0d  Ankle plantarflexion  WFL  Ankle inversion  30d  Ankle eversion  18d   Goal status: INITIAL  3.  Normalize gait pattern by increasing L stance  time and push off Baseline: decreased L stance time and push off Goal status: INITIAL  4.  Decrease pain to 4/10 Baseline: 10/10 Goal status: INITIAL  5.  Increase L ankle strength to 4/5 Baseline:                        MMT  L 09/23/22  Ankle dorsiflexion  3+  Ankle plantarflexion  3  Ankle inversion  3+  Ankle eversion  3+   Goal status: INITIAL   PLAN:  PT FREQUENCY: 1-2x/week  PT DURATION: 6 weeks  PLANNED INTERVENTIONS: Therapeutic exercises, Therapeutic activity, Neuromuscular re-education, Balance training, Gait training, Patient/Family education, Self Care, Joint mobilization, Stair training, DME instructions, Dry Needling, Electrical stimulation, Cryotherapy, Moist heat, Manual therapy, and Re-evaluation  PLAN FOR NEXT SESSION: HEP review and update, manual techniques as appropriate, aerobic tasks, ROM and flexibility activities, strengthening and PREs, TPDN, gait and balance training as needed     Hildred Laser, PT 10/25/2022, 4:26 PM

## 2022-10-26 ENCOUNTER — Ambulatory Visit: Payer: BC Managed Care – PPO

## 2022-10-29 ENCOUNTER — Ambulatory Visit: Payer: BC Managed Care – PPO

## 2022-11-01 NOTE — Therapy (Deleted)
OUTPATIENT PHYSICAL THERAPY TREATMENT NOTE   Patient Name: Amy Guerrero MRN: [id number] DOB:Jul 16, 1980, 42 y.o., female Today's Date: 11/01/2022  END OF SESSION:     Past Medical History:  Diagnosis Date   Asthma    Sinusitis    Past Surgical History:  Procedure Laterality Date   CARPAL TUNNEL RELEASE Right    TUBAL LIGATION     tubes tied     Patient Active Problem List   Diagnosis Date Noted   Lymphedema 08/22/2022   Nerve damage 08/22/2022   Dog bite 08/21/2022   Essential hypertension 05/07/2020   Anxiety and depression 05/07/2020    PCP: Rema Fendt, NP   REFERRING PROVIDER: Adonis Huguenin, NP   REFERRING DIAG: [phone] (ICD-10-CM) - Dog bite, initial encounter M67.02 (ICD-10-CM) - Heel cord tightness, left  THERAPY DIAG:  No diagnosis found.  Rationale for Evaluation and Treatment: Rehabilitation  ONSET DATE: 07/19/22  SUBJECTIVE:   SUBJECTIVE STATEMENT:  *** Pt presents to PT with continued reports of slight L ankle pain. Has been compliant with HEP.    PERTINENT HISTORY: HPI: The patient is a 42 year old woman who presents today in follow-up after a dog bite to the left lower leg on June 2.  Completed a course of Augmentin initially after the injury went on to have subsequent cellulitis completed a second course of Augmentin as well as doxycycline continue to have swelling and pain difficulty bearing weight due to significant pain.   An MRI was concerning for infectious myositis did have another hospitalization was seen by ID.  Antibiotics were stopped.   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 Yes: NPRS scale: *** 6-7/10 Pain location: L calf Pain description: ache, burn, tingling Aggravating factors: walking,  Weight bearing tasks Relieving factors: nothing  PRECAUTIONS: Fall  RED FLAGS: None   WEIGHT BEARING RESTRICTIONS: No  FALLS:  Has patient fallen in last 6 months? Yes. 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 Puncture wound along the anteromedial proximal-mid left lower leg and posterior proximal-mid lower leg with skin thickening concerning for cellulitis. Muscle edema in the soleus muscle, flexor hallucis longus muscle, and flexor digitorum longus muscle concerning for infectious myositis given the puncture wounds. No intramuscular fluid collection to suggest an abscess. 2. No evidence of osteomyelitis of the left tibia and fibula.     Electronically Signed   By: Elige Ko M.D.   On: 08/19/2022 18:04  PATIENT SURVEYS:  FOTO 49(67 predicted) : 10/20/22 64   MUSCLE LENGTH: N/A   POSTURE: No Significant postural limitations  PALPATION: TTP L achilles tendon but not gastroc  LOWER EXTREMITY ROM:  A/PROM Right eval Left eval 10/15/22  Hip flexion     Hip extension     Hip abduction     Hip adduction     Hip internal rotation     Hip external rotation     Knee flexion  [facility]   Knee extension  WFL   Ankle dorsiflexion  -6/0d 0/5d  Ankle plantarflexion  [facility] WFL  Ankle inversion  30d 30d  Ankle  eversion  18d 20   (Blank rows = not tested)  LOWER EXTREMITY MMT:  MMT Right eval Left eval  Hip flexion    Hip extension    Hip abduction    Hip adduction    Hip internal rotation    Hip external rotation    Knee flexion    Knee extension    Ankle dorsiflexion  3+  Ankle plantarflexion  3  Ankle inversion  3+  Ankle eversion  3+   (Blank rows = not tested)  LOWER EXTREMITY SPECIAL TESTS:  N/a  FUNCTIONAL TESTS:  5x STS 24s arms crossed  GAIT: Distance walked: 75ft x2 Assistive device  utilized: None Level of assistance: Modified independence Comments: decreased L stance time and push off   TODAY'S TREATMENT:     [facility] Adult PT Treatment:                                                DATE: 10/29/22 Therapeutic Exercise: Nustep L5 x 6 min while taking subjective B gastroc stretch 30s x2 slant board B soleus stretch 30s x2 slant board Standing rockerboard PF/DF 2x60" Standing RB WS M/L 2x60" Tandem stance 2x30" each Heel raises with tennis ball squeeze 3x15 Eccentric heel raise/lower 2x10 2in L ankle DF/inv/ev 2x10 RTB Seated heel raise 25# KB 2x15 L   [facility] Adult PT Treatment:                                                DATE: 10/23/22 Therapeutic Exercise: Nustep L5 x 6 min while taking subjective B gastroc stretch 30s x2 slant board B soleus stretch 30s x2 slant board Standing rockerboard PF/DF 2x60" Standing RB WS M/L 2x60" Tandem stance 2x30" each Heel raises with tennis ball squeeze 3x15 Eccentric heel raise/lower 2x10 2in L ankle DF/inv/ev 2x10 RTB Seated heel raise 25# KB 2x15 L  [facility] Adult PT Treatment:                                                DATE: 10/20/22 Therapeutic Exercise: Nustep L5 8 min B gastroc stretch 30s x2 slant board B soleus stretch 30s x2 slant board Standing rockerboard PF/DF 2x1' Standing RB WS M/L 2 x1 Runners step 6 in 15x B 5# KB Heel raises with tennis ball squeeze 15x2   PATIENT EDUCATION:  Education details: Discussed eval findings, rehab rationale and POC and patient is in agreement  Person educated: Patient Education method: Explanation Education comprehension: verbalized understanding and needs further education  HOME EXERCISE PROGRAM: Access Code: [email or web address] URL: [email or web address] Date: 09/23/2022 Prepared by: Gustavus Bryant  Exercises - Long Sitting Plantar Fascia Stretch with Towel  - 2 x daily - 5 x weekly - 1 sets - 2 reps - 30s hold - Heel Toe Raises with Counter Support  - 2 x  daily - 5 x weekly - 2 sets - 10 reps - Single Leg Stance with Support  - 2 x daily - 5 x weekly - 1 sets - 2 reps - 10s hold  ASSESSMENT:  CLINICAL IMPRESSION:  ***  Pt  was able to complete all prescribed exercises with no adverse effect. Therapy today focused on ankle strength and proprioception. Continues to progress well with therapy, will continue per POC as prescribed.   (Eval)Patient is a 42 y.o. female who was seen today for physical therapy evaluation and treatment for L ankle and lower leg pain, paresthesias and weakness following a dog bite.  No redness or warmth detected, no marked edema present.  Patient apprehensive about WB on LLE due to pain.  Symptoms localized to achilles tendon primarily but also extend to anterior ankle joint and lateral ankle along peroneal distribution.  Some inconsistencies noted with resisted strength testing.    OBJECTIVE IMPAIRMENTS: Abnormal gait, decreased activity tolerance, decreased balance, decreased knowledge of condition, decreased mobility, difficulty walking, decreased ROM, decreased strength, and pain.   ACTIVITY LIMITATIONS: squatting, stairs, and locomotion level  PERSONAL FACTORS: Age, Fitness, Past/current experiences, and 1 comorbidity: dog bite  are also affecting patient's functional outcome.   REHAB POTENTIAL: Good  CLINICAL DECISION MAKING: Stable/uncomplicated  EVALUATION COMPLEXITY: Low   GOALS: Goals reviewed with patient? No  SHORT TERM GOALS: Target date: 10/14/2022   Patient to demonstrate independence in HEP  Baseline: [email or web address] Goal status: Met  2.  Decrease pain to 6/10 Baseline: 10/10; 10/20/22 5/10 Goal status: INITIAL  LONG TERM GOALS: Target date: 11/04/2022    Increase FOTO score to 67 Baseline: 49; 10/20/22 64 Goal status: Ongoing  2.  Increase AROM DF to 10d Baseline:                    A/PROM    L  09/22/22  Knee flexion  [facility]  Knee extension  WFL  Ankle dorsiflexion  -6/0d  Ankle plantarflexion   WFL  Ankle inversion  30d  Ankle eversion  18d   Goal status: INITIAL  3.  Normalize gait pattern by increasing L stance time and push off Baseline: decreased L stance time and push off Goal status: INITIAL  4.  Decrease pain to 4/10 Baseline: 10/10 Goal status: INITIAL  5.  Increase L ankle strength to 4/5 Baseline:                        MMT  L 09/23/22  Ankle dorsiflexion  3+  Ankle plantarflexion  3  Ankle inversion  3+  Ankle eversion  3+   Goal status: INITIAL   PLAN:  PT FREQUENCY: 1-2x/week  PT DURATION: 6 weeks  PLANNED INTERVENTIONS: Therapeutic exercises, Therapeutic activity, Neuromuscular re-education, Balance training, Gait training, Patient/Family education, Self Care, Joint mobilization, Stair training, DME instructions, Dry Needling, Electrical stimulation, Cryotherapy, Moist heat, Manual therapy, and Re-evaluation  PLAN FOR NEXT SESSION: HEP review and update, manual techniques as appropriate, aerobic tasks, ROM and flexibility activities, strengthening and PREs, TPDN, gait and balance training as needed     Hildred Laser, PT 11/01/2022, 8:06 PM

## 2022-11-02 ENCOUNTER — Ambulatory Visit: Payer: BC Managed Care – PPO

## 2022-11-03 ENCOUNTER — Ambulatory Visit (INDEPENDENT_AMBULATORY_CARE_PROVIDER_SITE_OTHER): Payer: BC Managed Care – PPO | Admitting: Family

## 2022-11-03 ENCOUNTER — Ambulatory Visit
Admission: EM | Admit: 2022-11-03 | Discharge: 2022-11-03 | Disposition: A | Discharge status: Home / Self Care | Payer: BC Managed Care – PPO | Attending: Internal Medicine | Admitting: Internal Medicine

## 2022-11-03 ENCOUNTER — Encounter: Payer: Self-pay | Admitting: Emergency Medicine

## 2022-11-03 ENCOUNTER — Other Ambulatory Visit: Payer: Self-pay

## 2022-11-03 DIAGNOSIS — L03012 Cellulitis of left finger: Secondary | ICD-10-CM

## 2022-11-03 DIAGNOSIS — W540XXD Bitten by dog, subsequent encounter: Secondary | ICD-10-CM | POA: Diagnosis not present

## 2022-11-03 DIAGNOSIS — R200 Anesthesia of skin: Secondary | ICD-10-CM

## 2022-11-03 DIAGNOSIS — I890 Lymphedema, not elsewhere classified: Secondary | ICD-10-CM | POA: Diagnosis not present

## 2022-11-03 MED ORDER — MUPIROCIN 2 % EX OINT: 1.0000 | TOPICAL_OINTMENT | Freq: Two times a day (BID) | CUTANEOUS | 0 refills | Status: DC

## 2022-11-03 MED ORDER — AMOXICILLIN-POT CLAVULANATE 875-125 MG PO TABS: 1.0000 | ORAL_TABLET | Freq: Two times a day (BID) | ORAL | 0 refills | Status: DC

## 2022-11-03 NOTE — Discharge Instructions (Signed)
I have sent you an antibiotic to take by mouth as well as an antibiotic that you apply directly to the area.  Monitor closely for any worsening or persistent symptoms and follow-up if they occur.  Monitor blood pressure very closely over the next 24 to 48 hours and follow-up with PCP, ER, urgent care if it remains elevated.

## 2022-11-03 NOTE — ED Provider Notes (Signed)
[name]    CSN: [id number] Arrival date & time: 11/03/22  0908      History   Chief Complaint Chief Complaint  Patient presents with   Hand Pain    HPI Amy Guerrero is a 42 y.o. female.   Patient presents with swelling, redness, pain to the left index finger that started about 4 days ago.  Denies any injury to the area.  Reports that she was biting her fingernails and did her nails pushing her cuticles back so is not sure exactly what caused this.  Denies any fever or purulent drainage from the area.  Patient also concerned about her blood pressure as it is elevated today.  Reports that her blood pressure is intermittently elevated.  She was previously prescribed blood pressure medication by her PCP but states that she was not aware that it was prescribed, and when she went back to PCP appointment it was not elevated so they discontinued it.  Patient is not reporting any current chest pain, shortness of breath, dizziness, headache, nausea, vomiting.   Hand Pain    Past Medical History:  Diagnosis Date   Asthma    Sinusitis     Patient Active Problem List   Diagnosis Date Noted   Lymphedema 08/22/2022   Nerve damage 08/22/2022   Dog bite 08/21/2022   Essential hypertension 05/07/2020   Anxiety and depression 05/07/2020    Past Surgical History:  Procedure Laterality Date   CARPAL TUNNEL RELEASE Right    TUBAL LIGATION     tubes tied      OB History   No obstetric history on file.      Home Medications    Prior to Admission medications   Medication Sig Start Date End Date Taking? Authorizing Provider  amoxicillin-clavulanate (AUGMENTIN) 875-125 MG tablet Take 1 tablet by mouth every 12 (twelve) hours. 11/03/22  Yes Kaylin Marcon, Acie Fredrickson, FNP  mupirocin ointment (BACTROBAN) 2 % Apply 1 Application topically 2 (two) times daily. 11/03/22  Yes Revin Corker, Rolly Salter E, FNP  albuterol (PROVENTIL) (2.5 MG/3ML) 0.083% nebulizer solution Take 3 mLs (2.5 mg total)  by nebulization every 6 (six) hours as needed for wheezing or shortness of breath. 07/31/22   Rema Fendt, NP  albuterol (VENTOLIN HFA) 108 (90 Base) MCG/ACT inhaler Inhale 2 puffs into the lungs every 6 (six) hours as needed for wheezing or shortness of breath. 07/31/22   Rema Fendt, NP  Cetirizine HCl 10 MG CAPS Take 1 capsule (10 mg total) by mouth daily. 12/26/21 03/26/22  Rema Fendt, NP  cyclobenzaprine (FLEXERIL) 5 MG tablet Take 1 tablet (5 mg total) by mouth 2 (two) times daily as needed for muscle spasms. Patient not taking: Reported on 07/31/2022 10/02/21   Gustavus Bryant, FNP  ferrous gluconate (FERGON) 324 MG tablet Take 1 tablet (324 mg total) by mouth daily with breakfast. 08/23/22   Meredeth Ide, MD  gabapentin (NEURONTIN) 300 MG capsule Take 1 capsule (300 mg total) by mouth 3 (three) times daily. 09/08/22   Rema Fendt, NP  ondansetron (ZOFRAN-ODT) 8 MG disintegrating tablet Take 1 tablet (8 mg total) by mouth every 8 (eight) hours as needed for nausea. Patient not taking: Reported on 07/31/2022 02/10/21   Derwood Kaplan, MD  oxyCODONE (OXY IR/ROXICODONE) 5 MG immediate release tablet Take 1 tablet (5 mg total) by mouth every 6 (six) hours as needed for moderate pain. 08/23/22   Meredeth Ide, MD  Spacer/Aero-Holding Chambers (AEROCHAMBER  PLUS) inhaler Use as instructed Patient not taking: Reported on 07/31/2022 05/07/20   Rema Fendt, NP  dicyclomine (BENTYL) 20 MG tablet Take 1 tablet (20 mg total) by mouth 4 (four) times daily -  before meals and at bedtime. Patient not taking: Reported on 07/17/2020 06/18/20 07/22/20  Wieters, Hallie C, PA-C  famotidine (PEPCID) 40 MG tablet Take 1 tablet (40 mg total) by mouth daily. Patient not taking: Reported on 07/17/2020 06/18/20 07/22/20  Wieters, Hallie C, PA-C  lisinopril (ZESTRIL) 5 MG tablet Take 1 tablet (5 mg total) by mouth daily. Patient not taking: Reported on 07/17/2020 05/07/20 07/22/20  Rema Fendt, NP  sertraline (ZOLOFT) 25 MG  tablet Take 1 tablet (25 mg total) by mouth at bedtime. Patient not taking: Reported on 07/17/2020 05/07/20 07/22/20  Rema Fendt, NP    Family History Family History  Problem Relation Age of Onset   Diabetes Mother    Diabetes Father     Social History Social History   Tobacco Use   Smoking status: Never   Smokeless tobacco: Never  Vaping Use   Vaping status: Former  Substance Use Topics   Alcohol use: Yes    Alcohol/week: 1.0 standard drink of alcohol    Types: 1 Glasses of wine per week    Comment: occassionally   Drug use: Yes    Types: Marijuana     Allergies   Vancomycin   Review of Systems Review of Systems Per HPI  Physical Exam Triage Vital Signs ED Triage Vitals [11/03/22 1122]  Encounter Vitals Group     BP (!) 172/101     Systolic BP Percentile      Diastolic BP Percentile      Pulse Rate 74     Resp 18     Temp 98.8 F (37.1 C)     Temp Source Oral     SpO2 99 %     Weight      Height      Head Circumference      Peak Flow      Pain Score 7     Pain Loc      Pain Education      Exclude from Growth Chart    No data found.  Updated Vital Signs BP (!) 172/101 (BP Location: Left Arm)   Pulse 74   Temp 98.8 F (37.1 C) (Oral)   Resp 18   SpO2 99%   Visual Acuity Right Eye Distance:   Left Eye Distance:   Bilateral Distance:    Right Eye Near:   Left Eye Near:    Bilateral Near:     Physical Exam Constitutional:      General: She is not in acute distress.    Appearance: Normal appearance. She is not toxic-appearing or diaphoretic.  HENT:     Head: Normocephalic and atraumatic.  Eyes:     Extraocular Movements: Extraocular movements intact.     Conjunctiva/sclera: Conjunctivae normal.  Pulmonary:     Effort: Pulmonary effort is normal.  Skin:    Comments: Patient has mild swelling and erythema with purulent fluctuant area present to the medial portion of the skin surrounding the nail of the left second digit.  No purulent  drainage noted. Patient has full range of motion of finger.  Capillary refill and pulses intact.  Neurological:     General: No focal deficit present.     Mental Status: She is alert and oriented to person, place,  and time. Mental status is at baseline.  Psychiatric:        Mood and Affect: Mood normal.        Behavior: Behavior normal.        Thought Content: Thought content normal.        Judgment: Judgment normal.      UC Treatments / Results  Labs (all labs ordered are listed, but only abnormal results are displayed) Labs Reviewed - No data to display  EKG   Radiology No results found.  Procedures Procedures (including critical care time)  Medications Ordered in UC Medications - No data to display  Initial Impression / Assessment and Plan / UC Course  I have reviewed the triage vital signs and the nursing notes.  Pertinent labs & imaging results that were available during my care of the patient were reviewed by me and considered in my medical decision making (see chart for details).     Patient has paronychia of left index finger.  Attempted to drain with 18-gauge needle with only minimal purulent drainage noted.  Will prescribe mupirocin to apply topically and Augmentin antibiotic.  Patient has been on several antibiotics from June to July given dog bite but reports she has been off medication for about 1 to 2 months so Augmentin should be reasonable.  Advised patient to monitor closely and follow-up with PCP or urgent care if it persists or worsens.  Patient's blood pressure is elevated but she states that it is only intermittently elevated.  Encouraged her to monitor closely with home blood pressure cuff for the next 24 hours and follow-up with PCP or urgent care if it remains elevated.  She is currently asymptomatic regarding blood pressure which is reassuring. Suspect pain could be contributing to BP.  Patient verbalized understanding and was agreeable with plan. Final  Clinical Impressions(s) / UC Diagnoses   Final diagnoses:  Paronychia of finger of left hand     Discharge Instructions      I have sent you an antibiotic to take by mouth as well as an antibiotic that you apply directly to the area.  Monitor closely for any worsening or persistent symptoms and follow-up if they occur.  Monitor blood pressure very closely over the next 24 to 48 hours and follow-up with PCP, ER, urgent care if it remains elevated.    ED Prescriptions     Medication Sig Dispense Auth. Provider   amoxicillin-clavulanate (AUGMENTIN) 875-125 MG tablet Take 1 tablet by mouth every 12 (twelve) hours. 14 tablet [place], [place] E, [place]   mupirocin ointment (BACTROBAN) 2 % Apply 1 Application topically 2 (two) times daily. 22 g Gustavus Bryant, [place]      PDMP not reviewed this encounter.   Gustavus Bryant, [place] 11/03/22 1158

## 2022-11-03 NOTE — ED Triage Notes (Signed)
Pt here for swelling and pain to left index finger around cuticle x 4 days

## 2022-11-04 ENCOUNTER — Encounter: Payer: Self-pay | Admitting: Family

## 2022-11-04 NOTE — Progress Notes (Signed)
Office Visit Note   Patient: Amy Guerrero           Date of Birth: 1981-01-30           MRN: [id number] Visit Date: 11/03/2022              Requested by: Rema Fendt, NP [address] [place],  [place] [address] PCP: Rema Fendt, NP  Chief Complaint  Patient presents with   Left Foot - Follow-up      HPI: The patient is a 42 year old woman who presents today with a primary complaint of numbness to the left lateral ankle.  She complains of some numbness and tingling electrical pain shooting over the dorsum of her foot specifically along the lateral aspect.  She sustained a dog bite nearly 4 months ago now and had issues with cellulitis subsequent edema to the left lower extremity for months.  The swelling and pain associated with the bite has mostly subsided.  She has completed several visits of physical therapy feels that her strength and range of motion are improved she is able to get around as usual unfortunately she continues to have paresthesias of the lateral foot and ankle  She is using ibuprofen and gabapentin 300 mg 3 times daily without relief.  Requesting referral to a "nerve specialist"    Assessment & Plan: Visit Diagnoses:  1. Numbness   2. Dog bite, subsequent encounter   3. Lymphedema     Plan: Will plan for nerve conduction studies of the left lower extremity.  Consider referral pending results.  No loss of strength today  She would like to hold off on increasing her gabapentin  Follow-Up Instructions: Return for p NCS.   Left Ankle Exam  Swelling: mild  Range of Motion  The patient has normal left ankle ROM.   Muscle Strength  The patient has normal left ankle strength.  Other  Erythema: absent Scars: present Sensation: decreased Pulse: present      Patient is alert, oriented, no adenopathy, well-dressed, normal affect, normal respiratory effort. Decree sensation to the posterior lateral ankle and lateral column.   Full active range of motion.  Diffuse tenderness to the lateral ankle.  Imaging: No results found. No images are attached to the encounter.  Labs: Lab Results  Component Value Date   HGBA1C 5.4 07/17/2020   REPTSTATUS 08/26/2022 FINAL 08/21/2022   GRAMSTAIN  05/24/2007    NO WBC SEEN NO SQUAMOUS EPITHELIAL CELLS SEEN NO ORGANISMS SEEN   CULT  08/21/2022    NO GROWTH 5 DAYS Performed at [facility], [address] [place] [address], [place], [place] [address]      Lab Results  Component Value Date   ALBUMIN 4.4 07/17/2020   ALBUMIN 3.8 06/16/2017   ALBUMIN 2.7 (L) 05/26/2007    Lab Results  Component Value Date   MG 2.2 06/16/2017   No results found for: "VD25OH"  No results found for: "PREALBUMIN"    Latest Ref Rng & Units 08/22/2022    6:52 PM 08/21/2022   11:50 PM 08/21/2022    9:53 PM  CBC EXTENDED  WBC 4.0 - 10.5 K/uL  3.2  2.6   RBC 3.87 - 5.11 MIL/uL 4.44  4.29  4.52   Hemoglobin 12.0 - 15.0 g/dL  9.8  95.6   HCT 38.7 - 46.0 %  32.1  33.7   Platelets 150 - 400 K/uL  217  237      There is  no height or weight on file to calculate BMI.  Orders:  No orders of the defined types were placed in this encounter.  No orders of the defined types were placed in this encounter.    Procedures: No procedures performed  Clinical Data: No additional findings.  ROS:  All other systems negative, except as noted in the HPI. Review of Systems  Constitutional: Negative.   Musculoskeletal:  Negative for gait problem.  Neurological:  Positive for numbness. Negative for weakness.    Objective: Vital Signs: There were no vitals taken for this visit.  Specialty Comments:  No specialty comments available.  PMFS History: Patient Active Problem List   Diagnosis Date Noted   Lymphedema 08/22/2022   Nerve damage 08/22/2022   Dog bite 08/21/2022   Essential hypertension 05/07/2020   Anxiety and depression 05/07/2020   Past Medical History:  Diagnosis Date   Asthma     Sinusitis     Family History  Problem Relation Age of Onset   Diabetes Mother    Diabetes Father     Past Surgical History:  Procedure Laterality Date   CARPAL TUNNEL RELEASE Right    TUBAL LIGATION     tubes tied     Social History   Occupational History   Not on file  Tobacco Use   Smoking status: Never   Smokeless tobacco: Never  Vaping Use   Vaping status: Former  Substance and Sexual Activity   Alcohol use: Yes    Alcohol/week: 1.0 standard drink of alcohol    Types: 1 Glasses of wine per week    Comment: occassionally   Drug use: Yes    Types: Marijuana   Sexual activity: Not on file

## 2022-11-05 ENCOUNTER — Ambulatory Visit: Payer: BC Managed Care – PPO

## 2022-11-27 ENCOUNTER — Encounter: Payer: BC Managed Care – PPO | Admitting: Family

## 2022-12-23 ENCOUNTER — Encounter (HOSPITAL_COMMUNITY): Payer: Self-pay

## 2022-12-23 ENCOUNTER — Emergency Department (HOSPITAL_COMMUNITY)
Admission: EM | Admit: 2022-12-23 | Discharge: 2022-12-23 | Disposition: A | Discharge status: Home / Self Care | Payer: BC Managed Care – PPO | Attending: Emergency Medicine | Admitting: Emergency Medicine

## 2022-12-23 ENCOUNTER — Other Ambulatory Visit: Payer: Self-pay

## 2022-12-23 DIAGNOSIS — M79662 Pain in left lower leg: Secondary | ICD-10-CM | POA: Diagnosis present

## 2022-12-23 DIAGNOSIS — M79605 Pain in left leg: Secondary | ICD-10-CM

## 2022-12-23 MED ORDER — NAPROXEN 375 MG PO TABS: 375.0000 mg | ORAL_TABLET | Freq: Two times a day (BID) | ORAL | 0 refills | Status: DC

## 2022-12-23 MED ORDER — KETOROLAC TROMETHAMINE 30 MG/ML IJ SOLN
15.0000 mg | Freq: Once | INTRAMUSCULAR | Status: AC
  Administered 2022-12-23: 15 mg via INTRAMUSCULAR
  Filled 2022-12-23: qty 1

## 2022-12-23 NOTE — ED Provider Notes (Signed)
Patient is angry and aggressive stating " nothing has been done for me. I attempted to apologize for patient's frustration.  I am still in pain"  EDP politely explained that patient has been seen in follow up but none of the tests for ongoing chronic pain are available here tonight.  I politely explained I will set up outpatient DVT study to ensure no additional issues and will refer to neurology.  I have advised patient to also follow up with PMD and her sports medicine doctor.  I also explained I would order a dose of toradol for pain.  The issue is chronic.  There is no new trauma. The patient is stable for discharge.      Eleonora Peeler, MD 12/23/22 [phone]

## 2022-12-23 NOTE — ED Provider Notes (Signed)
[place] [facility] AT [facility] Provider Note   CSN: [id number] Arrival date & time: 12/23/22  2010     History  Chief Complaint  Patient presents with   Leg Pain    Amy Guerrero is a 42 y.o. female.  The history is provided by the patient.  Leg Pain Location:  Foot and ankle Time since incident:  5 months Ankle location:  L ankle Pain details:    Timing:  Constant   Progression:  Unchanged Foreign body present:  No foreign bodies Tetanus status:  Up to date Prior injury to area:  Yes (dog bit 5 months ago, has seen sports medicine and PT and had MRI without relief) Relieved by:  Nothing Worsened by:  Nothing Associated symptoms: no fever and no muscle weakness   Risk factors: no concern for non-accidental trauma   Patient with anxiety and depression and history of dog bit to the left lower extremity presents with pain.  No new injury. Patient has had MRI and follow up Xrays of the area.       Home Medications Prior to Admission medications   Medication Sig Start Date End Date Taking? Authorizing Provider  naproxen (NAPROSYN) 375 MG tablet Take 1 tablet (375 mg total) by mouth 2 (two) times daily with a meal. 12/23/22  Yes Makyi Ledo, MD  albuterol (PROVENTIL) (2.5 MG/3ML) 0.083% nebulizer solution Take 3 mLs (2.5 mg total) by nebulization every 6 (six) hours as needed for wheezing or shortness of breath. 07/31/22   Rema Fendt, NP  albuterol (VENTOLIN HFA) 108 (90 Base) MCG/ACT inhaler Inhale 2 puffs into the lungs every 6 (six) hours as needed for wheezing or shortness of breath. 07/31/22   Rema Fendt, NP  amoxicillin-clavulanate (AUGMENTIN) 875-125 MG tablet Take 1 tablet by mouth every 12 (twelve) hours. 11/03/22   Gustavus Bryant, FNP  Cetirizine HCl 10 MG CAPS Take 1 capsule (10 mg total) by mouth daily. 12/26/21 03/26/22  Rema Fendt, NP  cyclobenzaprine (FLEXERIL) 5 MG tablet Take 1 tablet (5 mg total) by mouth 2 (two) times  daily as needed for muscle spasms. Patient not taking: Reported on 07/31/2022 10/02/21   Gustavus Bryant, FNP  ferrous gluconate (FERGON) 324 MG tablet Take 1 tablet (324 mg total) by mouth daily with breakfast. 08/23/22   Meredeth Ide, MD  gabapentin (NEURONTIN) 300 MG capsule Take 1 capsule (300 mg total) by mouth 3 (three) times daily. 09/08/22   Rema Fendt, NP  mupirocin ointment (BACTROBAN) 2 % Apply 1 Application topically 2 (two) times daily. 11/03/22   Gustavus Bryant, FNP  ondansetron (ZOFRAN-ODT) 8 MG disintegrating tablet Take 1 tablet (8 mg total) by mouth every 8 (eight) hours as needed for nausea. Patient not taking: Reported on 07/31/2022 02/10/21   Derwood Kaplan, MD  oxyCODONE (OXY IR/ROXICODONE) 5 MG immediate release tablet Take 1 tablet (5 mg total) by mouth every 6 (six) hours as needed for moderate pain. 08/23/22   Meredeth Ide, MD  Spacer/Aero-Holding Chambers (AEROCHAMBER PLUS) inhaler Use as instructed Patient not taking: Reported on 07/31/2022 05/07/20   Rema Fendt, NP  dicyclomine (BENTYL) 20 MG tablet Take 1 tablet (20 mg total) by mouth 4 (four) times daily -  before meals and at bedtime. Patient not taking: Reported on 07/17/2020 06/18/20 07/22/20  Wieters, Hallie C, PA-C  famotidine (PEPCID) 40 MG tablet Take 1 tablet (40 mg total) by mouth daily. Patient not taking:  Reported on 07/17/2020 06/18/20 07/22/20  Wieters, Hallie C, PA-C  lisinopril (ZESTRIL) 5 MG tablet Take 1 tablet (5 mg total) by mouth daily. Patient not taking: Reported on 07/17/2020 05/07/20 07/22/20  Rema Fendt, NP  sertraline (ZOLOFT) 25 MG tablet Take 1 tablet (25 mg total) by mouth at bedtime. Patient not taking: Reported on 07/17/2020 05/07/20 07/22/20  Rema Fendt, NP      Allergies    Vancomycin    Review of Systems   Review of Systems  Constitutional:  Negative for fever.  Respiratory:  Negative for wheezing and stridor.   Cardiovascular:  Negative for chest pain.  Musculoskeletal:  Positive for  arthralgias.  All other systems reviewed and are negative.   Physical Exam Updated Vital Signs BP (!) 154/91 (BP Location: Left Arm)   Pulse 70   Temp 98.6 F (37 C) (Oral)   Resp 16   Ht 5\' 1"  (1.549 m)   Wt 68.9 kg   LMP 11/23/2022 (Exact Date)   SpO2 100%   BMI 28.72 kg/m  Physical Exam Vitals and nursing note reviewed.  Constitutional:      General: She is not in acute distress.    Appearance: She is well-developed.  HENT:     Head: Normocephalic and atraumatic.     Nose: Nose normal.  Eyes:     Pupils: Pupils are equal, round, and reactive to light.  Cardiovascular:     Rate and Rhythm: Normal rate and regular rhythm.     Pulses: Normal pulses.     Heart sounds: Normal heart sounds.  Pulmonary:     Effort: Pulmonary effort is normal. No respiratory distress.     Breath sounds: Normal breath sounds.  Abdominal:     General: Bowel sounds are normal. There is no distension.     Palpations: Abdomen is soft.     Tenderness: There is no abdominal tenderness. There is no guarding or rebound.  Musculoskeletal:        General: Normal range of motion.     Cervical back: Neck supple.     Left lower leg: No swelling, deformity, lacerations or bony tenderness.     Left ankle: No deformity, ecchymosis or lacerations. Normal pulse.     Left Achilles Tendon: Normal.     Left foot: Normal. Normal range of motion and normal capillary refill. No deformity, bunion, Charcot foot, laceration, tenderness, bony tenderness or crepitus. Normal pulse.  Skin:    General: Skin is warm and dry.     Capillary Refill: Capillary refill takes less than 2 seconds.     Findings: No erythema or rash.  Neurological:     General: No focal deficit present.     Deep Tendon Reflexes: Reflexes normal.     ED Results / Procedures / Treatments   Labs (all labs ordered are listed, but only abnormal results are displayed) Labs Reviewed - No data to display  EKG None  Radiology No results  found.  Procedures Procedures    Medications Ordered in ED Medications  ketorolac (TORADOL) 30 MG/ML injection 15 mg (has no administration in time range)    ED Course/ Medical Decision Making/ A&P                                 Medical Decision Making Patient with ongoing Left ankle and foot pain post dog bite 5 months ago  Amount and/or Complexity  of Data Reviewed External Data Reviewed: radiology and notes.    Details: Previous notes and Xrays reviewed   Risk Prescription drug management. Risk Details: Patient with ongoing pain post dog bite, full range of motion of the left lower extremity.  No new trauma.  No redness no warmth.  Pain is likely nerve related.  But given ongoing symptoms I  have ordered an outpatient DVT study to ensure no DVT of the left lower extremity, these orders have been placed inEPIC  I will also refer to neurology for further nerve testing.     Final Clinical Impression(s) / ED Diagnoses Final diagnoses:  Left leg pain   Return for intractable cough, coughing up blood, fevers > 100.4 unrelieved by medication, shortness of breath, intractable vomiting, chest pain, shortness of breath, weakness, numbness, changes in speech, facial asymmetry, abdominal pain, passing out, Inability to tolerate liquids or food, cough, altered mental status or any concerns. No signs of systemic illness or infection. The patient is nontoxic-appearing on exam and vital signs are within normal limits.  I have reviewed the triage vital signs and the nursing notes. Pertinent labs & imaging results that were available during my care of the patient were reviewed by me and considered in my medical decision making (see chart for details). After history, exam, and medical workup I feel the patient has been appropriately medically screened and is safe for discharge home. Pertinent diagnoses were discussed with the patient. Patient was given return precautions.  Rx / DC Orders ED  Discharge Orders          Ordered    LE VENOUS        12/23/22 2328    naproxen (NAPROSYN) 375 MG tablet  2 times daily with meals        12/23/22 2329              Orean Giarratano, MD 12/23/22 2339

## 2022-12-23 NOTE — ED Triage Notes (Signed)
Patient reports left lower leg pain. Patient states she was bit by a dog 5 months ago. Patient states since then she has had increased pain in her leg and foot. Patient thinks she has nerve damage. Patient states the area is swollen. Also states the pain is sharp and shooting and last for a long time then  goes away. Pain starts at her ankle. Also reports it hurts when she walks. Patient has had Xrays, MRI and been seen by here PCP for the pain. Patient takes gabapentin TID with no relief.

## 2022-12-24 ENCOUNTER — Ambulatory Visit (HOSPITAL_COMMUNITY)
Admission: RE | Admit: 2022-12-24 | Discharge: 2022-12-24 | Disposition: A | Discharge status: Home / Self Care | Payer: BC Managed Care – PPO | Source: Ambulatory Visit | Attending: Emergency Medicine | Admitting: Emergency Medicine

## 2022-12-24 ENCOUNTER — Telehealth: Payer: Self-pay | Admitting: Family

## 2022-12-24 DIAGNOSIS — M79605 Pain in left leg: Secondary | ICD-10-CM | POA: Diagnosis present

## 2022-12-24 DIAGNOSIS — M7989 Other specified soft tissue disorders: Secondary | ICD-10-CM | POA: Diagnosis not present

## 2022-12-24 NOTE — Telephone Encounter (Signed)
Copied from CRM [id number]. Topic: General - Other >> Dec 24, 2022  8:18 AM Phill Myron wrote: Question regarding  todays appt at 11am ...should I go to this vascular study? Please advise

## 2022-12-24 NOTE — Progress Notes (Signed)
Lower extremity venous duplex completed. Please see CV Procedures for preliminary results.  Shona Simpson, RVT 12/24/22 12:50 PM

## 2022-12-25 ENCOUNTER — Telehealth: Payer: Self-pay | Admitting: Family

## 2022-12-25 NOTE — Telephone Encounter (Signed)
Copied from CRM [id number]. Topic: General - Other >> Dec 25, 2022 10:32 AM Epimenio Foot F wrote: Reason for CRM: Pt is calling in wanting to know if her Ultrasound results have been viewed. Please follow up with pt.

## 2022-12-28 NOTE — Telephone Encounter (Signed)
Patient had US done at hospital and is very much concern about results. Patient also says she has nerve damage to her foot and would like a referral to some place.Patient is willing to discuss at her appt

## 2022-12-28 NOTE — Telephone Encounter (Signed)
No recent ultrasounds ordered at [facility]. Please confirm with patient additional details as this may have been completed by another provider. Please let me know if I can further assist. Thank you.

## 2022-12-29 NOTE — Telephone Encounter (Signed)
I have attempted without success to contact this patient by phone to I left a message on answering machine.

## 2022-12-29 NOTE — Telephone Encounter (Signed)
Call patient with update. Schedule appointment. Result summary listed below. During the interim report to the Emergency Department/Urgent Care/call 911 for immediate medical evaluation.  Lower Venous DVT Study 12/24/2022: Summary:  Right:  - No evidence of common femoral vein obstruction.   Left:  - There is no evidence of deep vein thrombosis in the lower extremity.  - A cystic structure is found in the popliteal fossa.

## 2023-01-06 ENCOUNTER — Other Ambulatory Visit (HOSPITAL_COMMUNITY)
Admission: RE | Admit: 2023-01-06 | Discharge: 2023-01-06 | Disposition: A | Discharge status: Home / Self Care | Payer: BC Managed Care – PPO | Source: Ambulatory Visit | Attending: Family | Admitting: Family

## 2023-01-06 ENCOUNTER — Telehealth: Payer: Self-pay | Admitting: Family

## 2023-01-06 ENCOUNTER — Ambulatory Visit (INDEPENDENT_AMBULATORY_CARE_PROVIDER_SITE_OTHER): Payer: BC Managed Care – PPO | Admitting: Family

## 2023-01-06 VITALS — BP 121/84 | HR 87 | Temp 98.1°F | Resp 16 | Wt 154.0 lb

## 2023-01-06 DIAGNOSIS — Z13220 Encounter for screening for lipoid disorders: Secondary | ICD-10-CM

## 2023-01-06 DIAGNOSIS — R079 Chest pain, unspecified: Secondary | ICD-10-CM | POA: Diagnosis not present

## 2023-01-06 DIAGNOSIS — Z131 Encounter for screening for diabetes mellitus: Secondary | ICD-10-CM

## 2023-01-06 DIAGNOSIS — R109 Unspecified abdominal pain: Secondary | ICD-10-CM | POA: Insufficient documentation

## 2023-01-06 DIAGNOSIS — Z0001 Encounter for general adult medical examination with abnormal findings: Secondary | ICD-10-CM | POA: Diagnosis not present

## 2023-01-06 DIAGNOSIS — Z1329 Encounter for screening for other suspected endocrine disorder: Secondary | ICD-10-CM

## 2023-01-06 DIAGNOSIS — Z124 Encounter for screening for malignant neoplasm of cervix: Secondary | ICD-10-CM

## 2023-01-06 DIAGNOSIS — M79605 Pain in left leg: Secondary | ICD-10-CM

## 2023-01-06 DIAGNOSIS — Z8249 Family history of ischemic heart disease and other diseases of the circulatory system: Secondary | ICD-10-CM

## 2023-01-06 DIAGNOSIS — Z0000 Encounter for general adult medical examination without abnormal findings: Secondary | ICD-10-CM | POA: Diagnosis present

## 2023-01-06 DIAGNOSIS — Z130 Encounter for screening for diseases of the blood and blood-forming organs and certain disorders involving the immune mechanism: Secondary | ICD-10-CM

## 2023-01-06 DIAGNOSIS — W540XXS Bitten by dog, sequela: Secondary | ICD-10-CM

## 2023-01-06 DIAGNOSIS — Z1231 Encounter for screening mammogram for malignant neoplasm of breast: Secondary | ICD-10-CM

## 2023-01-06 DIAGNOSIS — Z13228 Encounter for screening for other metabolic disorders: Secondary | ICD-10-CM

## 2023-01-06 NOTE — Addendum Note (Signed)
Addended by: Claudie Leach on: 01/06/2023 11:41 AM   Modules accepted: Orders

## 2023-01-06 NOTE — Telephone Encounter (Signed)
Copied from CRM [id number]. Topic: General - Other >> Jan 06, 2023  4:41 PM Turkey B wrote: Reason for CRM: elliana from cytology lab called in about urine orderss, ned more clarification. She states they can't do BV and chlamydia, but other std's they can. Please cb to discuss

## 2023-01-06 NOTE — Progress Notes (Signed)
Patient ID: Amy Guerrero, female    DOB: 18-Mar-1980  MRN: [id number]  CC: Annual Exam  Subjective: Amy Guerrero is a 42 y.o. female who presents for annual exam.   Her concerns today include:  - States left lower extremity still experiencing pain since dog bite. She denies red flag symptoms. States she does not prefer pain medication. Reports she was seen by Physical Therapy x 6 weeks. Reports she has not been seen by Orthopedics and Occupational Therapy.  - States bilateral ears feel "clogged" and difficulty hearing. She denies red flag symptoms. Reports history of frequent ear infections. Reports in the past she was told that she will likely need ear tubes.  - Intermittent chest pain. Reports last episodes several days ago. Family history of heart attack. Reports she does smoke marijuana.   - Intermittent abdominal pain. Denies red flag symptoms.   Patient Active Problem List   Diagnosis Date Noted   Lymphedema 08/22/2022   Nerve damage 08/22/2022   Dog bite 08/21/2022   Pain in finger of right hand 07/07/2022   Essential hypertension 05/07/2020   Anxiety and depression 05/07/2020     Current Outpatient Medications on File Prior to Visit  Medication Sig Dispense Refill   albuterol (PROVENTIL) (2.5 MG/3ML) 0.083% nebulizer solution Take 3 mLs (2.5 mg total) by nebulization every 6 (six) hours as needed for wheezing or shortness of breath. 150 mL 2   albuterol (VENTOLIN HFA) 108 (90 Base) MCG/ACT inhaler Inhale 2 puffs into the lungs every 6 (six) hours as needed for wheezing or shortness of breath. 8 g 2   ferrous gluconate (FERGON) 324 MG tablet Take 1 tablet (324 mg total) by mouth daily with breakfast. 30 tablet 3   gabapentin (NEURONTIN) 300 MG capsule Take 1 capsule (300 mg total) by mouth 3 (three) times daily. 90 capsule 0   mupirocin ointment (BACTROBAN) 2 % Apply 1 Application topically 2 (two) times daily. 22 g 0   naproxen (NAPROSYN) 375 MG tablet Take 1 tablet (375  mg total) by mouth 2 (two) times daily with a meal. 14 tablet 0   amoxicillin-clavulanate (AUGMENTIN) 500-125 MG tablet Take 1 tablet by mouth 3 (three) times daily. (Patient not taking: Reported on 01/06/2023)     amoxicillin-clavulanate (AUGMENTIN) 875-125 MG tablet Take 1 tablet by mouth every 12 (twelve) hours. (Patient not taking: Reported on 01/06/2023) 14 tablet 0   amoxicillin-clavulanate (AUGMENTIN) 875-125 MG tablet Take 1 tablet by mouth every 12 (twelve) hours. (Patient not taking: Reported on 01/06/2023)     Cetirizine HCl 10 MG CAPS Take 1 capsule (10 mg total) by mouth daily. 90 capsule 0   cyclobenzaprine (FLEXERIL) 5 MG tablet Take 1 tablet (5 mg total) by mouth 2 (two) times daily as needed for muscle spasms. (Patient not taking: Reported on 07/31/2022) 20 tablet 0   ondansetron (ZOFRAN-ODT) 8 MG disintegrating tablet Take 1 tablet (8 mg total) by mouth every 8 (eight) hours as needed for nausea. (Patient not taking: Reported on 07/31/2022) 10 tablet 0   oxyCODONE (OXY IR/ROXICODONE) 5 MG immediate release tablet Take 1 tablet (5 mg total) by mouth every 6 (six) hours as needed for moderate pain. (Patient not taking: Reported on 01/06/2023) 15 tablet 0   predniSONE (DELTASONE) 20 MG tablet TAKE 2 TABLETS BY MOUTH EVERY DAY FOR 5 DAYS     Spacer/Aero-Holding Chambers (AEROCHAMBER PLUS) inhaler Use as instructed (Patient not taking: Reported on 07/31/2022) 1 each 2   [DISCONTINUED] dicyclomine (  BENTYL) 20 MG tablet Take 1 tablet (20 mg total) by mouth 4 (four) times daily -  before meals and at bedtime. (Patient not taking: Reported on 07/17/2020) 20 tablet 0   [DISCONTINUED] famotidine (PEPCID) 40 MG tablet Take 1 tablet (40 mg total) by mouth daily. (Patient not taking: Reported on 07/17/2020) 20 tablet 0   [DISCONTINUED] lisinopril (ZESTRIL) 5 MG tablet Take 1 tablet (5 mg total) by mouth daily. (Patient not taking: Reported on 07/17/2020) 30 tablet 1   [DISCONTINUED] sertraline (ZOLOFT) 25 MG  tablet Take 1 tablet (25 mg total) by mouth at bedtime. (Patient not taking: Reported on 07/17/2020) 30 tablet 0   No current facility-administered medications on file prior to visit.    Allergies  Allergen Reactions   Vancomycin Itching and Rash    Social History   Socioeconomic History   Marital status: Single    Spouse name: Not on file   Number of children: Not on file   Years of education: Not on file   Highest education level: Not on file  Occupational History   Not on file  Tobacco Use   Smoking status: Never   Smokeless tobacco: Never  Vaping Use   Vaping status: Former  Substance and Sexual Activity   Alcohol use: Yes    Alcohol/week: 1.0 standard drink of alcohol    Types: 1 Glasses of wine per week    Comment: occassionally   Drug use: Yes    Types: Marijuana   Sexual activity: Not on file  Other Topics Concern   Not on file  Social History Narrative   Not on file   Social Determinants of Health   Financial Resource Strain: Medium Risk (08/10/2022)   Overall Financial Resource Strain (CARDIA)    Difficulty of Paying Living Expenses: Somewhat hard  Food Insecurity: No Food Insecurity (08/22/2022)   Hunger Vital Sign    Worried About Running Out of Food in the Last Year: Never true    Ran Out of Food in the Last Year: Never true  Transportation Needs: No Transportation Needs (08/22/2022)   PRAPARE - Administrator, Civil Service (Medical): No    Lack of Transportation (Non-Medical): No  Physical Activity: Unknown (01/06/2023)   Exercise Vital Sign    Days of Exercise per Week: 5 days    Minutes of Exercise per Session: Not on file  Stress: Stress Concern Present (01/06/2023)   [employer] of Occupational Health - Occupational Stress Questionnaire    Feeling of Stress : Very much  Social Connections: Socially Isolated (01/06/2023)   Social Connection and Isolation Panel [NHANES]    Frequency of Communication with Friends and Family: More  than three times a week    Frequency of Social Gatherings with Friends and Family: More than three times a week    Attends Religious Services: Never    Database administrator or Organizations: No    Attends Engineer, structural: Not on file    Marital Status: Never married  Intimate Partner Violence: Not At Risk (08/22/2022)   Humiliation, Afraid, Rape, and Kick questionnaire    Fear of Current or Ex-Partner: No    Emotionally Abused: No    Physically Abused: No    Sexually Abused: No    Family History  Problem Relation Age of Onset   Diabetes Mother    Diabetes Father     Past Surgical History:  Procedure Laterality Date   CARPAL TUNNEL RELEASE Right  TUBAL LIGATION     tubes tied      ROS: Review of Systems Negative except as stated above  PHYSICAL EXAM: BP 121/84   Pulse 87   Temp 98.1 F (36.7 C) (Oral)   Resp 16   Wt 154 lb (69.9 kg)   LMP 11/23/2022 (Exact Date)   SpO2 98%   BMI 29.10 kg/m   Physical Exam HENT:     Head: Normocephalic and atraumatic.     Right Ear: Tympanic membrane, ear canal and external ear normal.     Left Ear: Tympanic membrane, ear canal and external ear normal.     Nose: Nose normal.     Mouth/Throat:     Mouth: Mucous membranes are moist.     Pharynx: Oropharynx is clear.  Eyes:     Extraocular Movements: Extraocular movements intact.     Conjunctiva/sclera: Conjunctivae normal.     Pupils: Pupils are equal, round, and reactive to light.  Neck:     Thyroid: No thyroid mass, thyromegaly or thyroid tenderness.  Cardiovascular:     Rate and Rhythm: Normal rate and regular rhythm.     Pulses: Normal pulses.     Heart sounds: Normal heart sounds.  Pulmonary:     Effort: Pulmonary effort is normal.     Breath sounds: Normal breath sounds.  Chest:     Comments: Patient declined.  Abdominal:     General: Bowel sounds are normal.     Palpations: Abdomen is soft.     Tenderness: There is abdominal tenderness.   Genitourinary:    Comments: Patient declined.  Musculoskeletal:        General: Normal range of motion.     Right shoulder: Normal.     Left shoulder: Normal.     Right upper arm: Normal.     Left upper arm: Normal.     Right elbow: Normal.     Left elbow: Normal.     Right forearm: Normal.     Left forearm: Normal.     Right wrist: Normal.     Left wrist: Normal.     Right hand: Normal.     Left hand: Normal.     Cervical back: Normal, normal range of motion and neck supple.     Thoracic back: Normal.     Lumbar back: Normal.     Right hip: Normal.     Left hip: Normal.     Right upper leg: Normal.     Left upper leg: Normal.     Right knee: Normal.     Left knee: Normal.     Right lower leg: Normal.     Left lower leg: Normal.     Right ankle: Normal.     Left ankle: Normal.     Right foot: Normal.     Left foot: Normal.  Skin:    General: Skin is warm and dry.     Capillary Refill: Capillary refill takes less than 2 seconds.  Neurological:     General: No focal deficit present.     Mental Status: She is alert and oriented to person, place, and time.  Psychiatric:        Mood and Affect: Mood normal.        Behavior: Behavior normal.     ASSESSMENT AND PLAN: 1. Annual physical exam - Counseled on 150 minutes of exercise per week as tolerated, healthy eating (including decreased daily intake of saturated fats, cholesterol, added  sugars, sodium), STI prevention, and routine healthcare maintenance.  2. Screening for metabolic disorder - Routine screening.  - CMP14+EGFR  3. Screening for deficiency anemia - Routine screening.  - CBC  4. Diabetes mellitus screening - Routine screening.  - Hemoglobin A1c  5. Screening cholesterol level - Routine screening.  - Lipid panel  6. Thyroid disorder screen - Routine screening.  - TSH  7. Encounter for screening mammogram for malignant neoplasm of breast - Routine screening.  - MM Digital Screening;  Future  8. Cervical cancer screening - Referral to Gynecology for evaluation/management. - Ambulatory referral to Gynecology  9. Chest pain, unspecified type 10. Family history of heart attack - Patient today in office with no cardiopulmonary/acute distress.  - Routine screening.  - Referral to Cardiology for evaluation/management.  - EKG 12-Lead - DG Chest 2 View; Future - Ambulatory referral to Cardiology  11. Abdominal pain, unspecified abdominal location - Routine screening.  - POCT URINALYSIS DIP (CLINITEK); Future - Cervicovaginal ancillary only - POCT urine pregnancy; Future  12. Dog bite, sequela 13. Left leg pain - Referral to Orthopedic Surgery and Occupational Therapy for evaluation/management.  - Ambulatory referral to Orthopedic Surgery - Ambulatory referral to Occupational Therapy    Patient was given the opportunity to ask questions.  Patient verbalized understanding of the plan and was able to repeat key elements of the plan. Patient was given clear instructions to go to Emergency Department or return to medical center if symptoms don't improve, worsen, or new problems develop.The patient verbalized understanding.   Orders Placed This Encounter  Procedures   MM Digital Screening   DG Chest 2 View   CBC   Lipid panel   CMP14+EGFR   Hemoglobin A1c   TSH   Ambulatory referral to Orthopedic Surgery   Ambulatory referral to Occupational Therapy   Ambulatory referral to Gynecology   Ambulatory referral to Cardiology   POCT URINALYSIS DIP (CLINITEK)   POCT urine pregnancy   EKG 12-Lead     Return in about 1 year (around 01/06/2024) for Physical per patient preference.  Rema Fendt, NP

## 2023-01-07 ENCOUNTER — Other Ambulatory Visit: Payer: Self-pay | Admitting: Family

## 2023-01-07 DIAGNOSIS — D72819 Decreased white blood cell count, unspecified: Secondary | ICD-10-CM

## 2023-01-07 LAB — CBC
Hematocrit: 41.9 % (ref 34.0–46.6)
Hemoglobin: 12.7 g/dL (ref 11.1–15.9)
MCH: 24.5 pg — ABNORMAL LOW (ref 26.6–33.0)
MCHC: 30.3 g/dL — ABNORMAL LOW (ref 31.5–35.7)
MCV: 81 fL (ref 79–97)
Platelets: 263 10*3/uL (ref 150–450)
RBC: 5.18 x10E6/uL (ref 3.77–5.28)
RDW: 15.4 % (ref 11.7–15.4)
WBC: 2.7 10*3/uL — ABNORMAL LOW (ref 3.4–10.8)

## 2023-01-07 LAB — CMP14+EGFR
ALT: 12 [IU]/L (ref 0–32)
AST: 18 [IU]/L (ref 0–40)
Albumin: 4.3 g/dL (ref 3.9–4.9)
Alkaline Phosphatase: 79 [IU]/L (ref 44–121)
BUN/Creatinine Ratio: 13 (ref 9–23)
BUN: 12 mg/dL (ref 6–24)
Bilirubin Total: 0.6 mg/dL (ref 0.0–1.2)
CO2: 22 mmol/L (ref 20–29)
Calcium: 9.4 mg/dL (ref 8.7–10.2)
Chloride: 104 mmol/L (ref 96–106)
Creatinine, Ser: 0.94 mg/dL (ref 0.57–1.00)
Globulin, Total: 3.3 g/dL (ref 1.5–4.5)
Glucose: 88 mg/dL (ref 70–99)
Potassium: 3.9 mmol/L (ref 3.5–5.2)
Sodium: 137 mmol/L (ref 134–144)
Total Protein: 7.6 g/dL (ref 6.0–8.5)
eGFR: 77 mL/min/{1.73_m2} (ref >59)

## 2023-01-07 LAB — LIPID PANEL
Chol/HDL Ratio: 2.2 ratio (ref 0.0–4.4)
Cholesterol, Total: 170 mg/dL (ref 100–199)
HDL: 79 mg/dL (ref >39)
LDL Chol Calc (NIH): 84 mg/dL (ref 0–99)
Triglycerides: 30 mg/dL (ref 0–149)
VLDL Cholesterol Cal: 7 mg/dL (ref 5–40)

## 2023-01-07 LAB — HEMOGLOBIN A1C
Est. average glucose Bld gHb Est-mCnc: 114 mg/dL
Hgb A1c MFr Bld: 5.6 % (ref 4.8–5.6)

## 2023-01-07 LAB — TSH: TSH: 0.711 u[IU]/mL (ref 0.450–4.500)

## 2023-01-08 LAB — CERVICOVAGINAL ANCILLARY ONLY
Chlamydia: NEGATIVE
Comment: NEGATIVE
Comment: NEGATIVE
Comment: NORMAL
Neisseria Gonorrhea: NEGATIVE
Trichomonas: NEGATIVE

## 2023-01-08 NOTE — Telephone Encounter (Signed)
Spoke to someone at lab, she stated it was resolved.

## 2023-01-11 ENCOUNTER — Encounter (HOSPITAL_BASED_OUTPATIENT_CLINIC_OR_DEPARTMENT_OTHER): Payer: Self-pay | Admitting: Student

## 2023-01-11 ENCOUNTER — Ambulatory Visit (HOSPITAL_BASED_OUTPATIENT_CLINIC_OR_DEPARTMENT_OTHER): Payer: BC Managed Care – PPO | Admitting: Student

## 2023-01-11 ENCOUNTER — Other Ambulatory Visit (HOSPITAL_BASED_OUTPATIENT_CLINIC_OR_DEPARTMENT_OTHER): Payer: Self-pay

## 2023-01-11 DIAGNOSIS — R200 Anesthesia of skin: Secondary | ICD-10-CM | POA: Diagnosis not present

## 2023-01-11 DIAGNOSIS — M25572 Pain in left ankle and joints of left foot: Secondary | ICD-10-CM | POA: Diagnosis not present

## 2023-01-11 NOTE — Progress Notes (Signed)
Chief Complaint: Left ankle pain     History of Present Illness:    Amy Guerrero is a 42 y.o. female presenting today for follow-up of her left ankle.  She reports continued numbness and tingling into the lateral left ankle and foot.  States that she also has decreased sensation into all 5 toes.  Was seen in the emergency department on 12/23/22 for this and had a Doppler ultrasound performed which was negative for DVT.  This did reveal a cyst in the popliteal fossa which she is concerned could be contributing to her symptoms.  She has been back to work full-time and overall is tolerating this well.  Surgical History:   None  PMH/PSH/Family History/Social History/Meds/Allergies:    Past Medical History:  Diagnosis Date   Asthma    Sinusitis    Past Surgical History:  Procedure Laterality Date   CARPAL TUNNEL RELEASE Right    TUBAL LIGATION     tubes tied     Social History   Socioeconomic History   Marital status: Single    Spouse name: Not on file   Number of children: Not on file   Years of education: Not on file   Highest education level: Not on file  Occupational History   Not on file  Tobacco Use   Smoking status: Never   Smokeless tobacco: Never  Vaping Use   Vaping status: Former  Substance and Sexual Activity   Alcohol use: Yes    Alcohol/week: 1.0 standard drink of alcohol    Types: 1 Glasses of wine per week    Comment: occassionally   Drug use: Yes    Types: Marijuana   Sexual activity: Not on file  Other Topics Concern   Not on file  Social History Narrative   Not on file   Social Determinants of Health   Financial Resource Strain: Medium Risk (08/10/2022)   Overall Financial Resource Strain (CARDIA)    Difficulty of Paying Living Expenses: Somewhat hard  Food Insecurity: No Food Insecurity (08/22/2022)   Hunger Vital Sign    Worried About Running Out of Food in the Last Year: Never true    Ran Out of Food in  the Last Year: Never true  Transportation Needs: No Transportation Needs (08/22/2022)   PRAPARE - Administrator, Civil Service (Medical): No    Lack of Transportation (Non-Medical): No  Physical Activity: Unknown (01/06/2023)   Exercise Vital Sign    Days of Exercise per Week: 5 days    Minutes of Exercise per Session: Not on file  Stress: Stress Concern Present (01/06/2023)   [employer] of Occupational Health - Occupational Stress Questionnaire    Feeling of Stress : Very much  Social Connections: Socially Isolated (01/06/2023)   Social Connection and Isolation Panel [NHANES]    Frequency of Communication with Friends and Family: More than three times a week    Frequency of Social Gatherings with Friends and Family: More than three times a week    Attends Religious Services: Never    Database administrator or Organizations: No    Attends Engineer, structural: Not on file    Marital Status: Never married   Family History  Problem Relation Age of Onset   Diabetes Mother    Diabetes  Father    Allergies  Allergen Reactions   Vancomycin Itching and Rash   Current Outpatient Medications  Medication Sig Dispense Refill   albuterol (PROVENTIL) (2.5 MG/3ML) 0.083% nebulizer solution Take 3 mLs (2.5 mg total) by nebulization every 6 (six) hours as needed for wheezing or shortness of breath. 150 mL 2   albuterol (VENTOLIN HFA) 108 (90 Base) MCG/ACT inhaler Inhale 2 puffs into the lungs every 6 (six) hours as needed for wheezing or shortness of breath. 8 g 2   amoxicillin-clavulanate (AUGMENTIN) 500-125 MG tablet Take 1 tablet by mouth 3 (three) times daily. (Patient not taking: Reported on 01/06/2023)     amoxicillin-clavulanate (AUGMENTIN) 875-125 MG tablet Take 1 tablet by mouth every 12 (twelve) hours. (Patient not taking: Reported on 01/06/2023) 14 tablet 0   amoxicillin-clavulanate (AUGMENTIN) 875-125 MG tablet Take 1 tablet by mouth every 12 (twelve) hours.  (Patient not taking: Reported on 01/06/2023)     Cetirizine HCl 10 MG CAPS Take 1 capsule (10 mg total) by mouth daily. 90 capsule 0   cyclobenzaprine (FLEXERIL) 5 MG tablet Take 1 tablet (5 mg total) by mouth 2 (two) times daily as needed for muscle spasms. (Patient not taking: Reported on 07/31/2022) 20 tablet 0   ferrous gluconate (FERGON) 324 MG tablet Take 1 tablet (324 mg total) by mouth daily with breakfast. 30 tablet 3   gabapentin (NEURONTIN) 300 MG capsule Take 1 capsule (300 mg total) by mouth 3 (three) times daily. 90 capsule 0   mupirocin ointment (BACTROBAN) 2 % Apply 1 Application topically 2 (two) times daily. 22 g 0   naproxen (NAPROSYN) 375 MG tablet Take 1 tablet (375 mg total) by mouth 2 (two) times daily with a meal. 14 tablet 0   ondansetron (ZOFRAN-ODT) 8 MG disintegrating tablet Take 1 tablet (8 mg total) by mouth every 8 (eight) hours as needed for nausea. (Patient not taking: Reported on 07/31/2022) 10 tablet 0   oxyCODONE (OXY IR/ROXICODONE) 5 MG immediate release tablet Take 1 tablet (5 mg total) by mouth every 6 (six) hours as needed for moderate pain. (Patient not taking: Reported on 01/06/2023) 15 tablet 0   predniSONE (DELTASONE) 20 MG tablet TAKE 2 TABLETS BY MOUTH EVERY DAY FOR 5 DAYS     Spacer/Aero-Holding Chambers (AEROCHAMBER PLUS) inhaler Use as instructed (Patient not taking: Reported on 07/31/2022) 1 each 2   No current facility-administered medications for this visit.   No results found.  Review of Systems:   A ROS was performed including pertinent positives and negatives as documented in the HPI.  Physical Exam :   Constitutional: NAD and appears stated age Neurological: Alert and oriented Psych: Appropriate affect and cooperative Last menstrual period 11/23/2022.   Comprehensive Musculoskeletal Exam:    Full range of motion of the left knee from 0-120 degrees.  Palpable Baker's cyst in the posterior knee.  No joint line tenderness or instability  with varus/valgus stress.  Mild tenderness to palpation throughout the left ankle.  Active ankle ROM from 15 degrees dorsiflexion to 30 degrees plantarflexion.  No tenderness, redness, or swelling in the calf.  Decreased sensation noted in the lateral ankle and lateral foot.  Dorsalis pedis pulse 2+.  Imaging:    Assessment:   42 y.o. female now almost 6 months status post dog bite injury to the left leg.  Her function and range of motion has significantly improved however she continues to have decreased sensation over the lateral and posterior ankle and foot.  Recent Doppler ultrasound did rule out a DVT however revealed that a cyst in the posterior knee consistent with a Baker's cyst.  This does not however appear to be causing her numbness and paresthesias.  Patient was seen by my colleague Barnie Del on 9/17 who discussed having a nerve study completed however this was never done.  At this point I would recommend proceeding with nerve study for further evaluation so will plan to reorder this today.  Will defer back to [facility] for further management or referral after the studies completed.  Plan :    -Obtain NCS of the left lower extremity and follow back up with Barnie Del, NP    I personally saw and evaluated the patient, and participated in the management and treatment plan.  Hazle Nordmann, PA-C Orthopedics

## 2023-01-12 ENCOUNTER — Encounter: Payer: Self-pay | Admitting: Neurology

## 2023-01-12 ENCOUNTER — Other Ambulatory Visit: Payer: Self-pay

## 2023-01-12 DIAGNOSIS — R202 Paresthesia of skin: Secondary | ICD-10-CM

## 2023-01-17 ENCOUNTER — Other Ambulatory Visit: Payer: Self-pay

## 2023-01-17 ENCOUNTER — Encounter (HOSPITAL_COMMUNITY): Payer: Self-pay

## 2023-01-17 ENCOUNTER — Emergency Department (HOSPITAL_COMMUNITY): Payer: BC Managed Care – PPO

## 2023-01-17 ENCOUNTER — Ambulatory Visit
Admission: EM | Admit: 2023-01-17 | Discharge: 2023-01-17 | Disposition: A | Discharge status: Home / Self Care | Payer: BC Managed Care – PPO

## 2023-01-17 ENCOUNTER — Emergency Department (HOSPITAL_COMMUNITY)
Admission: EM | Admit: 2023-01-17 | Discharge: 2023-01-17 | Disposition: A | Discharge status: Home / Self Care | Payer: BC Managed Care – PPO | Attending: Emergency Medicine | Admitting: Emergency Medicine

## 2023-01-17 DIAGNOSIS — R531 Weakness: Secondary | ICD-10-CM | POA: Insufficient documentation

## 2023-01-17 DIAGNOSIS — Z79899 Other long term (current) drug therapy: Secondary | ICD-10-CM | POA: Insufficient documentation

## 2023-01-17 DIAGNOSIS — I10 Essential (primary) hypertension: Secondary | ICD-10-CM | POA: Insufficient documentation

## 2023-01-17 DIAGNOSIS — R195 Other fecal abnormalities: Secondary | ICD-10-CM

## 2023-01-17 LAB — TYPE AND SCREEN
ABO/RH(D): B NEG
Antibody Screen: NEGATIVE

## 2023-01-17 LAB — CBC
HCT: 39.3 % (ref 36.0–46.0)
Hemoglobin: 12.8 g/dL (ref 12.0–15.0)
MCH: 25.1 pg — ABNORMAL LOW (ref 26.0–34.0)
MCHC: 32.6 g/dL (ref 30.0–36.0)
MCV: 77.2 fL — ABNORMAL LOW (ref 80.0–100.0)
Platelets: 201 10*3/uL (ref 150–400)
RBC: 5.09 MIL/uL (ref 3.87–5.11)
RDW: 15.7 % — ABNORMAL HIGH (ref 11.5–15.5)
WBC: 3.3 10*3/uL — ABNORMAL LOW (ref 4.0–10.5)
nRBC: 0 % (ref 0.0–0.2)

## 2023-01-17 LAB — COMPREHENSIVE METABOLIC PANEL
ALT: 17 U/L (ref 0–44)
AST: 20 U/L (ref 15–41)
Albumin: 3.8 g/dL (ref 3.5–5.0)
Alkaline Phosphatase: 67 U/L (ref 38–126)
Anion gap: 8 (ref 5–15)
BUN: 12 mg/dL (ref 6–20)
CO2: 21 mmol/L — ABNORMAL LOW (ref 22–32)
Calcium: 8.5 mg/dL — ABNORMAL LOW (ref 8.9–10.3)
Chloride: 106 mmol/L (ref 98–111)
Creatinine, Ser: 0.98 mg/dL (ref 0.44–1.00)
GFR, Estimated: >60 mL/min (ref >60)
Glucose, Bld: 90 mg/dL (ref 70–99)
Potassium: 3.5 mmol/L (ref 3.5–5.1)
Sodium: 135 mmol/L (ref 135–145)
Total Bilirubin: 0.5 mg/dL (ref <1.2)
Total Protein: 7.5 g/dL (ref 6.5–8.1)

## 2023-01-17 LAB — POC OCCULT BLOOD, ED: Fecal Occult Bld: NEGATIVE

## 2023-01-17 LAB — LIPASE, BLOOD: Lipase: 27 U/L (ref 11–51)

## 2023-01-17 LAB — HCG, QUANTITATIVE, PREGNANCY: hCG, Beta Chain, Quant, S: <1 m[IU]/mL (ref <5)

## 2023-01-17 NOTE — ED Triage Notes (Signed)
Pt states that she woke up with black stools. X1 day  Pt states that she also is feeling fatigued. X2 days Pt states that she also has numbness of her left hand at times.  Pt was diagnosed with carpal tunnel and due for surgery.

## 2023-01-17 NOTE — ED Notes (Signed)
Patient is being discharged from the Urgent Care and sent to the Emergency Department via POV . Per Lurena Joiner Rising, PA-C, patient is in need of higher level of care due to weakness and tarry stools. Patient is aware and verbalizes understanding of plan of care.  Vitals:   01/17/23 1509  BP: (!) 168/93  Pulse: 68  Resp: 17  Temp: 99 F (37.2 C)

## 2023-01-17 NOTE — ED Provider Notes (Signed)
[place] EMERGENCY DEPARTMENT AT [facility] Provider Note   CSN: [id number] Arrival date & time: 01/17/23  1548     History {Add pertinent medical, surgical, social history, OB history to HPI:1} Chief Complaint  Patient presents with   Weakness    Amy Guerrero is a 42 y.o. female.  She has a history of hypertension.  Complaining of feeling generally weak since yesterday.  Trouble getting herself up and staying awake.  No fevers chills nausea vomiting.  Today she noticed 1 episode of dark sticky bowel movement.  No abdominal pain no urinary symptoms.  Last menstrual period was about a month ago.  She is on iron supplements.  The history is provided by the patient.  Weakness Severity:  Moderate Onset quality:  Gradual Duration:  2 days Timing:  Constant Progression:  Unchanged Chronicity:  New Relieved by:  None tried Worsened by:  Activity Ineffective treatments:  None tried Associated symptoms: no abdominal pain, no chest pain, no cough, no diarrhea, no dysuria, no fever, no nausea, no shortness of breath and no vomiting        Home Medications Prior to Admission medications   Medication Sig Start Date End Date Taking? Authorizing Provider  albuterol (PROVENTIL) (2.5 MG/3ML) 0.083% nebulizer solution Take 3 mLs (2.5 mg total) by nebulization every 6 (six) hours as needed for wheezing or shortness of breath. 07/31/22   Rema Fendt, NP  albuterol (VENTOLIN HFA) 108 (90 Base) MCG/ACT inhaler Inhale 2 puffs into the lungs every 6 (six) hours as needed for wheezing or shortness of breath. 07/31/22   Rema Fendt, NP  amoxicillin-clavulanate (AUGMENTIN) 500-125 MG tablet Take 1 tablet by mouth 3 (three) times daily. Patient not taking: Reported on 01/06/2023    [provider]  amoxicillin-clavulanate (AUGMENTIN) 875-125 MG tablet Take 1 tablet by mouth every 12 (twelve) hours. Patient not taking: Reported on 01/06/2023 11/03/22   Gustavus Bryant, FNP   amoxicillin-clavulanate (AUGMENTIN) 875-125 MG tablet Take 1 tablet by mouth every 12 (twelve) hours. Patient not taking: Reported on 01/06/2023    [provider]  Cetirizine HCl 10 MG CAPS Take 1 capsule (10 mg total) by mouth daily. 12/26/21 03/26/22  Rema Fendt, NP  cyclobenzaprine (FLEXERIL) 5 MG tablet Take 1 tablet (5 mg total) by mouth 2 (two) times daily as needed for muscle spasms. Patient not taking: Reported on 07/31/2022 10/02/21   Gustavus Bryant, FNP  ferrous gluconate (FERGON) 324 MG tablet Take 1 tablet (324 mg total) by mouth daily with breakfast. 08/23/22   Meredeth Ide, MD  gabapentin (NEURONTIN) 300 MG capsule Take 1 capsule (300 mg total) by mouth 3 (three) times daily. 09/08/22   Rema Fendt, NP  mupirocin ointment (BACTROBAN) 2 % Apply 1 Application topically 2 (two) times daily. 11/03/22   Gustavus Bryant, FNP  naproxen (NAPROSYN) 375 MG tablet Take 1 tablet (375 mg total) by mouth 2 (two) times daily with a meal. 12/23/22   Palumbo, April, MD  ondansetron (ZOFRAN-ODT) 8 MG disintegrating tablet Take 1 tablet (8 mg total) by mouth every 8 (eight) hours as needed for nausea. Patient not taking: Reported on 07/31/2022 02/10/21   Derwood Kaplan, MD  oxyCODONE (OXY IR/ROXICODONE) 5 MG immediate release tablet Take 1 tablet (5 mg total) by mouth every 6 (six) hours as needed for moderate pain. Patient not taking: Reported on 01/06/2023 08/23/22   Meredeth Ide, MD  predniSONE (DELTASONE) 20 MG tablet TAKE  2 TABLETS BY MOUTH EVERY DAY FOR 5 DAYS    [provider]  Spacer/Aero-Holding Chambers (AEROCHAMBER PLUS) inhaler Use as instructed Patient not taking: Reported on 07/31/2022 05/07/20   Rema Fendt, NP  dicyclomine (BENTYL) 20 MG tablet Take 1 tablet (20 mg total) by mouth 4 (four) times daily -  before meals and at bedtime. Patient not taking: Reported on 07/17/2020 06/18/20 07/22/20  Wieters, Hallie C, PA-C  famotidine (PEPCID) 40 MG tablet Take 1 tablet (40 mg  total) by mouth daily. Patient not taking: Reported on 07/17/2020 06/18/20 07/22/20  Wieters, Hallie C, PA-C  lisinopril (ZESTRIL) 5 MG tablet Take 1 tablet (5 mg total) by mouth daily. Patient not taking: Reported on 07/17/2020 05/07/20 07/22/20  Rema Fendt, NP  sertraline (ZOLOFT) 25 MG tablet Take 1 tablet (25 mg total) by mouth at bedtime. Patient not taking: Reported on 07/17/2020 05/07/20 07/22/20  Rema Fendt, NP      Allergies    Vancomycin    Review of Systems   Review of Systems  Constitutional:  Negative for fever.  Eyes:  Negative for visual disturbance.  Respiratory:  Negative for cough and shortness of breath.   Cardiovascular:  Negative for chest pain.  Gastrointestinal:  Negative for abdominal pain, diarrhea, nausea and vomiting.  Genitourinary:  Negative for dysuria.  Neurological:  Positive for weakness.    Physical Exam Updated Vital Signs BP (!) 168/90 (BP Location: Left Arm)   Pulse 63   Temp 99.3 F (37.4 C) (Oral)   Resp 16   Ht 5\' 1"  (1.549 m)   Wt 70.3 kg   LMP 12/24/2022 (Exact Date)   SpO2 98%   BMI 29.29 kg/m  Physical Exam Vitals and nursing note reviewed.  Constitutional:      General: She is not in acute distress.    Appearance: She is well-developed.  HENT:     Head: Normocephalic and atraumatic.  Eyes:     Conjunctiva/sclera: Conjunctivae normal.  Cardiovascular:     Rate and Rhythm: Normal rate and regular rhythm.     Heart sounds: No murmur heard. Pulmonary:     Effort: Pulmonary effort is normal. No respiratory distress.     Breath sounds: Normal breath sounds.  Abdominal:     Palpations: Abdomen is soft.     Tenderness: There is no abdominal tenderness.  Genitourinary:    Comments: Rectal exam done with tech as chaperone.  Normal tone no masses.  Sample sent to lab for guaiac. Musculoskeletal:        General: No swelling.     Cervical back: Neck supple.  Skin:    General: Skin is warm and dry.     Capillary Refill: Capillary  refill takes less than 2 seconds.  Neurological:     General: No focal deficit present.     Mental Status: She is alert.     Sensory: No sensory deficit.     Motor: No weakness.     ED Results / Procedures / Treatments   Labs (all labs ordered are listed, but only abnormal results are displayed) Labs Reviewed  CBC - Abnormal; Notable for the following components:      Result Value   WBC 3.3 (*)    MCV 77.2 (*)    MCH 25.1 (*)    RDW 15.7 (*)    All other components within normal limits  COMPREHENSIVE METABOLIC PANEL - Abnormal; Notable for the following components:   CO2 21 (*)  Calcium 8.5 (*)    All other components within normal limits  LIPASE, BLOOD  HCG, QUANTITATIVE, PREGNANCY  POC OCCULT BLOOD, ED  TYPE AND SCREEN    EKG None  Radiology No results found.  Procedures Procedures  {Document cardiac monitor, telemetry assessment procedure when appropriate:1}  Medications Ordered in ED Medications - No data to display  ED Course/ Medical Decision Making/ A&P   {   Click here for ABCD2, HEART and other calculatorsREFRESH Note before signing :1}                              Medical Decision Making Amount and/or Complexity of Data Reviewed Labs: ordered. Radiology: ordered.   This patient complains of ***; this involves an extensive number of treatment Options and is a complaint that carries with it a high risk of complications and morbidity. The differential includes ***  I ordered, reviewed and interpreted labs, which included *** I ordered medication *** and reviewed PMP when indicated. I ordered imaging studies which included *** and I independently    visualized and interpreted imaging which showed *** Additional history obtained from *** Previous records obtained and reviewed *** I consulted *** and discussed lab and imaging findings and discussed disposition.  Cardiac monitoring reviewed, *** Social determinants considered, *** Critical  Interventions: ***  After the interventions stated above, I reevaluated the patient and found *** Admission and further testing considered, ***   {Document critical care time when appropriate:1} {Document review of labs and clinical decision tools ie heart score, Chads2Vasc2 etc:1}  {Document your independent review of radiology images, and any outside records:1} {Document your discussion with family members, caretakers, and with consultants:1} {Document social determinants of health affecting pt's care:1} {Document your decision making why or why not admission, treatments were needed:1} Final Clinical Impression(s) / ED Diagnoses Final diagnoses:  None    Rx / DC Orders ED Discharge Orders     None

## 2023-01-17 NOTE — ED Triage Notes (Signed)
Patient reports fatigue, weakness, and loss of appetite x 2 days. Also reports black tarry stools x 1 day. States she has been taking iron for awhile and stools do not normally look like this. Denies vomiting, fevers, and abdominal pain.

## 2023-01-17 NOTE — ED Provider Notes (Signed)
[name]    CSN: [id number] Arrival date & time: 01/17/23  1450     History   Chief Complaint Chief Complaint  Patient presents with   Melena    X1 day black tools   Fatigue    Fatigue x2 days   Hand Problem    Left hand numbness    HPI Amy Guerrero is a 42 y.o. female.  Fatigue and weakness for the last few days This morning had black and "sticky" looking stool Some nausea, no vomiting No fever Reports history of anemia. Has never needed transfusion   Past Medical History:  Diagnosis Date   Asthma    Sinusitis     Patient Active Problem List   Diagnosis Date Noted   Lymphedema 08/22/2022   Nerve damage 08/22/2022   Dog bite 08/21/2022   Pain in finger of right hand 07/07/2022   Essential hypertension 05/07/2020   Anxiety and depression 05/07/2020    Past Surgical History:  Procedure Laterality Date   CARPAL TUNNEL RELEASE Right    TUBAL LIGATION     tubes tied      OB History   No obstetric history on file.      Home Medications    Prior to Admission medications   Medication Sig Start Date End Date Taking? Authorizing Provider  ferrous gluconate (FERGON) 324 MG tablet Take 1 tablet (324 mg total) by mouth daily with breakfast. 08/23/22  Yes Sharl Ma, Sarina Ill, MD  gabapentin (NEURONTIN) 300 MG capsule Take 1 capsule (300 mg total) by mouth 3 (three) times daily. 09/08/22  Yes Zonia Kief, Amy J, NP  albuterol (PROVENTIL) (2.5 MG/3ML) 0.083% nebulizer solution Take 3 mLs (2.5 mg total) by nebulization every 6 (six) hours as needed for wheezing or shortness of breath. 07/31/22   Rema Fendt, NP  albuterol (VENTOLIN HFA) 108 (90 Base) MCG/ACT inhaler Inhale 2 puffs into the lungs every 6 (six) hours as needed for wheezing or shortness of breath. 07/31/22   Rema Fendt, NP  amoxicillin-clavulanate (AUGMENTIN) 500-125 MG tablet Take 1 tablet by mouth 3 (three) times daily. Patient not taking: Reported on 01/06/2023    [provider]  amoxicillin-clavulanate (AUGMENTIN) 875-125 MG tablet Take 1 tablet by mouth every 12 (twelve) hours. Patient not taking: Reported on 01/06/2023 11/03/22   Gustavus Bryant, FNP  amoxicillin-clavulanate (AUGMENTIN) 875-125 MG tablet Take 1 tablet by mouth every 12 (twelve) hours. Patient not taking: Reported on 01/06/2023    [provider]  Cetirizine HCl 10 MG CAPS Take 1 capsule (10 mg total) by mouth daily. 12/26/21 03/26/22  Rema Fendt, NP  cyclobenzaprine (FLEXERIL) 5 MG tablet Take 1 tablet (5 mg total) by mouth 2 (two) times daily as needed for muscle spasms. Patient not taking: Reported on 07/31/2022 10/02/21   Gustavus Bryant, FNP  mupirocin ointment (BACTROBAN) 2 % Apply 1 Application topically 2 (two) times daily. 11/03/22   Gustavus Bryant, FNP  naproxen (NAPROSYN) 375 MG tablet Take 1 tablet (375 mg total) by mouth 2 (two) times daily with a meal. 12/23/22   Palumbo, April, MD  ondansetron (ZOFRAN-ODT) 8 MG disintegrating tablet Take 1 tablet (8 mg total) by mouth every 8 (eight) hours as needed for nausea. Patient not taking: Reported on 07/31/2022 02/10/21   Derwood Kaplan, MD  oxyCODONE (OXY IR/ROXICODONE) 5 MG immediate release tablet Take 1 tablet (5 mg total) by mouth every 6 (six) hours as needed for moderate pain. Patient  not taking: Reported on 01/06/2023 08/23/22   Meredeth Ide, MD  predniSONE (DELTASONE) 20 MG tablet TAKE 2 TABLETS BY MOUTH EVERY DAY FOR 5 DAYS    [provider]  Spacer/Aero-Holding Chambers (AEROCHAMBER PLUS) inhaler Use as instructed Patient not taking: Reported on 07/31/2022 05/07/20   Rema Fendt, NP  dicyclomine (BENTYL) 20 MG tablet Take 1 tablet (20 mg total) by mouth 4 (four) times daily -  before meals and at bedtime. Patient not taking: Reported on 07/17/2020 06/18/20 07/22/20  Wieters, Hallie C, PA-C  famotidine (PEPCID) 40 MG tablet Take 1 tablet (40 mg total) by mouth daily. Patient not taking: Reported on 07/17/2020 06/18/20 07/22/20   Wieters, Hallie C, PA-C  lisinopril (ZESTRIL) 5 MG tablet Take 1 tablet (5 mg total) by mouth daily. Patient not taking: Reported on 07/17/2020 05/07/20 07/22/20  Rema Fendt, NP  sertraline (ZOLOFT) 25 MG tablet Take 1 tablet (25 mg total) by mouth at bedtime. Patient not taking: Reported on 07/17/2020 05/07/20 07/22/20  Rema Fendt, NP    Family History Family History  Problem Relation Age of Onset   Diabetes Mother    Diabetes Father     Social History Social History   Tobacco Use   Smoking status: Never   Smokeless tobacco: Never  Vaping Use   Vaping status: Former  Substance Use Topics   Alcohol use: Yes    Alcohol/week: 1.0 standard drink of alcohol    Types: 1 Glasses of wine per week    Comment: occassionally   Drug use: Yes    Types: Marijuana     Allergies   Vancomycin   Review of Systems Review of Systems Per HPI  Physical Exam Triage Vital Signs ED Triage Vitals  Encounter Vitals Group     BP 01/17/23 1509 (!) 168/93     Systolic BP Percentile --      Diastolic BP Percentile --      Pulse Rate 01/17/23 1509 68     Resp 01/17/23 1509 17     Temp 01/17/23 1509 99 F (37.2 C)     Temp Source 01/17/23 1509 Oral     SpO2 --      Weight 01/17/23 1507 154 lb (69.9 kg)     Height 01/17/23 1507 5\' 1"  (1.549 m)     Head Circumference --      Peak Flow --      Pain Score 01/17/23 1507 0     Pain Loc --      Pain Education --      Exclude from Growth Chart --    No data found.  Updated Vital Signs BP (!) 168/93 (BP Location: Left Arm)   Pulse 68   Temp 99 F (37.2 C) (Oral)   Resp 17   Ht 5\' 1"  (1.549 m)   Wt 154 lb (69.9 kg)   LMP 12/24/2022 (Exact Date)   BMI 29.10 kg/m   Physical Exam Vitals and nursing note reviewed.  Constitutional:      General: She is not in acute distress.    Appearance: Normal appearance.     Comments: Appears uncomfortable, fatigued  Cardiovascular:     Rate and Rhythm: Normal rate and regular rhythm.      Pulses: Normal pulses.  Pulmonary:     Effort: Pulmonary effort is normal.  Neurological:     Mental Status: She is alert and oriented to person, place, and time.    UC Treatments /  Results  Labs (all labs ordered are listed, but only abnormal results are displayed) Labs Reviewed - No data to display  EKG  Radiology No results found.  Procedures Procedures   Medications Ordered in UC Medications - No data to display  Initial Impression / Assessment and Plan / UC Course  I have reviewed the triage vital signs and the nursing notes.  Pertinent labs & imaging results that were available during my care of the patient were reviewed by me and considered in my medical decision making (see chart for details).  Stable vitals at this time Offered hemoccult stool in clinic, and discussed limited urgent care resources with patient. Given her symptoms and anemia history I would recommend evaluation in the emergency department, especially for stat blood work. Patient is agreeable to plan and would like to be seen at hospital. Discharged to ED with partner who is comfortable transporting her  Final Clinical Impressions(s) / UC Diagnoses   Final diagnoses:  Dark stools  Weakness   Discharge Instructions   None    ED Prescriptions   None    PDMP not reviewed this encounter.   Marlow Baars, [place] 01/17/23 1610

## 2023-01-20 ENCOUNTER — Telehealth: Payer: Self-pay | Admitting: Family

## 2023-01-20 NOTE — Telephone Encounter (Unsigned)
Copied from CRM [id number]. Topic: General - Other >> Jan 19, 2023  4:43 PM Marlow Baars wrote: Reason for CRM: Dorothy with [facility] called in to make sure the patient is a patient at this location as the wrong office is being notified each time the patient goes to the Urgent Care or ER. She states the wrong office is listed on the patients [facility] Card. It states [facility] on [facility]. She states there should be a form in the office that can be filled out and sent into medicaid letting them know that she is a patient at [facility]. Please assist further

## 2023-01-21 NOTE — Telephone Encounter (Signed)
FYI : Your established patient needs her PCP changed to you on her [employer].  I have located the PCP change form on the [facility] website and have completed it on her  behalf. Before I can fax it to [facility], I will need her  to come and sign the form as the Responsible Party. Once you've signed the provider section, and I will be able to fax it to [facility] for processing.  Thank you,  Anjelica

## 2023-01-22 NOTE — Telephone Encounter (Signed)
Noted  

## 2023-02-03 ENCOUNTER — Other Ambulatory Visit: Payer: Self-pay | Admitting: Family

## 2023-02-03 DIAGNOSIS — M79605 Pain in left leg: Secondary | ICD-10-CM

## 2023-02-03 DIAGNOSIS — W540XXS Bitten by dog, sequela: Secondary | ICD-10-CM

## 2023-02-03 NOTE — Telephone Encounter (Signed)
Complete

## 2023-02-04 ENCOUNTER — Encounter: Payer: Self-pay | Admitting: Family

## 2023-02-05 ENCOUNTER — Other Ambulatory Visit: Payer: Self-pay | Admitting: Internal Medicine

## 2023-02-05 DIAGNOSIS — D72819 Decreased white blood cell count, unspecified: Secondary | ICD-10-CM

## 2023-02-05 NOTE — Telephone Encounter (Signed)
-   Reports to Emergency Department/Urgent Care/call 911 for immediate medical evaluation.  - Patient referred to Orthopedic Surgery and Occupational Therapy on 01/06/2023. Please keep all scheduled appointments.  - Patient referred to Neurology and Physical Medicine on 01/11/2023. Please keep all scheduled appointments.  - Patient referred to Physical Therapy on 02/03/2023. Please keep all scheduled appointments.

## 2023-02-06 ENCOUNTER — Inpatient Hospital Stay (HOSPITAL_BASED_OUTPATIENT_CLINIC_OR_DEPARTMENT_OTHER): Payer: BC Managed Care – PPO | Admitting: Internal Medicine

## 2023-02-06 ENCOUNTER — Inpatient Hospital Stay: Payer: BC Managed Care – PPO | Attending: Internal Medicine

## 2023-02-06 VITALS — BP 126/86 | HR 67 | Temp 98.1°F | Resp 20 | Wt 158.1 lb

## 2023-02-06 DIAGNOSIS — D72819 Decreased white blood cell count, unspecified: Secondary | ICD-10-CM | POA: Insufficient documentation

## 2023-02-06 DIAGNOSIS — J45909 Unspecified asthma, uncomplicated: Secondary | ICD-10-CM | POA: Insufficient documentation

## 2023-02-06 DIAGNOSIS — D500 Iron deficiency anemia secondary to blood loss (chronic): Secondary | ICD-10-CM | POA: Diagnosis not present

## 2023-02-06 DIAGNOSIS — I10 Essential (primary) hypertension: Secondary | ICD-10-CM | POA: Insufficient documentation

## 2023-02-06 DIAGNOSIS — D509 Iron deficiency anemia, unspecified: Secondary | ICD-10-CM | POA: Insufficient documentation

## 2023-02-06 LAB — CBC WITH DIFFERENTIAL (CANCER CENTER ONLY)
Abs Immature Granulocytes: 0.01 10*3/uL (ref 0.00–0.07)
Basophils Absolute: 0 10*3/uL (ref 0.0–0.1)
Basophils Relative: 1 %
Eosinophils Absolute: 0.1 10*3/uL (ref 0.0–0.5)
Eosinophils Relative: 3 %
HCT: 34.9 % — ABNORMAL LOW (ref 36.0–46.0)
Hemoglobin: 11.5 g/dL — ABNORMAL LOW (ref 12.0–15.0)
Immature Granulocytes: 0 %
Lymphocytes Relative: 31 %
Lymphs Abs: 1 10*3/uL (ref 0.7–4.0)
MCH: 25.4 pg — ABNORMAL LOW (ref 26.0–34.0)
MCHC: 33 g/dL (ref 30.0–36.0)
MCV: 77 fL — ABNORMAL LOW (ref 80.0–100.0)
Monocytes Absolute: 0.3 10*3/uL (ref 0.1–1.0)
Monocytes Relative: 9 %
Neutro Abs: 1.7 10*3/uL (ref 1.7–7.7)
Neutrophils Relative %: 56 %
Platelet Count: 217 10*3/uL (ref 150–400)
RBC: 4.53 MIL/uL (ref 3.87–5.11)
RDW: 15 % (ref 11.5–15.5)
WBC Count: 3.1 10*3/uL — ABNORMAL LOW (ref 4.0–10.5)
nRBC: 0 % (ref 0.0–0.2)

## 2023-02-06 LAB — IRON AND IRON BINDING CAPACITY (CC-WL,HP ONLY)
Iron: 31 ug/dL (ref 28–170)
Saturation Ratios: 7 % — ABNORMAL LOW (ref 10.4–31.8)
TIBC: 445 ug/dL (ref 250–450)
UIBC: 414 ug/dL (ref 148–442)

## 2023-02-06 LAB — CMP (CANCER CENTER ONLY)
ALT: 12 U/L (ref 0–44)
AST: 17 U/L (ref 15–41)
Albumin: 3.9 g/dL (ref 3.5–5.0)
Alkaline Phosphatase: 69 U/L (ref 38–126)
Anion gap: 5 (ref 5–15)
BUN: 11 mg/dL (ref 6–20)
CO2: 25 mmol/L (ref 22–32)
Calcium: 8.6 mg/dL — ABNORMAL LOW (ref 8.9–10.3)
Chloride: 106 mmol/L (ref 98–111)
Creatinine: 0.89 mg/dL (ref 0.44–1.00)
GFR, Estimated: >60 mL/min (ref >60)
Glucose, Bld: 96 mg/dL (ref 70–99)
Potassium: 3.7 mmol/L (ref 3.5–5.1)
Sodium: 136 mmol/L (ref 135–145)
Total Bilirubin: 0.3 mg/dL (ref <1.2)
Total Protein: 7 g/dL (ref 6.5–8.1)

## 2023-02-06 LAB — FERRITIN: Ferritin: 10 ng/mL — ABNORMAL LOW (ref 11–307)

## 2023-02-06 LAB — FOLATE: Folate: 5.6 ng/mL — ABNORMAL LOW (ref >5.9)

## 2023-02-06 LAB — LACTATE DEHYDROGENASE: LDH: 127 U/L (ref 98–192)

## 2023-02-06 LAB — VITAMIN B12: Vitamin B-12: 245 pg/mL (ref 180–914)

## 2023-02-06 LAB — TSH: TSH: 0.624 u[IU]/mL (ref 0.350–4.500)

## 2023-02-06 NOTE — Progress Notes (Signed)
[facility] [phone]   [phone]  CONSULT NOTE  REFERRING PHYSICIAN: Ricky Stabs, NP  REASON FOR CONSULTATION:  42 years old African-American female with leukocytopenia  HPI Amy Guerrero is a 42 y.o. female with past medical history significant for hypertension, asthma, sinusitis and tubal ligation.  The patient was seen recently by her primary care provider and repeat blood work on January 06, 2023 showed decreased total white blood count of 2.7 with normal hemoglobin of 12.7 and hematocrit 41.9% and platelets count of 263,000.  She had a history of leukocytopenia that has been going on for several years.  For example on February 27, 2009 her total white blood count was 3.4.  On Jun 16, 2017 her total white blood count was 2.8.  On April 17, 2018 her total white blood count was 2.7.  She had HIV test on August 23, 2022 that was nonreactive.  Her previous iron study, ferritin, vitamin B12 and folate on August 22, 2022 were normal.  She was referred to me today for evaluation and recommendation regarding her persistent leukocytopenia. Discussed the use of AI scribe software for clinical note transcription with the patient, who gave verbal consent to proceed.  History of Present Illness   Amy Guerrero, a 42 year old individual, presents with a longstanding history of low white blood cell count, which has been noted for several years. The patient was referred by her primary care provider due to this persistent hematological abnormality. She reports no new symptoms at the time of the referral, but does mention a chronic feeling of coldness and increased weakness.  The patient's low white blood cell count has been consistently observed for approximately 11 years, with counts typically ranging between 2.5 to 3.5. The patient recalls first becoming aware of her hematological issues around the time she started having children, which was approximately 28 years ago.  However, she is unsure if the low white blood cell count was present at that time.  In addition to the low white blood cell count, the patient also has a history of anemia, which she manages with intermittent iron supplementation, taken approximately three times a week when remembered. She reports no adverse effects from the iron supplementation.  The patient also reports occasional chest pain, but denies any bleeding, recurrent infections, or other significant symptoms. She has a medical history of high blood pressure and asthma, but denies any history of diabetes, heart attacks, or strokes.  The patient's family history is significant for diabetes in both parents, a heart attack in her father, and breast cancer in her mother. 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 She admits to smoking marijuana but denies cigarette use or regular alcohol consumption.  The patient's medication history includes Flexeril, gabapentin, and naproxen, which were prescribed following a dog attack in May. However, the patient reports not currently taking these medications. She also has a known allergy to vancomycin.  The patient's recent blood work shows a total white blood cell count of 3.1, consistent with her historical values. She also has mild anemia with microcytic red blood cells, and a stable platelet count.      HPI  Past Medical History:  Diagnosis Date   Asthma    Sinusitis     Past Surgical History:  Procedure Laterality Date   CARPAL TUNNEL RELEASE Right    TUBAL LIGATION     tubes tied      Family History  Problem Relation Age of Onset   Diabetes Mother    Diabetes Father     Social History Social History   Tobacco Use   Smoking status: Never   Smokeless tobacco: Never  Vaping Use   Vaping status: Former  Substance Use Topics   Alcohol use: Yes    Alcohol/week: 1.0 standard drink of alcohol    Types: 1 Glasses of wine per week    Comment: occassionally    Drug use: Yes    Types: Marijuana    Allergies  Allergen Reactions   Vancomycin Itching and Rash    Current Outpatient Medications  Medication Sig Dispense Refill   albuterol (PROVENTIL) (2.5 MG/3ML) 0.083% nebulizer solution Take 3 mLs (2.5 mg total) by nebulization every 6 (six) hours as needed for wheezing or shortness of breath. 150 mL 2   albuterol (VENTOLIN HFA) 108 (90 Base) MCG/ACT inhaler Inhale 2 puffs into the lungs every 6 (six) hours as needed for wheezing or shortness of breath. 8 g 2   amoxicillin-clavulanate (AUGMENTIN) 500-125 MG tablet Take 1 tablet by mouth 3 (three) times daily. (Patient not taking: Reported on 01/06/2023)     amoxicillin-clavulanate (AUGMENTIN) 875-125 MG tablet Take 1 tablet by mouth every 12 (twelve) hours. (Patient not taking: Reported on 01/06/2023) 14 tablet 0   amoxicillin-clavulanate (AUGMENTIN) 875-125 MG tablet Take 1 tablet by mouth every 12 (twelve) hours. (Patient not taking: Reported on 01/06/2023)     Cetirizine HCl 10 MG CAPS Take 1 capsule (10 mg total) by mouth daily. 90 capsule 0   cyclobenzaprine (FLEXERIL) 5 MG tablet Take 1 tablet (5 mg total) by mouth 2 (two) times daily as needed for muscle spasms. (Patient not taking: Reported on 07/31/2022) 20 tablet 0   ferrous gluconate (FERGON) 324 MG tablet Take 1 tablet (324 mg total) by mouth daily with breakfast. 30 tablet 3   gabapentin (NEURONTIN) 300 MG capsule Take 1 capsule (300 mg total) by mouth 3 (three) times daily. 90 capsule 0   mupirocin ointment (BACTROBAN) 2 % Apply 1 Application topically 2 (two) times daily. 22 g 0   naproxen (NAPROSYN) 375 MG tablet Take 1 tablet (375 mg total) by mouth 2 (two) times daily with a meal. 14 tablet 0   ondansetron (ZOFRAN-ODT) 8 MG disintegrating tablet Take 1 tablet (8 mg total) by mouth every 8 (eight) hours as needed for nausea. (Patient not taking: Reported on 07/31/2022) 10 tablet 0   oxyCODONE (OXY IR/ROXICODONE) 5 MG immediate release  tablet Take 1 tablet (5 mg total) by mouth every 6 (six) hours as needed for moderate pain. (Patient not taking: Reported on 01/06/2023) 15 tablet 0   predniSONE (DELTASONE) 20 MG tablet TAKE 2 TABLETS BY MOUTH EVERY DAY FOR 5 DAYS     Spacer/Aero-Holding Chambers (AEROCHAMBER PLUS) inhaler Use as instructed (Patient not taking: Reported on 07/31/2022) 1 each 2   No current facility-administered medications for this visit.    Review of Systems  Constitutional: positive for fatigue Eyes: negative Ears, nose, mouth, throat, and face: negative Respiratory: negative Cardiovascular: negative Gastrointestinal: negative Genitourinary:negative Integument/breast: negative Hematologic/lymphatic: negative Musculoskeletal:negative Neurological: negative Behavioral/Psych: negative Endocrine: negative Allergic/Immunologic: negative  Physical Exam  WUJ:WJXBJ, healthy, no distress, well nourished, and well developed SKIN: skin color, texture, turgor are normal, no rashes or significant lesions HEAD: Normocephalic, No masses, lesions, tenderness or abnormalities EYES: normal, PERRLA, Conjunctiva are pink and non-injected EARS: External ears normal, Canals clear OROPHARYNX:no exudate, no erythema, and lips, buccal mucosa, and  tongue normal  NECK: supple, no adenopathy, no JVD LYMPH:  no palpable lymphadenopathy, no hepatosplenomegaly BREAST:not examined LUNGS: clear to auscultation , and palpation HEART: regular rate & rhythm, no murmurs, and no gallops ABDOMEN:abdomen soft, non-tender, normal bowel sounds, and no masses or organomegaly BACK: Back symmetric, no curvature., No CVA tenderness EXTREMITIES:no joint deformities, effusion, or inflammation, no edema  NEURO: alert & oriented x 3 with fluent speech, no focal motor/sensory deficits  PERFORMANCE STATUS: ECOG 1  LABORATORY DATA: Lab Results  Component Value Date   WBC 3.3 (L) 01/17/2023   HGB 12.8 01/17/2023   HCT 39.3 01/17/2023    MCV 77.2 (L) 01/17/2023   PLT 201 01/17/2023      Chemistry      Component Value Date/Time   NA 135 01/17/2023 1614   NA 137 01/06/2023 1111   K 3.5 01/17/2023 1614   CL 106 01/17/2023 1614   CO2 21 (L) 01/17/2023 1614   BUN 12 01/17/2023 1614   BUN 12 01/06/2023 1111   CREATININE 0.98 01/17/2023 1614      Component Value Date/Time   CALCIUM 8.5 (L) 01/17/2023 1614   ALKPHOS 67 01/17/2023 1614   AST 20 01/17/2023 1614   ALT 17 01/17/2023 1614   BILITOT 0.5 01/17/2023 1614   BILITOT 0.6 01/06/2023 1111       RADIOGRAPHIC STUDIES: DG Chest 1 View Result Date: 01/17/2023 CLINICAL DATA:  weakness EXAM: CHEST  1 VIEW COMPARISON:  Chest x-ray 02/10/2021 FINDINGS: The heart and mediastinal contours are within normal limits. No focal consolidation. No pulmonary edema. No pleural effusion. No pneumothorax. No acute osseous abnormality. IMPRESSION: No active disease. Electronically Signed   By: Tish Frederickson M.D.   On: 01/17/2023 18:03    ASSESSMENT AND PLAN:    Chronic Leukopenia Chronic leukopenia with WBC consistently between 2.5-3.5 for over a decade. Differential includes drug-induced leukopenia, autoimmune disorder, or benign ethnic neutropenia. No recurrent infections or significant symptoms of severe immunodeficiency. Long-standing nature suggests less severe etiology. - Order bone marrow biopsy after Christmas to rule out underlying pathology - Repeat blood work: hepatitis panel, rheumatoid factor, ANA, serum protein electrophoresis  Microcytic Anemia Mild microcytic anemia with hemoglobin 11.5, hematocrit 34.9, and MCV 77. Likely secondary to iron deficiency, exacerbated by menstrual blood loss and inconsistent iron supplementation. Consistent iron supplementation can improve anemia and reduce fatigue. - Advise iron supplements every other day with vitamin C or orange juice to enhance absorption - Educate on the importance of consistent iron  supplementation  Hypertension Hypertension with fluctuating blood pressure readings. No current medication regimen discussed.  Asthma Asthma with no current exacerbations or specific management plan discussed.  Follow-up - Schedule follow-up appointment in one month to review results.   The patient was advised to call immediately if she has any other concerning symptoms in the interval. The patient voices understanding of current disease status and treatment options and is in agreement with the current care plan.  All questions were answered. The patient knows to call the clinic with any problems, questions or concerns. We can certainly see the patient much sooner if necessary.  Thank you so much for allowing me to participate in the care of Amy Guerrero. I will continue to follow up the patient with you and assist in her care. The total time spent in the appointment was 60 minutes.  Disclaimer: This note was dictated with voice recognition software. Similar sounding words can inadvertently be transcribed and may not be corrected  upon review.   Lajuana Matte February 06, 2023, 9:40 AM

## 2023-02-08 ENCOUNTER — Other Ambulatory Visit: Payer: Self-pay | Admitting: Family

## 2023-02-08 DIAGNOSIS — J4520 Mild intermittent asthma, uncomplicated: Secondary | ICD-10-CM

## 2023-02-08 MED ORDER — ALBUTEROL SULFATE HFA 108 (90 BASE) MCG/ACT IN AERS: 2.0000 | INHALATION_SPRAY | Freq: Four times a day (QID) | RESPIRATORY_TRACT | 2 refills | Status: AC | PRN

## 2023-02-08 NOTE — Telephone Encounter (Signed)
Amy Guerrero ok to order asthma inhaler? Patient is also concern about her labs from Dr. Sofie Hartigan and the fact that she is anemic. I did Advise to reach out to that office for clarification.

## 2023-02-08 NOTE — Telephone Encounter (Signed)
-   Albuterol inhaler prescribed.  - Ordering provider of anemia labs [facility]) to advise patient on follow-up.

## 2023-02-11 ENCOUNTER — Other Ambulatory Visit: Payer: Self-pay | Admitting: Radiology

## 2023-02-11 DIAGNOSIS — D72819 Decreased white blood cell count, unspecified: Secondary | ICD-10-CM

## 2023-02-15 ENCOUNTER — Other Ambulatory Visit: Payer: Self-pay | Admitting: Radiology

## 2023-02-15 DIAGNOSIS — D708 Other neutropenia: Secondary | ICD-10-CM

## 2023-02-15 NOTE — Consult Note (Signed)
 Chief Complaint: Patient was seen in consultation today for CT-guided bone marrow biopsy  Referring Physician(s): Mohamed,Mohamed  Supervising Physician: Luverne Aran  Patient Status: [facility] - Out-pt  History of Present Illness: Amy Guerrero is a 42 y.o. female with past medical history significant for hypertension, asthma, sinusitis, tubal ligation as well as microcytic anemia and chronic leukopenia of uncertain etiology. She presents today for CT-guided bone marrow biopsy for further evaluation.   Past Medical History:  Diagnosis Date   Asthma    Sinusitis     Past Surgical History:  Procedure Laterality Date   CARPAL TUNNEL RELEASE Right    TUBAL LIGATION     tubes tied      Allergies: Vancomycin   Medications: Prior to Admission medications   Medication Sig Start Date End Date Taking? Authorizing Provider  albuterol  (PROVENTIL ) (2.5 MG/3ML) 0.083% nebulizer solution Take 3 mLs (2.5 mg total) by nebulization every 6 (six) hours as needed for wheezing or shortness of breath. 07/31/22   Lorren Greig PARAS, NP  albuterol  (VENTOLIN  HFA) 108 (90 Base) MCG/ACT inhaler Inhale 2 puffs into the lungs every 6 (six) hours as needed for wheezing or shortness of breath. 02/08/23   Lorren Greig PARAS, NP  amoxicillin -clavulanate (AUGMENTIN ) 500-125 MG tablet Take 1 tablet by mouth 3 (three) times daily. Patient not taking: Reported on 01/06/2023    [provider]  amoxicillin -clavulanate (AUGMENTIN ) 875-125 MG tablet Take 1 tablet by mouth every 12 (twelve) hours. Patient not taking: Reported on 01/06/2023 11/03/22   Mound, Haley E, FNP  amoxicillin -clavulanate (AUGMENTIN ) 875-125 MG tablet Take 1 tablet by mouth every 12 (twelve) hours. Patient not taking: Reported on 01/06/2023    [provider]  Cetirizine  HCl 10 MG CAPS Take 1 capsule (10 mg total) by mouth daily. 12/26/21 03/26/22  Lorren Greig PARAS, NP  cyclobenzaprine  (FLEXERIL ) 5 MG tablet Take 1 tablet (5 mg  total) by mouth 2 (two) times daily as needed for muscle spasms. Patient not taking: Reported on 07/31/2022 10/02/21   Hazen Darryle BRAVO, FNP  ferrous gluconate  (FERGON) 324 MG tablet Take 1 tablet (324 mg total) by mouth daily with breakfast. 08/23/22   Drusilla Sabas RAMAN, MD  gabapentin  (NEURONTIN ) 300 MG capsule Take 1 capsule (300 mg total) by mouth 3 (three) times daily. 09/08/22   Lorren Greig PARAS, NP  mupirocin  ointment (BACTROBAN ) 2 % Apply 1 Application topically 2 (two) times daily. Patient not taking: Reported on 02/06/2023 11/03/22   Hazen Darryle BRAVO, FNP  naproxen  (NAPROSYN ) 375 MG tablet Take 1 tablet (375 mg total) by mouth 2 (two) times daily with a meal. Patient not taking: Reported on 02/06/2023 12/23/22   Palumbo, April, MD  ondansetron  (ZOFRAN -ODT) 8 MG disintegrating tablet Take 1 tablet (8 mg total) by mouth every 8 (eight) hours as needed for nausea. Patient not taking: Reported on 07/31/2022 02/10/21   Charlyn Sora, MD  oxyCODONE  (OXY IR/ROXICODONE ) 5 MG immediate release tablet Take 1 tablet (5 mg total) by mouth every 6 (six) hours as needed for moderate pain. Patient not taking: Reported on 01/06/2023 08/23/22   Drusilla Sabas RAMAN, MD  predniSONE  (DELTASONE ) 20 MG tablet TAKE 2 TABLETS BY MOUTH EVERY DAY FOR 5 DAYS Patient not taking: Reported on 02/06/2023    [provider]  Spacer/Aero-Holding Chambers (AEROCHAMBER PLUS) inhaler Use as instructed Patient not taking: Reported on 07/31/2022 05/07/20   Lorren Greig PARAS, NP  dicyclomine  (BENTYL ) 20 MG tablet Take 1 tablet (20 mg total) by mouth  4 (four) times daily -  before meals and at bedtime. Patient not taking: Reported on 07/17/2020 06/18/20 07/22/20  Wieters, Hallie C, PA-C  famotidine  (PEPCID ) 40 MG tablet Take 1 tablet (40 mg total) by mouth daily. Patient not taking: Reported on 07/17/2020 06/18/20 07/22/20  Wieters, Hallie C, PA-C  lisinopril  (ZESTRIL ) 5 MG tablet Take 1 tablet (5 mg total) by mouth daily. Patient not taking: Reported on  07/17/2020 05/07/20 07/22/20  Lorren Greig PARAS, NP  sertraline  (ZOLOFT ) 25 MG tablet Take 1 tablet (25 mg total) by mouth at bedtime. Patient not taking: Reported on 07/17/2020 05/07/20 07/22/20  Lorren Greig PARAS, NP     Family History  Problem Relation Age of Onset   Diabetes Mother    Diabetes Father     Social History   Socioeconomic History   Marital status: Single    Spouse name: Not on file   Number of children: Not on file   Years of education: Not on file   Highest education level: Not on file  Occupational History   Not on file  Tobacco Use   Smoking status: Never   Smokeless tobacco: Never  Vaping Use   Vaping status: Former  Substance and Sexual Activity   Alcohol use: Yes    Alcohol/week: 1.0 standard drink of alcohol    Types: 1 Glasses of wine per week    Comment: occassionally   Drug use: Yes    Types: Marijuana   Sexual activity: Not on file  Other Topics Concern   Not on file  Social History Narrative   Not on file   Social Drivers of Health   Financial Resource Strain: Medium Risk (08/10/2022)   Overall Financial Resource Strain (CARDIA)    Difficulty of Paying Living Expenses: Somewhat hard  Food Insecurity: No Food Insecurity (02/06/2023)   Hunger Vital Sign    Worried About Running Out of Food in the Last Year: Never true    Ran Out of Food in the Last Year: Never true  Transportation Needs: No Transportation Needs (02/06/2023)   PRAPARE - Administrator, Civil Service (Medical): No    Lack of Transportation (Non-Medical): No  Physical Activity: Unknown (01/06/2023)   Exercise Vital Sign    Days of Exercise per Week: 5 days    Minutes of Exercise per Session: Not on file  Stress: Stress Concern Present (01/06/2023)   [employer] of Occupational Health - Occupational Stress Questionnaire    Feeling of Stress : Very much  Social Connections: Socially Isolated (01/06/2023)   Social Connection and Isolation Panel [NHANES]    Frequency  of Communication with Friends and Family: More than three times a week    Frequency of Social Gatherings with Friends and Family: More than three times a week    Attends Religious Services: Never    Database Administrator or Organizations: No    Attends Engineer, Structural: Not on file    Marital Status: Never married     Review of Systems denies fever, HA,CP,dyspnea, cough, abd pain,N/V or bleeding; she does have back pain   Code Status: FULL CODE  Vital Signs: Vitals:   02/16/23 0713  BP: 126/82  Pulse: 76  Resp: 16  Temp: 99.1 F (37.3 C)  SpO2: 98%    LMP 12/24/2022 (Exact Date)     Physical Exam: awake/alert; chest- few scatt wheezes; heart- RRR; abd-soft,+BS,NT; no LE edema  Imaging: DG Chest 1 View Result Date: 01/17/2023  CLINICAL DATA:  weakness EXAM: CHEST  1 VIEW COMPARISON:  Chest x-ray 02/10/2021 FINDINGS: The heart and mediastinal contours are within normal limits. No focal consolidation. No pulmonary edema. No pleural effusion. No pneumothorax. No acute osseous abnormality. IMPRESSION: No active disease. Electronically Signed   By: Morgane  Naveau M.D.   On: 01/17/2023 18:03    Labs:  CBC: Recent Labs    08/21/22 2350 01/06/23 1111 01/17/23 1614 02/06/23 1007  WBC 3.2* 2.7* 3.3* 3.1*  HGB 9.8* 12.7 12.8 11.5*  HCT 32.1* 41.9 39.3 34.9*  PLT 217 263 201 217    COAGS: No results for input(s): INR, APTT in the last 8760 hours.  BMP: Recent Labs    08/21/22 2153 08/21/22 2350 01/06/23 1111 01/17/23 1614 02/06/23 1007  NA 136  --  137 135 136  K 3.6  --  3.9 3.5 3.7  CL 105  --  104 106 106  CO2 23  --  22 21* 25  GLUCOSE 88  --  88 90 96  BUN 17  --  12 12 11   CALCIUM 8.1*  --  9.4 8.5* 8.6*  CREATININE 0.98 1.02* 0.94 0.98 0.89  GFRNONAA >60 >60  --  >60 >60    LIVER FUNCTION TESTS: Recent Labs    01/06/23 1111 01/17/23 1614 02/06/23 1007  BILITOT 0.6 0.5 0.3  AST 18 20 17   ALT 12 17 12   ALKPHOS 79 67 69  PROT  7.6 7.5 7.0  ALBUMIN 4.3 3.8 3.9    TUMOR MARKERS: No results for input(s): AFPTM, CEA, CA199, CHROMGRNA in the last 8760 hours.  Assessment and Plan: 42 y.o. female with past medical history significant for hypertension, asthma, sinusitis, tubal ligation as well as microcytic anemia and chronic leukopenia of uncertain etiology. She presents today for CT-guided bone marrow biopsy for further evaluation. Risks and benefits of procedure was discussed with the patient  including, but not limited to bleeding, infection, damage to adjacent structures or low yield requiring additional tests.  All of the questions were answered and there is agreement to proceed.  Consent signed and in chart.    Thank you for this interesting consult.  I greatly enjoyed meeting Amy Guerrero and look forward to participating in their care.  A copy of this report was sent to the requesting provider on this date.  Electronically Signed: D. Franky Rakers, PA-C 02/15/2023, 1:16 PM   I spent a total of   20 minutes  in face to face in clinical consultation, greater than 50% of which was counseling/coordinating care for CT-guided bone marrow biopsy

## 2023-02-16 ENCOUNTER — Encounter (HOSPITAL_COMMUNITY): Payer: Self-pay

## 2023-02-16 ENCOUNTER — Other Ambulatory Visit: Payer: Self-pay

## 2023-02-16 ENCOUNTER — Ambulatory Visit (HOSPITAL_COMMUNITY)
Admission: RE | Admit: 2023-02-16 | Discharge: 2023-02-16 | Disposition: A | Discharge status: Home / Self Care | Payer: BC Managed Care – PPO | Source: Ambulatory Visit | Attending: Internal Medicine | Admitting: Internal Medicine

## 2023-02-16 DIAGNOSIS — D72819 Decreased white blood cell count, unspecified: Secondary | ICD-10-CM | POA: Diagnosis present

## 2023-02-16 DIAGNOSIS — D500 Iron deficiency anemia secondary to blood loss (chronic): Secondary | ICD-10-CM

## 2023-02-16 DIAGNOSIS — D708 Other neutropenia: Secondary | ICD-10-CM

## 2023-02-16 LAB — CBC WITH DIFFERENTIAL/PLATELET
Abs Immature Granulocytes: 0 10*3/uL (ref 0.00–0.07)
Basophils Absolute: 0 10*3/uL (ref 0.0–0.1)
Basophils Relative: 1 %
Eosinophils Absolute: 0 10*3/uL (ref 0.0–0.5)
Eosinophils Relative: 1 %
HCT: 36.6 % (ref 36.0–46.0)
Hemoglobin: 11.9 g/dL — ABNORMAL LOW (ref 12.0–15.0)
Immature Granulocytes: 0 %
Lymphocytes Relative: 29 %
Lymphs Abs: 0.9 10*3/uL (ref 0.7–4.0)
MCH: 25.8 pg — ABNORMAL LOW (ref 26.0–34.0)
MCHC: 32.5 g/dL (ref 30.0–36.0)
MCV: 79.4 fL — ABNORMAL LOW (ref 80.0–100.0)
Monocytes Absolute: 0.3 10*3/uL (ref 0.1–1.0)
Monocytes Relative: 10 %
Neutro Abs: 1.7 10*3/uL (ref 1.7–7.7)
Neutrophils Relative %: 59 %
Platelets: 194 10*3/uL (ref 150–400)
RBC: 4.61 MIL/uL (ref 3.87–5.11)
RDW: 15.2 % (ref 11.5–15.5)
WBC: 2.9 10*3/uL — ABNORMAL LOW (ref 4.0–10.5)
nRBC: 0 % (ref 0.0–0.2)

## 2023-02-16 MED ORDER — NALOXONE HCL 0.4 MG/ML IJ SOLN
INTRAMUSCULAR | Status: AC
  Filled 2023-02-16: qty 1

## 2023-02-16 MED ORDER — SODIUM CHLORIDE 0.9% FLUSH: 3.0000 mL | INTRAVENOUS | Status: DC | PRN

## 2023-02-16 MED ORDER — SODIUM CHLORIDE 0.9 % IV SOLN: INTRAVENOUS | Status: DC

## 2023-02-16 MED ORDER — FENTANYL CITRATE (PF) 100 MCG/2ML IJ SOLN
INTRAMUSCULAR | Status: AC | PRN
  Administered 2023-02-16: 50 ug via INTRAVENOUS
  Administered 2023-02-16: 25 ug via INTRAVENOUS
  Administered 2023-02-16: 50 ug via INTRAVENOUS
  Administered 2023-02-16: 25 ug via INTRAVENOUS

## 2023-02-16 MED ORDER — FLUMAZENIL 0.5 MG/5ML IV SOLN
INTRAVENOUS | Status: AC
  Filled 2023-02-16: qty 5

## 2023-02-16 MED ORDER — SODIUM CHLORIDE 0.9% FLUSH: 3.0000 mL | Freq: Two times a day (BID) | INTRAVENOUS | Status: DC

## 2023-02-16 MED ORDER — FENTANYL CITRATE (PF) 100 MCG/2ML IJ SOLN
INTRAMUSCULAR | Status: AC
  Filled 2023-02-16: qty 2

## 2023-02-16 MED ORDER — MIDAZOLAM HCL 2 MG/2ML IJ SOLN
INTRAMUSCULAR | Status: AC | PRN
  Administered 2023-02-16: 1 mg via INTRAVENOUS
  Administered 2023-02-16: .5 mg via INTRAVENOUS
  Administered 2023-02-16: 1 mg via INTRAVENOUS
  Administered 2023-02-16: .5 mg via INTRAVENOUS

## 2023-02-16 MED ORDER — DIPHENHYDRAMINE HCL 50 MG/ML IJ SOLN
INTRAMUSCULAR | Status: AC
Start: 2023-02-16 — End: ?
  Filled 2023-02-16: qty 1

## 2023-02-16 MED ORDER — MIDAZOLAM HCL 2 MG/2ML IJ SOLN
INTRAMUSCULAR | Status: AC
  Filled 2023-02-16: qty 2

## 2023-02-16 MED ORDER — MIDAZOLAM HCL 2 MG/2ML IJ SOLN
INTRAMUSCULAR | Status: AC
Start: 2023-02-16 — End: ?
  Filled 2023-02-16: qty 2

## 2023-02-16 NOTE — Progress Notes (Signed)
Bandaid to lower back biopsy site- small amt of drainage / blood tinged.   Changed to pressure dsg to site.  Pt. States tenderness / sore at site.  Refused cold pack to site.  Provided with IR Discharge instructions with understanding.

## 2023-02-16 NOTE — Procedures (Signed)
 Interventional Radiology Procedure Note  Procedure: CT guided bone marrow aspiration and biopsy  Complications: None  EBL: < 10 mL  Findings: Aspirate and core biopsy performed of bone marrow in right iliac bone.  Plan: Bedrest supine x 1 hrs  Ericha Whittingham T. Fredia Sorrow, M.D Pager:  [phone]

## 2023-02-16 NOTE — Discharge Instructions (Signed)
 For questions /concerns may call Interventional Radiology at [phone] or  Interventional Radiology clinic [phone]   You may remove your dressing and shower tomorrow afternoon                                                                                 Bone Marrow Aspiration and Bone Marrow Biopsy, Adult, Care After The following information offers guidance on how to care for yourself after your procedure. Your health care provider may also give you more specific instructions. If you have problems or questions, contact your health care provider. What can I expect after the procedure? After the procedure, it is common to have: Mild pain and tenderness. Swelling. Bruising. Follow these instructions at home: Incision care  Follow instructions from your health care provider about how to take care of the incision site. Make sure you: Wash your hands with soap and water for at least 20 seconds before and after you change your bandage (dressing). If soap and water are not available, use hand sanitizer. Change your dressing as told by your health care provider. Leave stitches (sutures), skin glue, or adhesive strips in place. These skin closures may need to stay in place for 2 weeks or longer. If adhesive strip edges start to loosen and curl up, you may trim the loose edges. Do not remove adhesive strips completely unless your health care provider tells you to do that. Check your incision site every day for signs of infection. Check for: More redness, swelling, or pain. Fluid or blood. Warmth. Pus or a bad smell. Activity Return to your normal activities as told by your health care provider. Ask your health care provider what activities are safe for you. Do not lift anything that is heavier than 10 lb (4.5 kg), or the limit that you are told, until your health care provider says that it is safe. If you were given a sedative during the procedure, it can affect you for several hours. Do  not drive or operate machinery until your health care provider says that it is safe. General instructions  Take over-the-counter and prescription medicines only as told by your health care provider. Do not take baths, swim, or use a hot tub until your health care provider approves. Ask your health care provider if you may take showers. You may only be allowed to take sponge baths. If directed, put ice on the affected area. To do this: Put ice in a plastic bag. Place a towel between your skin and the bag. Leave the ice on for 20 minutes, 2-3 times a day. If your skin turns bright red, remove the ice right away to prevent skin damage. The risk of skin damage is higher if you cannot feel pain, heat, or cold. Contact a health care provider if: You have signs of infection. Your pain is not controlled with medicine. You have cancer, and a temperature of 100.88F (38C) or higher. Get help right away if: You have a temperature of 101F (38.3C) or higher, or as told by your health care provider. You have bleeding from the incision site that cannot be controlled. This information is not intended to replace advice given to you by  your health care provider. Make sure you discuss any questions you have with your health care provider. Document Revised: 06/09/2021 Document Reviewed: 06/09/2021 Elsevier Patient Education  2024 [employer]                                                                          Moderate Conscious Sedation, Adult, Care After After the procedure, it is common to have: Sleepiness for a few hours. Impaired judgment for a few hours. Trouble with balance. Nausea or vomiting if you eat too soon. Follow these instructions at home: For the time period you were told by your health care provider:  Rest. Do not participate in activities where you could fall or become injured. Do not drive or use machinery. Do not drink alcohol. Do not take sleeping pills or medicines that  cause drowsiness. Do not make important decisions or sign legal documents. Do not take care of children on your own. Eating and drinking Follow instructions from your health care provider about what you may eat and drink. Drink enough fluid to keep your urine pale yellow. If you vomit: Drink clear fluids slowly and in small amounts as you are able. Clear fluids include water, ice chips, low-calorie sports drinks, and fruit juice that has water added to it (diluted fruit juice). Eat light and bland foods in small amounts as you are able. These foods include bananas, applesauce, rice, lean meats, toast, and crackers. General instructions Take over-the-counter and prescription medicines only as told by your health care provider. Have a responsible adult stay with you for the time you are told. Do not use any products that contain nicotine or tobacco. These products include cigarettes, chewing tobacco, and vaping devices, such as e-cigarettes. If you need help quitting, ask your health care provider. Return to your normal activities as told by your health care provider. Ask your health care provider what activities are safe for you. Your health care provider may give you more instructions. Make sure you know what you can and cannot do. Contact a health care provider if: You are still sleepy or having trouble with balance after 24 hours. You feel light-headed. You vomit every time you eat or drink. You get a rash. You have a fever. You have redness or swelling around the IV site. Get help right away if: You have trouble breathing. You start to feel confused at home. These symptoms may be an emergency. Get help right away. Call 911. Do not wait to see if the symptoms will go away. Do not drive yourself to the hospital. This information is not intended to replace advice given to you by your health care provider. Make sure you discuss any questions you have with your health care  provider. Document Revised: 08/18/2021 Document Reviewed: 08/18/2021 Elsevier Patient Education  2024 [employer].

## 2023-02-19 LAB — SURGICAL PATHOLOGY

## 2023-02-19 LAB — MULTIPLE MYELOMA PANEL, SERUM
Albumin SerPl Elph-Mcnc: 3.6 g/dL (ref 2.9–4.4)
Albumin/Glob SerPl: 1.2 (ref 0.7–1.7)
Alpha 1: 0.2 g/dL (ref 0.0–0.4)
Alpha2 Glob SerPl Elph-Mcnc: 0.5 g/dL (ref 0.4–1.0)
B-Globulin SerPl Elph-Mcnc: 1 g/dL (ref 0.7–1.3)
Gamma Glob SerPl Elph-Mcnc: 1.6 g/dL (ref 0.4–1.8)
Globulin, Total: 3.2 g/dL (ref 2.2–3.9)
IgA: 276 mg/dL (ref 87–352)
IgG (Immunoglobin G), Serum: 1660 mg/dL — ABNORMAL HIGH (ref 586–1602)
IgM (Immunoglobulin M), Srm: 45 mg/dL (ref 26–217)
Total Protein ELP: 6.8 g/dL (ref 6.0–8.5)

## 2023-02-24 ENCOUNTER — Encounter (HOSPITAL_COMMUNITY): Payer: Self-pay | Admitting: Internal Medicine

## 2023-02-25 ENCOUNTER — Ambulatory Visit (INDEPENDENT_AMBULATORY_CARE_PROVIDER_SITE_OTHER): Payer: BC Managed Care – PPO | Admitting: Neurology

## 2023-02-25 DIAGNOSIS — R202 Paresthesia of skin: Secondary | ICD-10-CM

## 2023-02-25 DIAGNOSIS — G5790 Unspecified mononeuropathy of unspecified lower limb: Secondary | ICD-10-CM

## 2023-02-25 NOTE — Therapy (Deleted)
 OUTPATIENT PHYSICAL THERAPY LOWER EXTREMITY EVALUATION   Patient Name: Amy Guerrero MRN: [id number] DOB:09/16/1980, 43 y.o., female Today's Date: 02/25/2023  END OF SESSION:   Past Medical History:  Diagnosis Date   Asthma    Sinusitis    Past Surgical History:  Procedure Laterality Date   CARPAL TUNNEL RELEASE Right    TUBAL LIGATION     tubes tied     Patient Active Problem List   Diagnosis Date Noted   Lymphedema 08/22/2022   Nerve damage 08/22/2022   Dog bite 08/21/2022   Pain in finger of right hand 07/07/2022   Essential hypertension 05/07/2020   Anxiety and depression 05/07/2020    PCP: Lorren Greig PARAS, NP  REFERRING PROVIDER: Lorren Greig PARAS, NP  REFERRING DIAG: [phone] (ICD-10-CM) - Dog bite, sequela M79.605 (ICD-10-CM) - Left leg pain  THERAPY DIAG:  No diagnosis found.  Rationale for Evaluation and Treatment: Rehabilitation  ONSET DATE: 07/2022  SUBJECTIVE:   SUBJECTIVE STATEMENT: ***  PERTINENT HISTORY: HPI: The patient is a 43 year old woman who presents today in follow-up after a dog bite to the left lower leg on June 2.  Completed a course of Augmentin  initially after the injury went on to have subsequent cellulitis completed a second course of Augmentin  as well as doxycycline  continue to have swelling and pain difficulty bearing weight due to significant pain.   An MRI was concerning for infectious myositis did have another hospitalization was seen by ID.  Antibiotics were stopped.   PAIN:  Are you having pain? {OPRCPAIN:27236}  PRECAUTIONS: None  RED FLAGS: None   WEIGHT BEARING RESTRICTIONS: No  FALLS:  Has patient fallen in last 6 months? No   OCCUPATION: [employer]  PLOF: Independent  PATIENT GOALS: ***  NEXT MD VISIT: ***  OBJECTIVE:  Note: Objective measures were completed at Evaluation unless otherwise noted.  DIAGNOSTIC FINDINGS: History: This is a 43 year old female with history of dog bite to left lower referred  for paresthesias and pain.     NCV & EMG Findings: Extensive electrodiagnostic testing of the left lower extremity and additional studies of the right shows:  Bilateral sural sensory responses are absent.  Bilateral superficial peroneal sensory responses are within normal limits. Left tibial motor response shows reduced amplitude (L4.6 mV); these findings are of uncertain clinical significance in the setting of normal needle examination.  Left peroneal and right tibial motor responses are within normal limits. Needle electrode examination is somewhat hampered by poor activation, due to pain.  There is no active or chronic motor axonal loss changes affecting any of the tested muscles.   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  Patient has history of traumatic injury to both lower extremities which could potentially explain these findings.  Correlate clinically.    PATIENT SURVEYS:  FOTO ***  MUSCLE LENGTH: Hamstrings: Right *** deg; Left *** deg Debby test: Right *** deg; Left *** deg  PALPATION: ***  LOWER EXTREMITY ROM:  {AROM/PROM:27142} ROM Right eval Left eval  Hip flexion    Hip extension    Hip abduction    Hip adduction    Hip internal rotation    Hip external rotation    Knee flexion    Knee extension    Ankle dorsiflexion    Ankle plantarflexion    Ankle inversion    Ankle eversion     (Blank rows = not tested)  LOWER EXTREMITY MMT:  MMT Right eval  Left eval  Hip flexion    Hip extension    Hip abduction    Hip adduction    Hip internal rotation    Hip external rotation    Knee flexion    Knee extension    Ankle dorsiflexion    Ankle plantarflexion    Ankle inversion    Ankle eversion     (Blank rows = not tested)  LOWER EXTREMITY SPECIAL TESTS:  {LEspecialtests:26242}  FUNCTIONAL TESTS:  30 seconds chair stand test  GAIT: Distance walked:  *** Assistive device utilized: {Assistive devices:23999} Level of assistance: {Levels of assistance:24026} Comments: ***                                                                                                                                TREATMENT DATE: ***    PATIENT EDUCATION:  Education details: Discussed eval findings, rehab rationale and POC and patient is in agreement  Person educated: Patient Education method: Explanation Education comprehension: verbalized understanding and needs further education  HOME EXERCISE PROGRAM: ***  ASSESSMENT:  CLINICAL IMPRESSION: Patient is a *** y.o. *** who was seen today for physical therapy evaluation and treatment for ***.   OBJECTIVE IMPAIRMENTS: {opptimpairments:25111}.   ACTIVITY LIMITATIONS: {activitylimitations:27494}   PERSONAL FACTORS: {Personal factors:25162} are also affecting patient's functional outcome.   REHAB POTENTIAL: Fair based on Dx and underlying sural neuropathy  CLINICAL DECISION MAKING: Stable/uncomplicated  EVALUATION COMPLEXITY: Low   GOALS: Goals reviewed with patient? No  SHORT TERM GOALS: Target date: *** *** Baseline: Goal status: INITIAL  2.  *** Baseline:  Goal status: INITIAL  3.  *** Baseline:  Goal status: INITIAL  4.  *** Baseline:  Goal status: INITIAL  5.  *** Baseline:  Goal status: INITIAL  6.  *** Baseline:  Goal status: INITIAL  LONG TERM GOALS: Target date: ***  *** Baseline:  Goal status: INITIAL  2.  *** Baseline:  Goal status: INITIAL  3.  *** Baseline:  Goal status: INITIAL  4.  *** Baseline:  Goal status: INITIAL  5.  *** Baseline:  Goal status: INITIAL  6.  *** Baseline:  Goal status: INITIAL   PLAN:  PT FREQUENCY: 1-2x/week  PT DURATION: 4 weeks  PLANNED INTERVENTIONS: 97164- PT Re-evaluation, 97110-Therapeutic exercises, 97530- Therapeutic activity, 97112- Neuromuscular re-education, 97535- Self Care, 02859- Manual  therapy, 97116- Gait training, Balance training, and Dry Needling  PLAN FOR NEXT SESSION: HEP review and update, manual techniques as appropriate, aerobic tasks, ROM and flexibility activities, strengthening and PREs, TPDN, gait and balance training as needed     Reyes CHRISTELLA Kohut, PT 02/25/2023, 2:37 PM

## 2023-02-25 NOTE — Procedures (Signed)
  [facility]  [address] [place], [address]  [place], [place] [address] Tel: [phone] Fax: [phone] Test Date:  02/25/2023  Patient: Amy Guerrero DOB: 01-Mar-1980 Physician: Tonita Blanch, DO  Sex: Female Height: 5' 1 Ref Phys: Leonce Reveal, PA-C  ID#: [id number]   Technician:    History: This is a 43 year old female with history of dog bite to left lower referred for paresthesias and pain.    NCV & EMG Findings: Extensive electrodiagnostic testing of the left lower extremity and additional studies of the right shows:  Bilateral sural sensory responses are absent.  Bilateral superficial peroneal sensory responses are within normal limits. Left tibial motor response shows reduced amplitude (L4.6 mV); these findings are of uncertain clinical significance in the setting of normal needle examination.  Left peroneal and right tibial motor responses are within normal limits. Needle electrode examination is somewhat hampered by poor activation, due to pain.  There is no active or chronic motor axonal loss changes affecting any of the tested muscles.  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  Patient has history of traumatic injury to both lower extremities which could potentially explain these findings.  Correlate clinically.      ___________________________ Tonita Blanch, DO    Nerve Conduction Studies   Stim Site NR Peak (ms) Norm Peak (ms) O-P Amp (V) Norm O-P Amp  Left Sup Peroneal Anti Sensory (Ant Lat Mall)  32 C  12 cm    2.5 <4.5 14.9 >5  Right Sup Peroneal Anti Sensory (Ant Lat Mall)  32 C  12 cm    2.6 <4.5 14.2 >5  Left Sural Anti Sensory (Lat Mall)  32 C  Calf *NR  <4.5  >5  Right Sural Anti Sensory (Lat Mall)  32 C  Calf *NR  <4.5  >5     Stim Site NR Onset (ms) Norm Onset (ms) O-P Amp (mV) Norm O-P Amp Site1 Site2 Delta-0 (ms) Dist (cm) Vel (m/s)  Norm Vel (m/s)  Left Peroneal Motor (Ext Dig Brev)  32 C  Ankle    4.1 <5.5 5.6 >3 B Fib Ankle 5.9 36.0 61 >40  B Fib    10.0  5.1  Poplt B Fib 1.3 8.0 62 >40  Poplt    11.3  5.1         Left Tibial Motor (Abd Hall Brev)  32 C  Ankle    3.4 <6.0 *4.6 >8 Knee Ankle 6.4 35.0 55 >40  Knee    9.8  2.8         Right Tibial Motor (Abd Hall Brev)  32 C  Ankle    3.0 <6.0 8.2 >8 Knee Ankle 7.3 35.0 48 >40  Knee    10.3  5.0          Electromyography   Side Muscle Ins.Act Fibs Fasc Recrt Amp Dur Poly Activation Comment  Left AntTibialis Nml Nml Nml *1- Nml Nml Nml *Variable N/A  Left Gastroc Nml Nml Nml *1- Nml Nml Nml *Variable N/A  Left Flex Dig Long Nml Nml Nml *1- Nml Nml Nml *Variable N/A  Left BicepsFemS Nml Nml Nml *1- Nml Nml Nml *Variable N/A  Left RectFemoris Nml Nml Nml *1- Nml Nml Nml *Variable N/A      Waveforms:

## 2023-02-26 ENCOUNTER — Ambulatory Visit: Payer: BC Managed Care – PPO

## 2023-03-15 ENCOUNTER — Inpatient Hospital Stay (HOSPITAL_BASED_OUTPATIENT_CLINIC_OR_DEPARTMENT_OTHER): Payer: BC Managed Care – PPO | Admitting: Internal Medicine

## 2023-03-15 ENCOUNTER — Inpatient Hospital Stay: Payer: BC Managed Care – PPO | Attending: Internal Medicine

## 2023-03-15 VITALS — BP 128/88 | HR 73 | Temp 98.1°F | Resp 16 | Ht 61.0 in | Wt 156.6 lb

## 2023-03-15 DIAGNOSIS — D72819 Decreased white blood cell count, unspecified: Secondary | ICD-10-CM

## 2023-03-15 DIAGNOSIS — E611 Iron deficiency: Secondary | ICD-10-CM | POA: Insufficient documentation

## 2023-03-15 DIAGNOSIS — E538 Deficiency of other specified B group vitamins: Secondary | ICD-10-CM | POA: Diagnosis not present

## 2023-03-15 DIAGNOSIS — D500 Iron deficiency anemia secondary to blood loss (chronic): Secondary | ICD-10-CM

## 2023-03-15 LAB — CBC WITH DIFFERENTIAL (CANCER CENTER ONLY)
Abs Immature Granulocytes: 0 10*3/uL (ref 0.00–0.07)
Basophils Absolute: 0 10*3/uL (ref 0.0–0.1)
Basophils Relative: 1 %
Eosinophils Absolute: 0 10*3/uL (ref 0.0–0.5)
Eosinophils Relative: 0 %
HCT: 36.8 % (ref 36.0–46.0)
Hemoglobin: 12.4 g/dL (ref 12.0–15.0)
Immature Granulocytes: 0 %
Lymphocytes Relative: 37 %
Lymphs Abs: 1.1 10*3/uL (ref 0.7–4.0)
MCH: 25.1 pg — ABNORMAL LOW (ref 26.0–34.0)
MCHC: 33.7 g/dL (ref 30.0–36.0)
MCV: 74.3 fL — ABNORMAL LOW (ref 80.0–100.0)
Monocytes Absolute: 0.3 10*3/uL (ref 0.1–1.0)
Monocytes Relative: 11 %
Neutro Abs: 1.5 10*3/uL — ABNORMAL LOW (ref 1.7–7.7)
Neutrophils Relative %: 51 %
Platelet Count: 211 10*3/uL (ref 150–400)
RBC: 4.95 MIL/uL (ref 3.87–5.11)
RDW: 14.1 % (ref 11.5–15.5)
WBC Count: 3 10*3/uL — ABNORMAL LOW (ref 4.0–10.5)
nRBC: 0 % (ref 0.0–0.2)

## 2023-03-15 LAB — IRON AND IRON BINDING CAPACITY (CC-WL,HP ONLY)
Iron: 35 ug/dL (ref 28–170)
Saturation Ratios: 7 % — ABNORMAL LOW (ref 10.4–31.8)
TIBC: 514 ug/dL — ABNORMAL HIGH (ref 250–450)
UIBC: 479 ug/dL — ABNORMAL HIGH (ref 148–442)

## 2023-03-15 NOTE — Progress Notes (Signed)
[facility] [phone]   [phone]  OFFICE PROGRESS NOTE  Amy Fendt, NP 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  Bone marrow biopsy and aspirate in December 2024 showed no concerning abnormalities.  PRIOR THERAPY: None  CURRENT THERAPY: Observation  INTERVAL HISTORY: Amy Guerrero 43 y.o. female returns to the clinic today for follow-up visit.Discussed the use of AI scribe software for clinical note transcription with the patient, who gave verbal consent to proceed.  History of Present Illness   The patient, a 43 year old individual with a history of low white blood cell count, presented for a follow-up visit after a bone marrow biopsy and aspirate conducted in December. The biopsy showed no evidence of cancer, leukemia, myelodysplastic, or myeloproliferative disorders. The patient reported feeling generally well, with no new health concerns, but noted persistent fatigue.  The patient had recently run out of prescribed iron medication and was advised to switch to an over-the-counter alternative. She was not currently taking folic acid, despite a noted deficiency which could contribute to the low white cell count.  The patient's white blood cell count had shown a slight improvement since the last visit, increasing from 2.9 to 3.0.       MEDICAL HISTORY: Past Medical History:  Diagnosis Date   Asthma    Sinusitis     ALLERGIES:  is allergic to vancomycin.  MEDICATIONS:  Current Outpatient Medications  Medication Sig Dispense Refill   albuterol (PROVENTIL) (2.5 MG/3ML) 0.083% nebulizer solution Take 3 mLs (2.5 mg total) by nebulization every 6 (six) hours as needed for wheezing or shortness of breath. 150 mL 2   albuterol (VENTOLIN HFA) 108 (90 Base) MCG/ACT inhaler Inhale 2 puffs into the lungs every 6 (six) hours as needed for wheezing or shortness  of breath. 18 g 2   amoxicillin-clavulanate (AUGMENTIN) 500-125 MG tablet Take 1 tablet by mouth 3 (three) times daily. (Patient not taking: Reported on 01/06/2023)     amoxicillin-clavulanate (AUGMENTIN) 875-125 MG tablet Take 1 tablet by mouth every 12 (twelve) hours. (Patient not taking: Reported on 01/06/2023) 14 tablet 0   amoxicillin-clavulanate (AUGMENTIN) 875-125 MG tablet Take 1 tablet by mouth every 12 (twelve) hours. (Patient not taking: Reported on 01/06/2023)     Cetirizine HCl 10 MG CAPS Take 1 capsule (10 mg total) by mouth daily. 90 capsule 0   cyclobenzaprine (FLEXERIL) 5 MG tablet Take 1 tablet (5 mg total) by mouth 2 (two) times daily as needed for muscle spasms. (Patient not taking: Reported on 07/31/2022) 20 tablet 0   ferrous gluconate (FERGON) 324 MG tablet Take 1 tablet (324 mg total) by mouth daily with breakfast. 30 tablet 3   gabapentin (NEURONTIN) 300 MG capsule Take 1 capsule (300 mg total) by mouth 3 (three) times daily. 90 capsule 0   mupirocin ointment (BACTROBAN) 2 % Apply 1 Application topically 2 (two) times daily. (Patient not taking: Reported on 02/06/2023) 22 g 0   naproxen (NAPROSYN) 375 MG tablet Take 1 tablet (375 mg total) by mouth 2 (two) times daily with a meal. (Patient not taking: Reported on 02/06/2023) 14 tablet 0   ondansetron (ZOFRAN-ODT) 8 MG disintegrating tablet Take 1 tablet (8 mg total) by mouth every 8 (eight) hours as needed for nausea. (Patient not taking: Reported on 07/31/2022) 10 tablet 0   oxyCODONE (OXY IR/ROXICODONE) 5 MG immediate release tablet Take 1 tablet (5 mg  total) by mouth every 6 (six) hours as needed for moderate pain. (Patient not taking: Reported on 01/06/2023) 15 tablet 0   predniSONE (DELTASONE) 20 MG tablet TAKE 2 TABLETS BY MOUTH EVERY DAY FOR 5 DAYS (Patient not taking: Reported on 02/06/2023)     Spacer/Aero-Holding Chambers (AEROCHAMBER PLUS) inhaler Use as instructed (Patient not taking: Reported on 07/31/2022) 1 each 2    No current facility-administered medications for this visit.    SURGICAL HISTORY:  Past Surgical History:  Procedure Laterality Date   CARPAL TUNNEL RELEASE Right    TUBAL LIGATION     tubes tied      REVIEW OF SYSTEMS:  A comprehensive review of systems was negative.   PHYSICAL EXAMINATION: General appearance: alert, cooperative, and no distress Head: Normocephalic, without obvious abnormality, atraumatic Neck: no adenopathy, no JVD, supple, symmetrical, trachea midline, and thyroid not enlarged, symmetric, no tenderness/mass/nodules Lymph nodes: Cervical, supraclavicular, and axillary nodes normal. Resp: clear to auscultation bilaterally Back: symmetric, no curvature. ROM normal. No CVA tenderness. Cardio: regular rate and rhythm, S1, S2 normal, no murmur, click, rub or gallop GI: soft, non-tender; bowel sounds normal; no masses,  no organomegaly Extremities: extremities normal, atraumatic, no cyanosis or edema  ECOG PERFORMANCE STATUS: 0 - Asymptomatic  Blood pressure 128/88, pulse 73, temperature 98.1 F (36.7 C), temperature source Temporal, resp. rate 16, height 5\' 1"  (1.549 m), weight 156 lb 9.6 oz (71 kg), SpO2 100%.  LABORATORY DATA: Lab Results  Component Value Date   WBC 3.0 (L) 03/15/2023   HGB 12.4 03/15/2023   HCT 36.8 03/15/2023   MCV 74.3 (L) 03/15/2023   PLT 211 03/15/2023      Chemistry      Component Value Date/Time   NA 136 02/06/2023 1007   NA 137 01/06/2023 1111   K 3.7 02/06/2023 1007   CL 106 02/06/2023 1007   CO2 25 02/06/2023 1007   BUN 11 02/06/2023 1007   BUN 12 01/06/2023 1111   CREATININE 0.89 02/06/2023 1007      Component Value Date/Time   CALCIUM 8.6 (L) 02/06/2023 1007   ALKPHOS 69 02/06/2023 1007   AST 17 02/06/2023 1007   ALT 12 02/06/2023 1007   BILITOT 0.3 02/06/2023 1007       RADIOGRAPHIC STUDIES: NCV with EMG(electromyography) Result Date: 02/25/2023 Amy Chard, DO     02/25/2023  1:44 PM [facility]  [address] [place], [address]  [place], [place] [address] Tel: [phone] Fax: [phone] Test Date:  02/25/2023 Patient: Amy Guerrero DOB: [birth year] Physician: Amy Sickle, DO Sex: Female Height: 5\' 1"  Ref Phys: Hazle Nordmann, PA-C ID#: [id number]   Technician:  History: This is a 43 year old female with history of dog bite to left lower referred for paresthesias and pain.  NCV & EMG Findings: Extensive electrodiagnostic testing of the left lower extremity and additional studies of the right shows: Bilateral sural sensory responses are absent.  Bilateral superficial peroneal sensory responses are within normal limits. Left tibial motor response shows reduced amplitude (L4.6 mV); these findings are of uncertain clinical significance in the setting of normal needle examination.  Left peroneal and right tibial motor responses are within normal limits. Needle electrode examination is somewhat hampered by poor activation, due to pain.  There is no active or chronic motor axonal loss changes affecting any of the tested muscles. 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  Patient has history of traumatic injury to  both lower extremities which could potentially explain these findings.  Correlate clinically.  ___________________________ Amy Sickle, DO Nerve Conduction Studies  Stim Site NR Peak (ms) Norm Peak (ms) O-P Amp (V) Norm O-P Amp Left Sup Peroneal Anti Sensory (Ant Lat Mall)  32 C 12 cm    2.5 <4.5 14.9 >5 Right Sup Peroneal Anti Sensory (Ant Lat Mall)  32 C 12 cm    2.6 <4.5 14.2 >5 Left Sural Anti Sensory (Lat Mall)  32 C Calf *NR  <4.5  >5 Right Sural Anti Sensory (Lat Mall)  32 C Calf *NR  <4.5  >5  Stim Site NR Onset (ms) Norm Onset (ms) O-P Amp (mV) Norm O-P Amp Site1 Site2 Delta-0 (ms) Dist (cm) Vel (m/s) Norm Vel (m/s) Left Peroneal Motor (Ext Dig Brev)  32 C Ankle    4.1 <5.5 5.6 >3 B Fib Ankle  5.9 36.0 61 >40 B Fib    10.0  5.1  Poplt B Fib 1.3 8.0 62 >40 Poplt    11.3  5.1        Left Tibial Motor (Abd Hall Brev)  32 C Ankle    3.4 <6.0 *4.6 >8 Knee Ankle 6.4 35.0 55 >40 Knee    9.8  2.8        Right Tibial Motor (Abd Hall Brev)  32 C Ankle    3.0 <6.0 8.2 >8 Knee Ankle 7.3 35.0 48 >40 Knee    10.3  5.0        Electromyography  Side Muscle Ins.Act Fibs Fasc Recrt Amp Dur Poly Activation Comment Left AntTibialis Nml Nml Nml *1- Nml Nml Nml *Variable N/A Left Gastroc Nml Nml Nml *1- Nml Nml Nml *Variable N/A Left Flex Dig Long Nml Nml Nml *1- Nml Nml Nml *Variable N/A Left BicepsFemS Nml Nml Nml *1- Nml Nml Nml *Variable N/A Left RectFemoris Nml Nml Nml *1- Nml Nml Nml *Variable N/A Waveforms:                CT BONE MARROW BIOPSY & ASPIRATION Result Date: 02/16/2023 CLINICAL DATA:  Leukopenia and anemia. The patient requires bone marrow biopsy for further evaluation. EXAM: CT GUIDED BONE MARROW ASPIRATION AND BIOPSY ANESTHESIA/SEDATION: Moderate (conscious) sedation was employed during this procedure. A total of Versed 3.0 mg and Fentanyl 50 mcg was administered intravenously. Moderate Sedation Time: minutes. The patient's level of consciousness and vital signs were monitored continuously by radiology nursing throughout the procedure under my direct supervision. PROCEDURE: The procedure risks, benefits, and alternatives were explained to the patient. Questions regarding the procedure were encouraged and answered. The patient understands and consents to the procedure. A time out was performed prior to initiating the procedure. The right gluteal region was prepped with chlorhexidine. Sterile gown and sterile gloves were used for the procedure. Local anesthesia was provided with 1% Lidocaine. Under CT guidance, an 11 gauge On Control bone cutting needle was advanced from a posterior approach into the right iliac bone. Needle positioning was confirmed with CT. Initial non heparinized and heparinized  aspirate samples were obtained of bone marrow. Core biopsy was performed via the On Control drill needle. COMPLICATIONS: None FINDINGS: Inspection of initial aspirate show sparse particles. Core biopsy was very difficult to obtain due to depth of the bone and firmness of the cortex of the right iliac bone. A limited core biopsy sample was obtained. IMPRESSION: CT guided bone marrow biopsy of right posterior iliac bone with both aspirate and core samples obtained. Electronically Signed  By: Irish Lack M.D.   On: 02/16/2023 15:07    ASSESSMENT AND PLAN:     Leukopenia Chronic leukopenia. Bone marrow biopsy and aspirate in December showed no malignancy or myelodysplastic/myeloproliferative disorders. Current WBC count improved from 2.9 to 3.0. Differential includes medication effects, autoimmune disorders, or ethnic origin. Patient reports fatigue but otherwise well. Discussed further evaluation with rheumatoid factor and ANA tests to rule out autoimmune disorders. - Order rheumatoid factor and ANA tests - Follow-up in three months to recheck WBC count  Iron Deficiency Patient reports running out of iron medication. Iron deficiency can contribute to fatigue and leukopenia. Advised to take OTC iron supplements with orange juice to enhance absorption. - Take OTC ferrous sulfate daily or every other day with orange juice  Folate Deficiency Previously low folic acid levels contributing to leukopenia. Patient not currently taking folic acid supplements. Advised to take OTC folic acid. Explained necessity of supplementation to prevent immune system weakening. - Take OTC folic acid (400 mcg), two tablets daily  Follow-up - Follow-up appointment in three months.   The patient was advised to call immediately if she has any other concerning symptoms in the interval.  The patient voices understanding of current disease status and treatment options and is in agreement with the current care plan.  All  questions were answered. The patient knows to call the clinic with any problems, questions or concerns. We can certainly see the patient much sooner if necessary. The total time spent in the appointment was 20 minutes.  Disclaimer: This note was dictated with voice recognition software. Similar sounding words can inadvertently be transcribed and may not be corrected upon review.

## 2023-03-16 ENCOUNTER — Other Ambulatory Visit: Payer: Self-pay | Admitting: Internal Medicine

## 2023-03-16 DIAGNOSIS — R760 Raised antibody titer: Secondary | ICD-10-CM

## 2023-03-16 LAB — FERRITIN: Ferritin: 9 ng/mL — ABNORMAL LOW (ref 11–307)

## 2023-03-16 LAB — ANTINUCLEAR ANTIBODIES, IFA: ANA Ab, IFA: POSITIVE — AB

## 2023-03-16 LAB — FANA STAINING PATTERNS: Speckled Pattern: 24529 — ABNORMAL HIGH

## 2023-03-16 LAB — RHEUMATOID FACTOR: Rheumatoid fact SerPl-aCnc: 10.2 [IU]/mL (ref <14.0)

## 2023-03-18 ENCOUNTER — Telehealth: Payer: Self-pay

## 2023-03-18 NOTE — Telephone Encounter (Signed)
Spoke with patient in regards to ANA lab results.  Per Dr. Arbutus Ped- ANA is high.  Referring patient to rheumatology.  Patient stated that the first available appt is in June.  Patient is concerned about how far out appt is.  Informed patient I will reach out to Dr. Arbutus Ped and see if theres another option.

## 2023-03-19 ENCOUNTER — Other Ambulatory Visit: Payer: Self-pay

## 2023-03-19 DIAGNOSIS — R760 Raised antibody titer: Secondary | ICD-10-CM

## 2023-03-19 NOTE — Telephone Encounter (Signed)
Informed patient another referral was sent to [facility] for Dr. Casimer Lanius in Rheumatology to see if their office can see her sooner than June.  Informed patient to give our office a call if she hasn't heard from them in a week.  Patient verbalized thanks.

## 2023-03-27 ENCOUNTER — Encounter: Payer: Self-pay | Admitting: Internal Medicine

## 2023-03-30 ENCOUNTER — Other Ambulatory Visit: Payer: Self-pay

## 2023-03-30 ENCOUNTER — Other Ambulatory Visit (HOSPITAL_COMMUNITY)
Admission: RE | Admit: 2023-03-30 | Discharge: 2023-03-30 | Disposition: A | Discharge status: Home / Self Care | Payer: BC Managed Care – PPO | Source: Ambulatory Visit | Attending: Obstetrics and Gynecology | Admitting: Obstetrics and Gynecology

## 2023-03-30 ENCOUNTER — Encounter: Payer: Self-pay | Admitting: Obstetrics and Gynecology

## 2023-03-30 ENCOUNTER — Ambulatory Visit: Payer: BC Managed Care – PPO | Admitting: Obstetrics and Gynecology

## 2023-03-30 VITALS — BP 127/91 | HR 68

## 2023-03-30 DIAGNOSIS — N920 Excessive and frequent menstruation with regular cycle: Secondary | ICD-10-CM

## 2023-03-30 DIAGNOSIS — Z01419 Encounter for gynecological examination (general) (routine) without abnormal findings: Secondary | ICD-10-CM

## 2023-03-30 DIAGNOSIS — N946 Dysmenorrhea, unspecified: Secondary | ICD-10-CM

## 2023-03-30 DIAGNOSIS — Z1331 Encounter for screening for depression: Secondary | ICD-10-CM

## 2023-03-30 NOTE — Progress Notes (Signed)
ANNUAL EXAM Patient name: Amy Guerrero MRN [id number]  Date of birth: November 23, 1980 Chief Complaint:   Gynecologic Exam  History of Present Illness:   Amy Guerrero is a 43 y.o. [phone]  female being seen today for a routine annual exam.  Current complaints: G4P4 S/p BTL 2001. Sexually active, recently had STD screening in November, does not desire today. Reports always having heavy cycles, reports cycles 3-5 days will sometimes change pad at an hour d/t saturation.Reports dysmennorhea with cycle. No bleeding outside of cycle.   No LMP recorded. (Menstrual status: Bilateral Tubal Ligation).   The pregnancy intention screening data noted above was reviewed. Potential methods of contraception were discussed. The patient elected to proceed with No data recorded.   Last pap 3-4 yrs . Results were:  reports normal hx  . H/O abnormal pap: no Last mammogram: n/a. Results were: N/A. Family h/o breast cancer: yes mother       03/30/2023    4:03 PM 02/06/2023   10:43 AM 02/06/2023   10:42 AM 01/06/2023   10:17 AM 07/31/2022   11:54 AM  Depression screen PHQ 2/9  Decreased Interest 0 2 2 0 2  Down, Depressed, Hopeless 0 1 1 0 0  PHQ - 2 Score 0 3 3 0 2  Altered sleeping 0    1  Tired, decreased energy 0      Change in appetite 0    1  Feeling bad or failure about yourself  0    0  Trouble concentrating 0    0  Moving slowly or fidgety/restless 0    0  Suicidal thoughts 0    0  PHQ-9 Score 0    4  Difficult doing work/chores     Not difficult at all        03/30/2023    4:03 PM 01/06/2023   10:17 AM 07/31/2022   11:55 AM  GAD 7 : Generalized Anxiety Score  Nervous, Anxious, on Edge 0 0 2  Control/stop worrying 0 0 1  Worry too much - different things 0 0 1  Trouble relaxing 0 0 1  Restless 0 0 0  Easily annoyed or irritable 0 0 0  Afraid - awful might happen 0 0 2  Total GAD 7 Score 0 0 7  Anxiety Difficulty  Not difficult at all Not difficult at all     Review of  Systems:   Pertinent items are noted in HPI Denies any headaches, blurred vision, fatigue, shortness of breath, chest pain, abdominal pain, abnormal vaginal discharge/itching/odor/irritation, problems with periods, bowel movements, urination, or intercourse unless otherwise stated above. Pertinent History Reviewed:  Reviewed past medical,surgical, social and family history.  Reviewed problem list, medications and allergies. Physical Assessment:   Vitals:   03/30/23 0856 03/30/23 0935  BP: (!) 123/91 (!) 127/91  Pulse: 83 68  There is no height or weight on file to calculate BMI.        Physical Examination:   General appearance - well appearing, and in no distress  Mental status - alert, oriented to person, place, and time  Psych:  She has a normal mood and affect  Skin - warm and dry, normal color, no suspicious lesions noted  Chest - effort normal, all lung fields clear to auscultation bilaterally  Heart - normal rate and regular rhythm  Neck:  midline trachea, no thyromegaly or nodules  Breasts - breasts appear normal, no suspicious masses, no skin or nipple changes  or  axillary nodes  Abdomen - soft, nontender, nondistended, no masses or organomegaly  Pelvic - VULVA: normal appearing vulva with no masses, tenderness or lesions  VAGINA: normal appearing vagina with normal color and discharge, no lesions  CERVIX: normal appearing cervix without discharge or lesions, no CMT  Thin prep pap is done  HR HPV cotesting  UTERUS: uterus is felt to be normal size, shape, consistency and nontender   ADNEXA: No adnexal masses or tenderness noted.  Extremities:  No swelling or varicosities noted  Chaperone present for exam  No results found for this or any previous visit (from the past 24 hours).  Assessment & Plan:  1. Well woman exam with routine gynecological exam (Primary) Continue routine self breast exams PCPs ordered mammogram , she will schedule this Following with PCP - Cytology  - PAP( [place])  2. Menorrhagia with regular cycle 3. Dysmenorrhea Discussed options for bleeding including hormonal IUD, TXA, progestin options d/t elevated BP. Discussed r/b and side effects. Discussed IUD insertion with meds vs on cycle, Will schedule for IUD while on cycle, encouraged ibuprofen prior to procedure   Labs/procedures today:   Mammogram: schedule screening mammo as soon as possible, or sooner if problems  No orders of the defined types were placed in this encounter.   Meds: No orders of the defined types were placed in this encounter.   Follow-up: Return in about 1 year (around 03/29/2024), or 3 weeks IUD insertion, for Lucina Mellow   Future Appointments  Date Time Provider Department Center  04/07/2023 11:20 AM Maisie Fus, MD [facility] None  04/27/2023  1:35 PM Warden Fillers, MD [facility]  06/03/2023  3:15 PM [facility] None  06/03/2023  3:45 PM Si Gaul, MD [facility] None  08/06/2023 10:20 AM Pollyann Savoy, MD [facility] None  09/10/2023 11:20 AM Pollyann Savoy, MD [facility] None  01/06/2024 10:00 AM Rema Fendt, NP [facility] None     Albertine Grates, FNP

## 2023-04-05 LAB — CYTOLOGY - PAP
Comment: NEGATIVE
Diagnosis: NEGATIVE
High risk HPV: NEGATIVE

## 2023-04-07 ENCOUNTER — Ambulatory Visit: Payer: BC Managed Care – PPO | Attending: Internal Medicine | Admitting: Internal Medicine

## 2023-04-07 ENCOUNTER — Encounter: Payer: Self-pay | Admitting: Internal Medicine

## 2023-04-07 VITALS — BP 120/80 | HR 76 | Ht 61.0 in | Wt 154.0 lb

## 2023-04-07 DIAGNOSIS — R079 Chest pain, unspecified: Secondary | ICD-10-CM | POA: Diagnosis not present

## 2023-04-07 DIAGNOSIS — I10 Essential (primary) hypertension: Secondary | ICD-10-CM

## 2023-04-07 NOTE — Progress Notes (Signed)
  Cardiology Office Note:  .   Date:  04/07/2023  ID:  Amy Guerrero, DOB [birth year], MRN [id number] PCP: Rema Fendt, NP  [facility] Providers Cardiologist:  None    History of Present Illness: .   Amy Guerrero is a 43 y.o. female with hx of asthma, history of leukocytopenia likely related to affect margin, hypertension, ,  she is being referred today by her primary provider for intermittent chest pain and family history of MI.  She presents today with a couple of years of daily chest pain. She describes the pain as sharp, located in the chest, and associated with shortness of breath. The pain has been stable for the past couple of months. The patient has an active job which involves heavy lifting. She also has a history of asthma, which could be contributing to her symptoms.      ROS:  per HPI otherwise negative   Studies Reviewed: Marland Kitchen   EKG Interpretation Date/Time:  Wednesday April 07 2023 11:32:35 EST Ventricular Rate:  76 PR Interval:  136 QRS Duration:  72 QT Interval:  400 QTC Calculation: 450 R Axis:   68  Text Interpretation: Normal sinus rhythm Normal ECG When compared with ECG of 21-Aug-2022 22:10, No change from prior tracing Confirmed by Carolan Clines ([id number]) on 04/07/2023 11:47:15 AM  EKG Interpretation    Risk Assessment/Calculations:    Physical Exam:   VS:  BP 120/80 (BP Location: Left Arm, Patient Position: Sitting, Cuff Size: Normal)   Pulse 76   Ht 5\' 1"  (1.549 m)   Wt 154 lb (69.9 kg)   BMI 29.10 kg/m    Wt Readings from Last 3 Encounters:  04/07/23 154 lb (69.9 kg)  03/15/23 156 lb 9.6 oz (71 kg)  02/16/23 153 lb (69.4 kg)    GEN: Well nourished, well developed in no acute distress NECK: No JVD; No carotid bruits CARDIAC: RRR, no murmurs, rubs, gallops RESPIRATORY:  Clear to auscultation without rales, wheezing or rhonchi  ABDOMEN: Soft, non-tender, non-distended EXTREMITIES:  No edema; No deformity   ASSESSMENT AND PLAN: .     MSK Chest pain Chronic, sharp chest pain for a couple of years. No recent exacerbation. EKG normal with no evidence of prior or current myocardial infarction. Given the patient's age, non-smoking status, and physical activity, the risk of coronary artery disease is low. The pain could be musculoskeletal or related to asthma.   -Try over-the-counter Tylenol or a heating pad for pain relief.   -Notify the clinic if there are any changes in the chest pain, especially if it becomes more of a pressure sensation.    Asthma   Chronic condition, may be contributing to chest pain.   -Continue current management plan.    Family History of Heart Disease   Both parents had heart disease in their sixties. The patient is aware and concerned about this history.   -Continue to monitor for changes in chest pain or other symptoms suggestive of heart disease.   -Continue healthy lifestyle practices  Hypertension   Blood pressure fluctuates.   -Continue current management plan.    Dispo:Follow-up as needed.     Signed, Maisie Fus, MD

## 2023-04-07 NOTE — Patient Instructions (Signed)
Medication Instructions:  Continue same medications   Lab Work: None ordered   Testing/Procedures: None ordered   Follow-Up: At [facility], you and your health needs are our priority.  As part of our continuing mission to provide you with exceptional heart care, we have created designated Provider Care Teams.  These Care Teams include your primary Cardiologist (physician) and Advanced Practice Providers (APPs -  Physician Assistants and Nurse Practitioners) who all work together to provide you with the care you need, when you need it.  We recommend signing up for the patient portal called "MyChart".  Sign up information is provided on this After Visit Summary.  MyChart is used to connect with patients for Virtual Visits (Telemedicine).  Patients are able to view lab/test results, encounter notes, upcoming appointments, etc.  Non-urgent messages can be sent to your provider as well.   To learn more about what you can do with MyChart, go to [email or web address].    Your next appointment:  As needed    Provider:  Dr.Branch

## 2023-04-27 ENCOUNTER — Ambulatory Visit: Payer: BC Managed Care – PPO | Admitting: Obstetrics and Gynecology

## 2023-04-27 ENCOUNTER — Other Ambulatory Visit: Payer: Self-pay

## 2023-04-27 VITALS — BP 145/92 | HR 80 | Ht 61.0 in | Wt 156.8 lb

## 2023-04-27 DIAGNOSIS — Z30430 Encounter for insertion of intrauterine contraceptive device: Secondary | ICD-10-CM | POA: Diagnosis not present

## 2023-04-27 MED ORDER — LEVONORGESTREL 20.1 MCG/DAY IU IUD
1.0000 | INTRAUTERINE_SYSTEM | Freq: Once | INTRAUTERINE | Status: AC
  Administered 2023-04-27: 1 via INTRAUTERINE

## 2023-04-27 NOTE — Progress Notes (Signed)
    GYNECOLOGY OFFICE PROCEDURE NOTE  Amy Guerrero is a 43 y.o. [phone] here for Liletta IUD insertion. No GYN concerns.  Last pap smear was on 2/25 and was normal.  IUD Insertion Procedure Note Patient identified, informed consent performed, consent signed.   Discussed risks of irregular bleeding, cramping, infection, malpositioning or misplacement of the IUD outside the uterus which may require further procedure such as laparoscopy. Also discussed >99% contraception efficacy, increased risk of ectopic pregnancy with failure of method.  Time out was performed.  Urine pregnancy test negative.  Pt also is s/p BTL.  She is receiving the IUD to help address heavy menses.  Pt advised the progesterone IUD can be effective in this usage, but she should give it 3-6 month before making a decision on treatment.  Speculum placed in the vagina.  Cervix visualized.  Cleaned with Betadine x 2.  Grasped anteriorly with a single tooth tenaculum.  Uterus sounded to 10 cm.  Mirena IUD placed per manufacturer's recommendations.  Strings trimmed to 3 cm. Tenaculum was removed, good hemostasis noted.  Patient tolerated procedure well.   Patient was given post-procedure instructions.  She was advised to have backup contraception for one week.  Patient was also asked to check IUD strings periodically and follow up in 4 weeks for IUD check.   Mariel Aloe, MD, FACOG Obstetrician & Gynecologist, [facility], [facility]

## 2023-05-17 ENCOUNTER — Encounter: Payer: Self-pay | Admitting: Certified Nurse Midwife

## 2023-05-26 ENCOUNTER — Ambulatory Visit: Admitting: Certified Nurse Midwife

## 2023-06-03 ENCOUNTER — Inpatient Hospital Stay: Payer: BC Managed Care – PPO | Attending: Internal Medicine

## 2023-06-03 ENCOUNTER — Inpatient Hospital Stay (HOSPITAL_BASED_OUTPATIENT_CLINIC_OR_DEPARTMENT_OTHER): Payer: BC Managed Care – PPO | Admitting: Internal Medicine

## 2023-06-03 VITALS — BP 127/86 | HR 73 | Temp 98.7°F | Resp 18 | Ht 61.0 in | Wt 150.0 lb

## 2023-06-03 DIAGNOSIS — D72819 Decreased white blood cell count, unspecified: Secondary | ICD-10-CM

## 2023-06-03 DIAGNOSIS — Z79899 Other long term (current) drug therapy: Secondary | ICD-10-CM | POA: Diagnosis not present

## 2023-06-03 LAB — IRON AND IRON BINDING CAPACITY (CC-WL,HP ONLY)
Iron: 360 ug/dL — ABNORMAL HIGH (ref 28–170)
Saturation Ratios: 97 % — ABNORMAL HIGH (ref 10.4–31.8)
TIBC: 371 ug/dL (ref 250–450)
UIBC: 11 ug/dL — ABNORMAL LOW (ref 148–442)

## 2023-06-03 LAB — FOLATE: Folate: >40 ng/mL (ref >5.9)

## 2023-06-03 LAB — CBC WITH DIFFERENTIAL (CANCER CENTER ONLY)
Abs Immature Granulocytes: 0 10*3/uL (ref 0.00–0.07)
Basophils Absolute: 0 10*3/uL (ref 0.0–0.1)
Basophils Relative: 1 %
Eosinophils Absolute: 0.1 10*3/uL (ref 0.0–0.5)
Eosinophils Relative: 3 %
HCT: 39 % (ref 36.0–46.0)
Hemoglobin: 13.1 g/dL (ref 12.0–15.0)
Immature Granulocytes: 0 %
Lymphocytes Relative: 43 %
Lymphs Abs: 0.9 10*3/uL (ref 0.7–4.0)
MCH: 25.9 pg — ABNORMAL LOW (ref 26.0–34.0)
MCHC: 33.6 g/dL (ref 30.0–36.0)
MCV: 77.1 fL — ABNORMAL LOW (ref 80.0–100.0)
Monocytes Absolute: 0.3 10*3/uL (ref 0.1–1.0)
Monocytes Relative: 12 %
Neutro Abs: 0.9 10*3/uL — ABNORMAL LOW (ref 1.7–7.7)
Neutrophils Relative %: 41 %
Platelet Count: 191 10*3/uL (ref 150–400)
RBC: 5.06 MIL/uL (ref 3.87–5.11)
RDW: 13.6 % (ref 11.5–15.5)
WBC Count: 2.1 10*3/uL — ABNORMAL LOW (ref 4.0–10.5)
nRBC: 0 % (ref 0.0–0.2)

## 2023-06-03 LAB — VITAMIN B12: Vitamin B-12: 315 pg/mL (ref 180–914)

## 2023-06-03 NOTE — Progress Notes (Signed)
 [facility] [phone]   [phone]  OFFICE PROGRESS NOTE  Rema Fendt, NP 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  Bone marrow biopsy and aspirate in December 2024 showed no concerning abnormalities.  PRIOR THERAPY: None  CURRENT THERAPY: Observation  INTERVAL HISTORY: Amy Guerrero 43 y.o. female returns to the clinic today for follow-up visit.Discussed the use of AI scribe software for clinical note transcription with the patient, who gave verbal consent to proceed.  History of Present Illness   Amy Guerrero is a 43 year old female with persistent leukocytopenia who presents for evaluation and repeat blood work. She was referred to Dr. Casimer Lanius for investigation of a possible autoimmune connective tissue disorder.  She has a history of persistent leukocytopenia of unclear etiology, suspected to be autoimmune mediated or possibly ethnic in origin. A bone marrow biopsy conducted in December 2024 was negative. She remains under observation for this condition.  No new symptoms since her last visit, and she describes her condition as 'the same'. No fatigue or weakness. Her white blood cell count has fluctuated, with a recent count of 2.1, which is lower than previous counts of 3 and 2.9.  She is currently taking supplements including folate at a dose of 800 mcg, iron, and biotin. She does not report taking any other medications.  She has seen a rheumatologist who is investigating the possibility of an autoimmune connective tissue disorder. She has a positive ANA test, and further testing is being conducted to rule out specific autoimmune conditions. She has a follow-up appointment scheduled.       MEDICAL HISTORY: Past Medical History:  Diagnosis Date   Asthma    Sinusitis     ALLERGIES:  is allergic to  vancomycin.  MEDICATIONS:  Current Outpatient Medications  Medication Sig Dispense Refill   albuterol (PROVENTIL) (2.5 MG/3ML) 0.083% nebulizer solution Take 3 mLs (2.5 mg total) by nebulization every 6 (six) hours as needed for wheezing or shortness of breath. 150 mL 2   albuterol (VENTOLIN HFA) 108 (90 Base) MCG/ACT inhaler Inhale 2 puffs into the lungs every 6 (six) hours as needed for wheezing or shortness of breath. 18 g 2   Cetirizine HCl 10 MG CAPS Take 1 capsule (10 mg total) by mouth daily. (Patient not taking: Reported on 03/30/2023) 90 capsule 0   ferrous gluconate (FERGON) 324 MG tablet Take 1 tablet (324 mg total) by mouth daily with breakfast. 30 tablet 3   FOLIC ACID PO Take by mouth daily.     gabapentin (NEURONTIN) 300 MG capsule Take 1 capsule (300 mg total) by mouth 3 (three) times daily. 90 capsule 0   Spacer/Aero-Holding Chambers (AEROCHAMBER PLUS) inhaler Use as instructed 1 each 2   No current facility-administered medications for this visit.    SURGICAL HISTORY:  Past Surgical History:  Procedure Laterality Date   CARPAL TUNNEL RELEASE Right    TUBAL LIGATION      REVIEW OF SYSTEMS:  A comprehensive review of systems was negative except for: Constitutional: positive for fatigue   PHYSICAL EXAMINATION: General appearance: alert, cooperative, fatigued, and no distress Head: Normocephalic, without obvious abnormality, atraumatic Neck: no adenopathy, no JVD, supple, symmetrical, trachea midline, and thyroid not enlarged, symmetric, no tenderness/mass/nodules Lymph nodes: Cervical, supraclavicular, and axillary nodes normal. Resp: clear to auscultation bilaterally Back: symmetric, no curvature. ROM normal. No CVA tenderness.  Cardio: regular rate and rhythm, S1, S2 normal, no murmur, click, rub or gallop GI: soft, non-tender; bowel sounds normal; no masses,  no organomegaly Extremities: extremities normal, atraumatic, no cyanosis or edema  ECOG PERFORMANCE STATUS:  0 - Asymptomatic  Blood pressure 127/86, pulse 73, temperature 98.7 F (37.1 C), temperature source Temporal, resp. rate 18, height 5\' 1"  (1.549 m), weight 150 lb (68 kg), SpO2 100%.  LABORATORY DATA: Lab Results  Component Value Date   WBC 2.1 (L) 06/03/2023   HGB 13.1 06/03/2023   HCT 39.0 06/03/2023   MCV 77.1 (L) 06/03/2023   PLT 191 06/03/2023      Chemistry      Component Value Date/Time   NA 136 02/06/2023 1007   NA 137 01/06/2023 1111   K 3.7 02/06/2023 1007   CL 106 02/06/2023 1007   CO2 25 02/06/2023 1007   BUN 11 02/06/2023 1007   BUN 12 01/06/2023 1111   CREATININE 0.89 02/06/2023 1007      Component Value Date/Time   CALCIUM 8.6 (L) 02/06/2023 1007   ALKPHOS 69 02/06/2023 1007   AST 17 02/06/2023 1007   ALT 12 02/06/2023 1007   BILITOT 0.3 02/06/2023 1007       RADIOGRAPHIC STUDIES: No results found.   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  She had a bone marrow biopsy and aspirate on December 2024 that showed no underlying MDS or lymphoproliferative disorder or plasma cell neoplasm.  There was no increased blast cells identified.    Leukocytopenia Persistent leukocytopenia of unclear etiology, likely autoimmune mediated versus ethnic in origin. Negative bone marrow biopsy. Current WBC count is 2.1, decreased from previous counts of 3.0 and 2.9. No new symptoms reported. Differential diagnosis includes autoimmune disorder, as suggested by elevated ANA and ongoing rheumatologic evaluation. - Await AVISE test results from rheumatologist - Re-evaluate in 6 months with repeat blood work  Suspected autoimmune disorder Elevated ANA suggests possible autoimmune disorder. Rheumatologist is investigating potential autoimmune connective tissue disorder. AVISE test is pending to rule out specific conditions.  Steroids were considered but not initiated to avoid interference with rheumatologist's investigation. - Await rheumatologist's evaluation and AVISE test results - Do not initiate steroids   The patient was advised to call immediately if she has any concerning symptoms in the interval. The patient voices understanding of current disease status and treatment options and is in agreement with the current care plan.  All questions were answered. The patient knows to call the clinic with any problems, questions or concerns. We can certainly see the patient much sooner if necessary. The total time spent in the appointment was 20 minutes.  Disclaimer: This note was dictated with voice recognition software. Similar sounding words can inadvertently be transcribed and may not be corrected upon review.

## 2023-06-04 ENCOUNTER — Telehealth: Payer: Self-pay | Admitting: Internal Medicine

## 2023-06-04 LAB — FERRITIN: Ferritin: 57 ng/mL (ref 11–307)

## 2023-06-04 NOTE — Telephone Encounter (Signed)
 Left a detailed message of appointment details. Informed patient to call back if appointments do not work.

## 2023-06-07 ENCOUNTER — Ambulatory Visit: Admission: EM | Admit: 2023-06-07 | Discharge: 2023-06-07 | Disposition: A | Discharge status: Home / Self Care

## 2023-06-07 DIAGNOSIS — D72819 Decreased white blood cell count, unspecified: Secondary | ICD-10-CM | POA: Insufficient documentation

## 2023-06-07 DIAGNOSIS — R209 Unspecified disturbances of skin sensation: Secondary | ICD-10-CM | POA: Insufficient documentation

## 2023-06-07 DIAGNOSIS — M25511 Pain in right shoulder: Secondary | ICD-10-CM

## 2023-06-07 HISTORY — DX: Cardiac murmur, unspecified: R01.1

## 2023-06-07 MED ORDER — NAPROXEN 500 MG PO TABS: 500.0000 mg | ORAL_TABLET | Freq: Two times a day (BID) | ORAL | 0 refills | Status: AC

## 2023-06-07 MED ORDER — DEXAMETHASONE SODIUM PHOSPHATE 10 MG/ML IJ SOLN
10.0000 mg | Freq: Once | INTRAMUSCULAR | Status: AC
  Administered 2023-06-07: 10 mg via INTRAMUSCULAR

## 2023-06-07 MED ORDER — GABAPENTIN 100 MG PO CAPS: 100.0000 mg | ORAL_CAPSULE | Freq: Three times a day (TID) | ORAL | 0 refills | Status: AC

## 2023-06-07 MED ORDER — OXYCODONE HCL 5 MG PO TABS: 5.0000 mg | ORAL_TABLET | ORAL | 0 refills | Status: AC | PRN

## 2023-06-07 MED ORDER — KETOROLAC TROMETHAMINE 60 MG/2ML IM SOLN
60.0000 mg | Freq: Once | INTRAMUSCULAR | Status: AC
  Administered 2023-06-07: 60 mg via INTRAMUSCULAR

## 2023-06-07 NOTE — ED Provider Notes (Signed)
 [name]    CSN: [id number] Arrival date & time: 06/07/23  1515      History   Chief Complaint Chief Complaint  Patient presents with   Arm Pain   Shoulder Pain    HPI Amy Guerrero is a 43 y.o. female.   Amy Guerrero is a 43 year old female who presents with right shoulder pain that began approximately one week ago and has progressively worsened. She denies any injury or prior history of shoulder issues. The pain is constant and localized to the right shoulder, with intermittent sharp, debilitating radiation into the upper right arm. The pain varies in severity and is aggravated by movement, use, and even light touch. She reports being unable to lift her arm due to the pain and also notes some weakness, though she denies any numbness, tingling, loss of sensation, or complete loss of motion in the right upper extremity. She has attempted symptomatic relief with Motrin , ice, and heat, but reports no improvement.  Notably, the patient is currently under evaluation by [facility] at [facility] for leukopenia, a positive ANA, and arthralgias. The underlying cause of her symptoms is still unclear. She is undergoing further diagnostic testing for possible autoimmune connective tissue disease or spondyloarthritis. Several follow-up appointments have already been scheduled in relation to this evaluation.   The following portions of the patient's history were reviewed and updated as appropriate: allergies, current medications, past family history, past medical history, past social history, past surgical history, and problem list.       Past Medical History:  Diagnosis Date   Asthma    Heart murmur    Sinusitis     Patient Active Problem List   Diagnosis Date Noted   Skin sensation disturbance 06/07/2023   Leucopenia 06/07/2023   Lymphedema 08/22/2022   Nerve damage 08/22/2022   Dog bite 08/21/2022   Pain in finger of right hand  07/07/2022   Essential hypertension 05/07/2020   Anxiety and depression 05/07/2020    Past Surgical History:  Procedure Laterality Date   CARPAL TUNNEL RELEASE Right    TUBAL LIGATION      OB History     Gravida  4   Para  4   Term  4   Preterm  0   AB  0   Living  4      SAB  0   IAB  0   Ectopic  0   Multiple  0   Live Births  4            Home Medications    Prior to Admission medications   Medication Sig Start Date End Date Taking? Authorizing Provider  gabapentin  (NEURONTIN ) 100 MG capsule Take 1 capsule (100 mg total) by mouth 3 (three) times daily. 06/07/23  Yes Eileen Croswell, FNP  naproxen  (NAPROSYN ) 500 MG tablet Take 1 tablet (500 mg total) by mouth 2 (two) times daily with a meal. 06/07/23  Yes Maryruth Sol, FNP  oxyCODONE  (ROXICODONE ) 5 MG immediate release tablet Take 1 tablet (5 mg total) by mouth every 4 (four) hours as needed for severe pain (pain score 7-10). 06/07/23  Yes Maryruth Sol, FNP  albuterol  (PROVENTIL ) (2.5 MG/3ML) 0.083% nebulizer solution Take 3 mLs (2.5 mg total) by nebulization every 6 (six) hours as needed for wheezing or shortness of breath. 07/31/22   Senaida Dama, NP  albuterol  (VENTOLIN  HFA) 108 (90 Base) MCG/ACT inhaler Inhale 2 puffs into the lungs every  6 (six) hours as needed for wheezing or shortness of breath. 02/08/23   Senaida Dama, NP  Cetirizine  HCl 10 MG CAPS Take 1 capsule (10 mg total) by mouth daily. Patient not taking: Reported on 03/30/2023 12/26/21 03/26/22  Senaida Dama, NP  ferrous gluconate  (FERGON) 324 MG tablet Take 1 tablet (324 mg total) by mouth daily with breakfast. 08/23/22   Ozell Blunt, MD  FOLIC ACID  PO Take by mouth daily.    [provider]  ondansetron  (ZOFRAN -ODT) 8 MG disintegrating tablet Take 8 mg by mouth every 8 (eight) hours as needed.    [provider]  Spacer/Aero-Holding Chambers (AEROCHAMBER PLUS) inhaler Use as instructed 05/07/20   Senaida Dama, NP  dicyclomine  (BENTYL ) 20 MG tablet Take 1 tablet (20 mg total) by mouth 4 (four) times daily -  before meals and at bedtime. Patient not taking: Reported on 03/30/2023 06/18/20 07/22/20  Wieters, Hallie C, PA-C  famotidine  (PEPCID ) 40 MG tablet Take 1 tablet (40 mg total) by mouth daily. Patient not taking: Reported on 07/17/2020 06/18/20 07/22/20  Wieters, Hallie C, PA-C  lisinopril  (ZESTRIL ) 5 MG tablet Take 1 tablet (5 mg total) by mouth daily. Patient not taking: Reported on 07/17/2020 05/07/20 07/22/20  Senaida Dama, NP  sertraline  (ZOLOFT ) 25 MG tablet Take 1 tablet (25 mg total) by mouth at bedtime. Patient not taking: Reported on 07/17/2020 05/07/20 07/22/20  Senaida Dama, NP    Family History Family History  Problem Relation Age of Onset   Diabetes Mother    Breast cancer Mother    Diabetes Father     Social History Social History   Tobacco Use   Smoking status: Never   Smokeless tobacco: Never  Vaping Use   Vaping status: Never Used  Substance Use Topics   Alcohol use: Yes    Alcohol/week: 1.0 standard drink of alcohol    Types: 1 Glasses of wine per week    Comment: occassionally   Drug use: Yes    Types: Marijuana     Allergies   Vancomycin    Review of Systems Review of Systems  Constitutional:  Negative for chills, diaphoresis, fatigue and fever.  Gastrointestinal:  Negative for nausea and vomiting.  Musculoskeletal:  Positive for arthralgias. Negative for gait problem, joint swelling and neck pain.  All other systems reviewed and are negative.    Physical Exam Triage Vital Signs ED Triage Vitals  Encounter Vitals Group     BP 06/07/23 1548 120/78     Systolic BP Percentile --      Diastolic BP Percentile --      Pulse Rate 06/07/23 1548 82     Resp 06/07/23 1548 18     Temp 06/07/23 1548 99.3 F (37.4 C)     Temp Source 06/07/23 1548 Oral     SpO2 --      Weight 06/07/23 1546 150 lb (68 kg)     Height 06/07/23 1546 5\' 1"  (1.549 m)     Head  Circumference --      Peak Flow --      Pain Score 06/07/23 1542 10     Pain Loc --      Pain Education --      Exclude from Growth Chart --    No data found.  Updated Vital Signs BP 119/76 (BP Location: Left Arm)   Pulse 84   Temp 98.4 F (36.9 C) (Oral)   Resp 18  Ht 5\' 1"  (1.549 m)   Wt 150 lb (68 kg)   BMI 28.34 kg/m   Visual Acuity Right Eye Distance:   Left Eye Distance:   Bilateral Distance:    Right Eye Near:   Left Eye Near:    Bilateral Near:     Physical Exam Vitals reviewed.  Constitutional:      General: She is awake. She is not in acute distress.    Appearance: Normal appearance. She is well-developed. She is not ill-appearing, toxic-appearing or diaphoretic.     Comments: Patient is uncomfortable and tearful due to pain   HENT:     Head: Normocephalic.     Mouth/Throat:     Mouth: Mucous membranes are moist.  Eyes:     Conjunctiva/sclera: Conjunctivae normal.  Cardiovascular:     Rate and Rhythm: Normal rate and regular rhythm.     Heart sounds: Normal heart sounds.  Pulmonary:     Effort: Pulmonary effort is normal.     Breath sounds: Normal breath sounds.  Musculoskeletal:     Right shoulder: Tenderness and bony tenderness present. No swelling, deformity, effusion, laceration or crepitus. Decreased range of motion. Decreased strength.     Right upper arm: Normal.     Right elbow: Normal.     Right forearm: Normal.     Right wrist: Normal.     Right hand: Normal.     Comments: Limited range of motion to the right upper extremity due to pain. No signs of effusion, warmth, or redness.   Skin:    General: Skin is warm and dry.  Neurological:     General: No focal deficit present.     Mental Status: She is alert and oriented to person, place, and time.     Sensory: Sensation is intact. No sensory deficit.     Motor: Weakness present.     Gait: Gait is intact.     Comments: Mildly reduced grip strength noted of right hand. Sensation intact.  Normal cap refill.   Psychiatric:        Behavior: Behavior is cooperative.      UC Treatments / Results  Labs (all labs ordered are listed, but only abnormal results are displayed) Labs Reviewed - No data to display  EKG   Radiology No results found.  Procedures Procedures (including critical care time)  Medications Ordered in UC Medications  ketorolac  (TORADOL ) injection 60 mg (60 mg Intramuscular Given 06/07/23 1658)  dexamethasone  (DECADRON ) injection 10 mg (10 mg Intramuscular Given 06/07/23 1701)    Initial Impression / Assessment and Plan / UC Course  I have reviewed the triage vital signs and the nursing notes.  Pertinent labs & imaging results that were available during my care of the patient were reviewed by me and considered in my medical decision making (see chart for details).    43 year old female presenting with a one-week history of worsening right shoulder pain with radiation into the right upper arm. There is no reported injury or prior history of shoulder issues. The pain is aggravated by movement, use, and touch. She reports some subjective weakness but denies numbness, tingling, loss of sensation, or loss of motion in the shoulder or right upper extremity. Supportive care measures at home have not provided relief. On exam, the patient is tearful and uncomfortable due to pain but appears nontoxic. Physical exam findings include limited range of motion in the right upper extremity due to pain, with no signs of effusion, warmth,  or redness. Sensation is intact, though mild reduction in grip strength on the right is noted. Imaging is not available at this facility today. The patient was given the option to obtain imaging at another location or return tomorrow for imaging and elected to return here tomorrow. In the interim, intramuscular Toradol  and Decadron  were administered in the clinic. Prescriptions for naproxen , gabapentin , and oxycodone  were provided. The patient  was advised to avoid aspirin and other NSAIDs while on naproxen , alternate ice and heat to the shoulder, and move the shoulder gently to help prevent frozen shoulder. Further management recommendations will be made following tomorrow's imaging results.  Today's evaluation has revealed no signs of a dangerous process. Discussed diagnosis with patient and/or guardian. Patient and/or guardian aware of their diagnosis, possible red flag symptoms to watch out for and need for close follow up. Patient and/or guardian understands verbal and written discharge instructions. Patient and/or guardian comfortable with plan and disposition.  Patient and/or guardian has a clear mental status at this time, good insight into illness (after discussion and teaching) and has clear judgment to make decisions regarding their care  Documentation was completed with the aid of voice recognition software. Transcription may contain typographical errors. Final Clinical Impressions(s) / UC Diagnoses   Final diagnoses:  Acute pain of right shoulder     Discharge Instructions      You were seen today for pain in your shoulder. You did not report any injury or prior shoulder problems. Right now, the cause of your pain is unclear. Your situation is a little more complex because of your recent history of low white blood cell count, a positive ANA, and joint pain in other areas.  We were unable to perform an X-ray today because an Dentist was not available. However, the order has already been placed, and you should return to the clinic tomorrow to have the X-ray done.  Today, you received an injection of a steroid called Decadron  and an anti-inflammatory medication called Toradol . You were also prescribed gabapentin , naproxen , and oxycodone  for ongoing pain management.  Take your medications exactly as directed. Do not take any over-the-counter aspirin, Motrin , ibuprofen , or Aleve  while you are taking naproxen .  To  help manage your pain and reduce inflammation, use ice and heat on the sore area throughout the day, switching between the two. Always place a towel between the ice and your skin--never apply ice directly to your skin.  Avoid any heavy lifting or pulling with your affected arm. However, it is important to gently move your shoulder often to prevent stiffness or frozen shoulder. Additional recommendations will be made after your X-ray is completed and based on the results of the other tests being done by your doctors at [facility] and the [facility]     ED Prescriptions     Medication Sig Dispense Auth. Provider   gabapentin  (NEURONTIN ) 100 MG capsule Take 1 capsule (100 mg total) by mouth 3 (three) times daily. 30 capsule Maryruth Sol, FNP   oxyCODONE  (ROXICODONE ) 5 MG immediate release tablet Take 1 tablet (5 mg total) by mouth every 4 (four) hours as needed for severe pain (pain score 7-10). 12 tablet Caileigh Canche, [place], FNP   naproxen  (NAPROSYN ) 500 MG tablet Take 1 tablet (500 mg total) by mouth 2 (two) times daily with a meal. 20 tablet Maryruth Sol, FNP      I have reviewed the PDMP during this encounter.   Maryruth Sol, [place] 06/07/23 [phone]

## 2023-06-07 NOTE — ED Triage Notes (Addendum)
"  About a week ago my right shoulder started hurting and now that pain is extending down my right arm and sometimes right arm pit, my BP goes up and down sometimes and makes me feel light headed at times, not right now". "I also have had a PCP visit recently and told I have Arthritis in my joints".

## 2023-06-07 NOTE — ED Notes (Signed)
 Patient is resting comfortably. No Concerns. Will be discharged following AVS availability.

## 2023-06-07 NOTE — Discharge Instructions (Addendum)
 You were seen today for pain in your shoulder. You did not report any injury or prior shoulder problems. Right now, the cause of your pain is unclear. Your situation is a little more complex because of your recent history of low white blood cell count, a positive ANA, and joint pain in other areas.  We were unable to perform an X-ray today because an Dentist was not available. However, the order has already been placed, and you should return to the clinic tomorrow to have the X-ray done.  Today, you received an injection of a steroid called Decadron  and an anti-inflammatory medication called Toradol . You were also prescribed gabapentin , naproxen , and oxycodone  for ongoing pain management.  Take your medications exactly as directed. Do not take any over-the-counter aspirin, Motrin , ibuprofen , or Aleve  while you are taking naproxen .  To help manage your pain and reduce inflammation, use ice and heat on the sore area throughout the day, switching between the two. Always place a towel between the ice and your skin--never apply ice directly to your skin.  Avoid any heavy lifting or pulling with your affected arm. However, it is important to gently move your shoulder often to prevent stiffness or frozen shoulder. Additional recommendations will be made after your X-ray is completed and based on the results of the other tests being done by your doctors at [facility] and the [facility].

## 2023-06-07 NOTE — ED Notes (Signed)
 Pt given snack. States feels shaky after meds given. Provider made aware.

## 2023-06-07 NOTE — ED Notes (Signed)
 Vital signs stable.

## 2023-06-08 ENCOUNTER — Telehealth: Payer: Self-pay | Admitting: Family Medicine

## 2023-06-08 ENCOUNTER — Ambulatory Visit (INDEPENDENT_AMBULATORY_CARE_PROVIDER_SITE_OTHER)

## 2023-06-08 ENCOUNTER — Ambulatory Visit
Admission: EM | Admit: 2023-06-08 | Discharge: 2023-06-08 | Disposition: A | Discharge status: Home / Self Care | Attending: Family Medicine | Admitting: Family Medicine

## 2023-06-08 DIAGNOSIS — M25511 Pain in right shoulder: Secondary | ICD-10-CM

## 2023-06-08 MED ORDER — ONDANSETRON 8 MG PO TBDP: 8.0000 mg | ORAL_TABLET | Freq: Three times a day (TID) | ORAL | 0 refills | Status: AC | PRN

## 2023-06-08 NOTE — ED Provider Notes (Signed)
 Patient here for x-ray only. Seen yesterday. Order unable to be released by ordering provider from yesterday. Order for right shoulder x-ray re-ordered today   Amy Carmine, NP 06/08/23 1029

## 2023-06-08 NOTE — Telephone Encounter (Signed)
 Pt here today for x-ray only. Was seen yesterday, was suppose to receive a refill of Zofran .   I agreed to resend Zofran .

## 2023-06-09 ENCOUNTER — Encounter: Payer: Self-pay | Admitting: Family Medicine

## 2023-06-09 ENCOUNTER — Ambulatory Visit

## 2023-07-07 ENCOUNTER — Ambulatory Visit: Admitting: Certified Nurse Midwife

## 2023-08-06 ENCOUNTER — Encounter: Payer: BC Managed Care – PPO | Admitting: Rheumatology

## 2023-08-12 ENCOUNTER — Ambulatory Visit
Admission: RE | Admit: 2023-08-12 | Discharge: 2023-08-12 | Disposition: A | Discharge status: Home / Self Care | Source: Ambulatory Visit

## 2023-08-12 DIAGNOSIS — M25511 Pain in right shoulder: Secondary | ICD-10-CM

## 2023-08-12 MED ORDER — PREDNISONE 20 MG PO TABS: 40.0000 mg | ORAL_TABLET | Freq: Every day | ORAL | 0 refills | Status: AC

## 2023-08-12 NOTE — ED Triage Notes (Signed)
 My right shoulder is hurting, I had originally been seen in April for this and it has started back again, ? Related to work injury, hurts to lift my right arm.

## 2023-08-12 NOTE — ED Provider Notes (Signed)
 [name]    CSN: [id number] Arrival date & time: 08/12/23  0855      History   Chief Complaint Chief Complaint  Patient presents with   Shoulder Pain    HPI Amy Guerrero is a 43 y.o. female.   Patient here today for evaluation of right shoulder pain that is been ongoing for several months.  She reports that she started to have pain returned again over the last few days.  She reports she has pain with lifting her right arm above shoulder height in any direction.  She has not had any numbness but does feel some weakness.  She does not report treatment for symptoms.  The history is provided by the patient.  Shoulder Pain Associated symptoms: no fever     Past Medical History:  Diagnosis Date   Asthma    Heart murmur    Sinusitis     Patient Active Problem List   Diagnosis Date Noted   Skin sensation disturbance 06/07/2023   Leucopenia 06/07/2023   Lymphedema 08/22/2022   Nerve damage 08/22/2022   Dog bite 08/21/2022   Pain in finger of right hand 07/07/2022   Essential hypertension 05/07/2020   Anxiety and depression 05/07/2020    Past Surgical History:  Procedure Laterality Date   CARPAL TUNNEL RELEASE Right    TUBAL LIGATION      OB History     Gravida  4   Para  4   Term  4   Preterm  0   AB  0   Living  4      SAB  0   IAB  0   Ectopic  0   Multiple  0   Live Births  4            Home Medications    Prior to Admission medications   Medication Sig Start Date End Date Taking? Authorizing Provider  ferrous gluconate  (FERGON) 324 MG tablet Take 1 tablet (324 mg total) by mouth daily with breakfast. 08/23/22  Yes Drusilla, Sabas RAMAN, MD  FOLIC ACID  PO Take by mouth daily.   Yes [provider]  naproxen  (NAPROSYN ) 500 MG tablet Take 1 tablet (500 mg total) by mouth 2 (two) times daily with a meal. 06/07/23  Yes Iola Lukes, FNP  oxyCODONE  (ROXICODONE ) 5 MG immediate release tablet Take 1 tablet (5 mg total)  by mouth every 4 (four) hours as needed for severe pain (pain score 7-10). 06/07/23  Yes Iola Lukes, FNP  predniSONE  (DELTASONE ) 20 MG tablet Take 2 tablets (40 mg total) by mouth daily with breakfast for 5 days. 08/12/23 08/17/23 Yes Billy Asberry FALCON, PA-C  Vitamin D, Ergocalciferol, (DRISDOL) 1.25 MG (50000 UNIT) CAPS capsule Take 50,000 Units by mouth every 7 (seven) days. 08/06/23  Yes [provider]  albuterol  (PROVENTIL ) (2.5 MG/3ML) 0.083% nebulizer solution Take 3 mLs (2.5 mg total) by nebulization every 6 (six) hours as needed for wheezing or shortness of breath. 07/31/22   Lorren Greig PARAS, NP  albuterol  (VENTOLIN  HFA) 108 (90 Base) MCG/ACT inhaler Inhale 2 puffs into the lungs every 6 (six) hours as needed for wheezing or shortness of breath. 02/08/23   Lorren Greig PARAS, NP  Cetirizine  HCl 10 MG CAPS Take 1 capsule (10 mg total) by mouth daily. Patient not taking: Reported on 03/30/2023 12/26/21 03/26/22  Lorren Greig PARAS, NP  gabapentin  (NEURONTIN ) 100 MG capsule Take 1 capsule (100 mg total) by mouth 3 (three) times  daily. 06/07/23   Murrill, Samantha, FNP  ondansetron  (ZOFRAN -ODT) 8 MG disintegrating tablet Take 1 tablet (8 mg total) by mouth every 8 (eight) hours as needed. 06/08/23   Arloa Suzen RAMAN, NP  Spacer/Aero-Holding Chambers (AEROCHAMBER PLUS) inhaler Use as instructed 05/07/20   Lorren Greig PARAS, NP  dicyclomine  (BENTYL ) 20 MG tablet Take 1 tablet (20 mg total) by mouth 4 (four) times daily -  before meals and at bedtime. Patient not taking: Reported on 03/30/2023 06/18/20 07/22/20  Wieters, Hallie C, PA-C  famotidine  (PEPCID ) 40 MG tablet Take 1 tablet (40 mg total) by mouth daily. Patient not taking: Reported on 07/17/2020 06/18/20 07/22/20  Wieters, Hallie C, PA-C  lisinopril  (ZESTRIL ) 5 MG tablet Take 1 tablet (5 mg total) by mouth daily. Patient not taking: Reported on 07/17/2020 05/07/20 07/22/20  Lorren Greig PARAS, NP  sertraline  (ZOLOFT ) 25 MG tablet Take 1 tablet (25 mg total) by  mouth at bedtime. Patient not taking: Reported on 07/17/2020 05/07/20 07/22/20  Lorren Greig PARAS, NP    Family History Family History  Problem Relation Age of Onset   Diabetes Mother    Breast cancer Mother    Diabetes Father     Social History Social History   Tobacco Use   Smoking status: Never   Smokeless tobacco: Never  Vaping Use   Vaping status: Never Used  Substance Use Topics   Alcohol use: Yes    Alcohol/week: 1.0 standard drink of alcohol    Types: 1 Glasses of wine per week    Comment: occassionally   Drug use: Yes    Types: Marijuana     Allergies   Vancomycin    Review of Systems Review of Systems  Constitutional:  Negative for chills and fever.  Eyes:  Negative for discharge and redness.  Respiratory:  Negative for shortness of breath.   Gastrointestinal:  Negative for abdominal pain, nausea and vomiting.  Musculoskeletal:  Positive for arthralgias.  Neurological:  Positive for weakness. Negative for numbness.     Physical Exam Triage Vital Signs ED Triage Vitals  Encounter Vitals Group     BP 08/12/23 0911 112/69     Girls Systolic BP Percentile --      Girls Diastolic BP Percentile --      Boys Systolic BP Percentile --      Boys Diastolic BP Percentile --      Pulse Rate 08/12/23 0911 85     Resp 08/12/23 0911 18     Temp 08/12/23 0911 98.3 F (36.8 C)     Temp Source 08/12/23 0911 Oral     SpO2 08/12/23 0911 97 %     Weight 08/12/23 0907 151 lb (68.5 kg)     Height 08/12/23 0907 5' 1 (1.549 m)     Head Circumference --      Peak Flow --      Pain Score 08/12/23 0905 10     Pain Loc --      Pain Education --      Exclude from Growth Chart --    No data found.  Updated Vital Signs BP 112/69 (BP Location: Left Arm)   Pulse 85   Temp 98.3 F (36.8 C) (Oral)   Resp 18   Ht 5' 1 (1.549 m)   Wt 151 lb (68.5 kg)   LMP 07/24/2023 (Approximate)   SpO2 97%   BMI 28.53 kg/m   Visual Acuity Right Eye Distance:   Left Eye Distance:  Bilateral Distance:    Right Eye Near:   Left Eye Near:    Bilateral Near:     Physical Exam Vitals and nursing note reviewed.  Constitutional:      General: She is not in acute distress.    Appearance: Normal appearance. She is not ill-appearing.  HENT:     Head: Normocephalic and atraumatic.   Eyes:     Conjunctiva/sclera: Conjunctivae normal.    Cardiovascular:     Rate and Rhythm: Normal rate.  Pulmonary:     Effort: Pulmonary effort is normal. No respiratory distress.   Musculoskeletal:     Comments: Decreased range of motion of right arm in all directions due to pain in shoulder anteriorly.  Tenderness palpation noted to anterior shoulder diffusely, no tenderness to posterior or lateral shoulder.   Skin:    Capillary Refill: Normal cap refill to right fingers  Neurological:     Mental Status: She is alert.     Comments: Gross sensation intact to distal right fingers with normal grip strength bilaterally  Psychiatric:        Mood and Affect: Mood normal.        Behavior: Behavior normal.        Thought Content: Thought content normal.      UC Treatments / Results  Labs (all labs ordered are listed, but only abnormal results are displayed) Labs Reviewed - No data to display  EKG   Radiology No results found.  Procedures Procedures (including critical care time)  Medications Ordered in UC Medications - No data to display  Initial Impression / Assessment and Plan / UC Course  I have reviewed the triage vital signs and the nursing notes.  Pertinent labs & imaging results that were available during my care of the patient were reviewed by me and considered in my medical decision making (see chart for details).    Recommended further evaluation by Ortho.  Will treat with steroid burst in the meantime to hopefully decrease inflammation and improve pain.  Final Clinical Impressions(s) / UC Diagnoses   Final diagnoses:  Acute pain of right shoulder    Discharge Instructions   None    ED Prescriptions     Medication Sig Dispense Auth. Provider   predniSONE  (DELTASONE ) 20 MG tablet Take 2 tablets (40 mg total) by mouth daily with breakfast for 5 days. 10 tablet Billy Asberry FALCON, PA-C      PDMP not reviewed this encounter.   Billy Asberry FALCON, PA-C 08/12/23 [phone]

## 2023-08-26 ENCOUNTER — Other Ambulatory Visit: Payer: Self-pay | Admitting: Rheumatology

## 2023-08-26 DIAGNOSIS — M25511 Pain in right shoulder: Secondary | ICD-10-CM

## 2023-09-01 ENCOUNTER — Other Ambulatory Visit: Payer: Self-pay | Admitting: Rheumatology

## 2023-09-01 DIAGNOSIS — M25511 Pain in right shoulder: Secondary | ICD-10-CM

## 2023-09-09 ENCOUNTER — Ambulatory Visit: Payer: BC Managed Care – PPO | Admitting: Rheumatology

## 2023-09-10 ENCOUNTER — Ambulatory Visit: Payer: BC Managed Care – PPO | Admitting: Rheumatology

## 2023-09-24 ENCOUNTER — Other Ambulatory Visit

## 2023-09-24 ENCOUNTER — Ambulatory Visit
Admission: RE | Admit: 2023-09-24 | Discharge: 2023-09-24 | Disposition: A | Discharge status: Home / Self Care | Source: Ambulatory Visit | Attending: Rheumatology | Admitting: Rheumatology

## 2023-09-24 DIAGNOSIS — M25511 Pain in right shoulder: Secondary | ICD-10-CM

## 2023-10-04 ENCOUNTER — Other Ambulatory Visit: Payer: Self-pay | Admitting: Family

## 2023-10-04 DIAGNOSIS — Z1231 Encounter for screening mammogram for malignant neoplasm of breast: Secondary | ICD-10-CM

## 2023-10-15 ENCOUNTER — Encounter

## 2023-10-15 DIAGNOSIS — Z1231 Encounter for screening mammogram for malignant neoplasm of breast: Secondary | ICD-10-CM

## 2023-12-06 ENCOUNTER — Ambulatory Visit: Admitting: Internal Medicine

## 2023-12-06 ENCOUNTER — Other Ambulatory Visit

## 2023-12-08 ENCOUNTER — Inpatient Hospital Stay

## 2023-12-08 ENCOUNTER — Telehealth: Payer: Self-pay

## 2023-12-08 ENCOUNTER — Inpatient Hospital Stay: Admitting: Internal Medicine

## 2023-12-08 NOTE — Telephone Encounter (Signed)
 The patient needs her appointment rescheduled due to work conflict. She says she left a voicemail for us  to reschedule her appointment(s) today.

## 2023-12-20 ENCOUNTER — Encounter: Payer: Self-pay | Admitting: Radiology

## 2024-01-06 ENCOUNTER — Encounter: Payer: BC Managed Care – PPO | Admitting: Family

## 2024-01-06 ENCOUNTER — Telehealth: Payer: Self-pay | Admitting: Emergency Medicine

## 2024-01-06 NOTE — Telephone Encounter (Signed)
 Copied from CRM [id number]. Topic: General - Other >> Jan 06, 2024  9:14 AM Antony RAMAN wrote: Reason for CRM: wanting to inform the doctor she's having surgery on her right shoulder on 01/17/24

## 2024-01-06 NOTE — Progress Notes (Signed)
 Erroneous encounter-disregard

## 2024-01-07 NOTE — Telephone Encounter (Signed)
 Noted

## 2024-03-17 ENCOUNTER — Encounter: Payer: Self-pay | Admitting: Family

## 2024-07-21 ENCOUNTER — Encounter: Admitting: Family
